# Patient Record
Sex: Male | Born: 1955 | Race: White | Hispanic: No | Marital: Married | Smoking: Former smoker
Health system: Southern US, Community
[De-identification: ages and names within clinical notes are randomized; demographics above are authoritative.]

## PROBLEM LIST (undated history)

## (undated) DIAGNOSIS — E669 Obesity, unspecified: Secondary | ICD-10-CM

## (undated) DIAGNOSIS — I10 Essential (primary) hypertension: Secondary | ICD-10-CM

## (undated) DIAGNOSIS — I779 Disorder of arteries and arterioles, unspecified: Secondary | ICD-10-CM

## (undated) DIAGNOSIS — G4730 Sleep apnea, unspecified: Secondary | ICD-10-CM

## (undated) DIAGNOSIS — I4891 Unspecified atrial fibrillation: Secondary | ICD-10-CM

## (undated) DIAGNOSIS — I499 Cardiac arrhythmia, unspecified: Secondary | ICD-10-CM

## (undated) DIAGNOSIS — J449 Chronic obstructive pulmonary disease, unspecified: Secondary | ICD-10-CM

## (undated) DIAGNOSIS — N200 Calculus of kidney: Secondary | ICD-10-CM

## (undated) DIAGNOSIS — K635 Polyp of colon: Secondary | ICD-10-CM

## (undated) DIAGNOSIS — E785 Hyperlipidemia, unspecified: Secondary | ICD-10-CM

## (undated) HISTORY — DX: Chronic obstructive pulmonary disease, unspecified: J44.9

## (undated) HISTORY — DX: Unspecified atrial fibrillation: I48.91

## (undated) HISTORY — DX: Cardiac arrhythmia, unspecified: I49.9

## (undated) HISTORY — DX: Essential (primary) hypertension: I10

## (undated) HISTORY — DX: Polyp of colon: K63.5

## (undated) HISTORY — PX: APPENDECTOMY: SHX54

## (undated) HISTORY — DX: Disorder of arteries and arterioles, unspecified: I77.9

## (undated) HISTORY — DX: Sleep apnea, unspecified: G47.30

## (undated) HISTORY — PX: LITHOTRIPSY: SUR834

## (undated) HISTORY — PX: COLONOSCOPY: SHX174

---

## 2014-06-05 DIAGNOSIS — N400 Enlarged prostate without lower urinary tract symptoms: Secondary | ICD-10-CM | POA: Insufficient documentation

## 2014-06-05 DIAGNOSIS — Z720 Tobacco use: Secondary | ICD-10-CM | POA: Insufficient documentation

## 2014-06-05 DIAGNOSIS — E785 Hyperlipidemia, unspecified: Secondary | ICD-10-CM | POA: Diagnosis present

## 2014-06-05 DIAGNOSIS — K219 Gastro-esophageal reflux disease without esophagitis: Secondary | ICD-10-CM | POA: Insufficient documentation

## 2015-12-21 DIAGNOSIS — Z87891 Personal history of nicotine dependence: Secondary | ICD-10-CM

## 2015-12-21 HISTORY — DX: Personal history of nicotine dependence: Z87.891

## 2020-01-12 DIAGNOSIS — E6609 Other obesity due to excess calories: Secondary | ICD-10-CM | POA: Insufficient documentation

## 2020-01-12 DIAGNOSIS — E66812 Obesity, class 2: Secondary | ICD-10-CM | POA: Insufficient documentation

## 2021-05-29 ENCOUNTER — Observation Stay (HOSPITAL_BASED_OUTPATIENT_CLINIC_OR_DEPARTMENT_OTHER)
Admission: EM | Admit: 2021-05-29 | Discharge: 2021-05-31 | Disposition: A | Discharge status: Home / Self Care | Payer: Medicare Other | Attending: Family Medicine | Admitting: Family Medicine

## 2021-05-29 ENCOUNTER — Emergency Department (HOSPITAL_BASED_OUTPATIENT_CLINIC_OR_DEPARTMENT_OTHER): Payer: Medicare Other

## 2021-05-29 ENCOUNTER — Encounter (HOSPITAL_BASED_OUTPATIENT_CLINIC_OR_DEPARTMENT_OTHER): Payer: Self-pay

## 2021-05-29 ENCOUNTER — Other Ambulatory Visit: Payer: Self-pay

## 2021-05-29 DIAGNOSIS — R0602 Shortness of breath: Secondary | ICD-10-CM | POA: Diagnosis not present

## 2021-05-29 DIAGNOSIS — R002 Palpitations: Secondary | ICD-10-CM | POA: Diagnosis not present

## 2021-05-29 DIAGNOSIS — R7309 Other abnormal glucose: Secondary | ICD-10-CM | POA: Diagnosis not present

## 2021-05-29 DIAGNOSIS — Z20822 Contact with and (suspected) exposure to covid-19: Secondary | ICD-10-CM | POA: Diagnosis not present

## 2021-05-29 DIAGNOSIS — R7989 Other specified abnormal findings of blood chemistry: Secondary | ICD-10-CM | POA: Diagnosis present

## 2021-05-29 DIAGNOSIS — I483 Typical atrial flutter: Secondary | ICD-10-CM | POA: Diagnosis not present

## 2021-05-29 DIAGNOSIS — Z91040 Latex allergy status: Secondary | ICD-10-CM | POA: Diagnosis not present

## 2021-05-29 DIAGNOSIS — E785 Hyperlipidemia, unspecified: Secondary | ICD-10-CM | POA: Diagnosis not present

## 2021-05-29 DIAGNOSIS — I4891 Unspecified atrial fibrillation: Secondary | ICD-10-CM | POA: Insufficient documentation

## 2021-05-29 DIAGNOSIS — I4892 Unspecified atrial flutter: Secondary | ICD-10-CM | POA: Diagnosis not present

## 2021-05-29 DIAGNOSIS — J9811 Atelectasis: Secondary | ICD-10-CM | POA: Diagnosis not present

## 2021-05-29 HISTORY — DX: Obesity, unspecified: E66.9

## 2021-05-29 HISTORY — DX: Hyperlipidemia, unspecified: E78.5

## 2021-05-29 HISTORY — DX: Calculus of kidney: N20.0

## 2021-05-29 HISTORY — DX: Unspecified atrial flutter: I48.92

## 2021-05-29 LAB — CBC
HCT: 48.5 % (ref 39.0–52.0)
Hemoglobin: 16 g/dL (ref 13.0–17.0)
MCH: 29.9 pg (ref 26.0–34.0)
MCHC: 33 g/dL (ref 30.0–36.0)
MCV: 90.5 fL (ref 80.0–100.0)
Platelets: 253 10*3/uL (ref 150–400)
RBC: 5.36 MIL/uL (ref 4.22–5.81)
RDW: 13.5 % (ref 11.5–15.5)
WBC: 10.2 10*3/uL (ref 4.0–10.5)
nRBC: 0 % (ref 0.0–0.2)

## 2021-05-29 LAB — HEPATIC FUNCTION PANEL
ALT: 18 U/L (ref 0–44)
AST: 18 U/L (ref 15–41)
Albumin: 3.7 g/dL (ref 3.5–5.0)
Alkaline Phosphatase: 78 U/L (ref 38–126)
Bilirubin, Direct: 0.1 mg/dL (ref 0.0–0.2)
Indirect Bilirubin: 0.3 mg/dL (ref 0.3–0.9)
Total Bilirubin: 0.4 mg/dL (ref 0.3–1.2)
Total Protein: 6.5 g/dL (ref 6.5–8.1)

## 2021-05-29 LAB — RESP PANEL BY RT-PCR (FLU A&B, COVID) ARPGX2
Influenza A by PCR: NEGATIVE
Influenza B by PCR: NEGATIVE
SARS Coronavirus 2 by RT PCR: NEGATIVE

## 2021-05-29 LAB — BASIC METABOLIC PANEL
Anion gap: 8 (ref 5–15)
BUN: 23 mg/dL (ref 8–23)
CO2: 26 mmol/L (ref 22–32)
Calcium: 9.2 mg/dL (ref 8.9–10.3)
Chloride: 104 mmol/L (ref 98–111)
Creatinine, Ser: 1.08 mg/dL (ref 0.61–1.24)
GFR, Estimated: >60 mL/min (ref >60)
Glucose, Bld: 126 mg/dL — ABNORMAL HIGH (ref 70–99)
Potassium: 4.6 mmol/L (ref 3.5–5.1)
Sodium: 138 mmol/L (ref 135–145)

## 2021-05-29 LAB — BRAIN NATRIURETIC PEPTIDE: B Natriuretic Peptide: 285.6 pg/mL — ABNORMAL HIGH (ref 0.0–100.0)

## 2021-05-29 LAB — TROPONIN I (HIGH SENSITIVITY)
Troponin I (High Sensitivity): 7 ng/L (ref <18)
Troponin I (High Sensitivity): 6 ng/L (ref <18)

## 2021-05-29 LAB — MAGNESIUM: Magnesium: 2 mg/dL (ref 1.7–2.4)

## 2021-05-29 LAB — TSH: TSH: 6.227 u[IU]/mL — ABNORMAL HIGH (ref 0.350–4.500)

## 2021-05-29 MED ORDER — SODIUM CHLORIDE 0.9% FLUSH
3.0000 mL | Freq: Two times a day (BID) | INTRAVENOUS | Status: DC
  Administered 2021-05-29 – 2021-05-30 (×2): 3 mL via INTRAVENOUS

## 2021-05-29 MED ORDER — SODIUM CHLORIDE 0.9 % IV SOLN
250.0000 mL | INTRAVENOUS | Status: DC | PRN
  Administered 2021-05-31: 250 mL via INTRAVENOUS

## 2021-05-29 MED ORDER — APIXABAN 5 MG PO TABS
5.0000 mg | ORAL_TABLET | Freq: Two times a day (BID) | ORAL | Status: DC
  Administered 2021-05-29 – 2021-05-31 (×5): 5 mg via ORAL
  Filled 2021-05-29 (×4): qty 1

## 2021-05-29 MED ORDER — ONDANSETRON HCL 4 MG/2ML IJ SOLN: 4.0000 mg | Freq: Four times a day (QID) | INTRAMUSCULAR | Status: DC | PRN

## 2021-05-29 MED ORDER — DILTIAZEM HCL-DEXTROSE 125-5 MG/125ML-% IV SOLN (PREMIX)
5.0000 mg/h | INTRAVENOUS | Status: DC
  Administered 2021-05-29: 5 mg/h via INTRAVENOUS
  Administered 2021-05-30 – 2021-05-31 (×2): 10 mg/h via INTRAVENOUS
  Filled 2021-05-29 (×4): qty 125

## 2021-05-29 MED ORDER — FUROSEMIDE 10 MG/ML IJ SOLN
40.0000 mg | Freq: Once | INTRAMUSCULAR | Status: AC
  Administered 2021-05-29: 40 mg via INTRAVENOUS
  Filled 2021-05-29: qty 4

## 2021-05-29 MED ORDER — ACETAMINOPHEN 325 MG PO TABS
650.0000 mg | ORAL_TABLET | ORAL | Status: DC | PRN
Start: 2021-05-29 — End: 2021-05-31

## 2021-05-29 MED ORDER — ENOXAPARIN SODIUM 60 MG/0.6ML IJ SOSY
60.0000 mg | PREFILLED_SYRINGE | INTRAMUSCULAR | Status: DC
  Administered 2021-05-29: 60 mg via SUBCUTANEOUS
  Filled 2021-05-29 (×2): qty 0.6

## 2021-05-29 MED ORDER — SODIUM CHLORIDE 0.9% FLUSH: 3.0000 mL | INTRAVENOUS | Status: DC | PRN

## 2021-05-29 NOTE — ED Triage Notes (Signed)
Pt reports ongoing SOB with palpitations for approximately one week. States the palpitations woke him up at approximately 0730. Denies N/V, diaphoresis, hx of afib. Endorses bilateral feet swelling and epigastric pain.

## 2021-05-29 NOTE — Progress Notes (Signed)
Received a phone call from Facility: [facility]  Requesting MD: Dr. Rush Landmark Patient with h/o BPH, GERD, HLD presenting with shortness of breath, leg swelling, fatigue, palpitations found to be in new aflutter with RVR with rate of 140. Started on dilt drip and HR down to 110. Troponin negative x 1. BNP: 285 and looks edematous.  Plan of care: admit to tele, cardiology already consulted (Dr. Mayford Knife).  I Asked EDP to give one dose of lasix.   Vitals stable with 98% on RR, RR: 13 and HR down to 111.   The patient will be accepted for admission to telemetry at [facility] when bed is available.   Nursing staff, Please call the [facility] Admits & Consults System-Wide number at the top of Amion at the time of the patient's arrival so that the patient can be paged to the admitting physician.   Lanney Gins M.D. [facility]

## 2021-05-29 NOTE — Consult Note (Addendum)
Cardiology Consult    Patient ID: Richard Vazquez MRN: [id number], DOB/AGE: [birth year]   Admit date: 05/29/2021 Date of Consult: 05/29/2021 Requesting Provider: Orland Mustard, MD  PCP:  Katherine Basset, PA-C Cardiologist:  None   {    Patient Profile    Richard Vazquez is a 65 y.o. male with a history of nephrolithiasis requiring surgical intervention, pre-diabetes (A1c 6.1%), anemia, and hyperlipidemia. He is being seen today for the evaluation of atrial flutter.  History of Present Illness    Woke up this morning at 7am with orthopnea/dyspnea, heaviness in his chest, and palpitations like his heart was racing. He has had similar but much more minor episodes last night in particular, but also occasionally while at work for the past few months. He has also noted leg swelling for the past year, worse in the left leg which he attributes to varicose veins, and exertional dyspnea/intolerance for a few months. He works a night shift and gets off around 2am. Denies any alcohol use, but has been sedentary and gained weight in his abdomen. Snores at night and often has un-refreshing sleep.   He went to the ED and was found to be in atrial flutter with a rate of 125 bpm. Mildly hypertensive, oxygenating well. BNP 285, TSH normal. CXR clear. Started on diltiazem infusion and transferred here for cardiology consultation.  Past Medical History   Past Medical History:  Diagnosis Date   Hyperlipemia    Kidney stones    Obesity     Past Surgical History:  Procedure Laterality Date   APPENDECTOMY       Allergies  Allergen Reactions   Codeine Itching   Latex Itching   Inpatient Medications     enoxaparin (LOVENOX) injection  60 mg Subcutaneous Q24H   sodium chloride flush  3 mL Intravenous Q12H    Family History    No family history on file. has no family status information on file.    Social History    Social History   Socioeconomic History   Marital status: Married    Spouse name:  Not on file   Number of children: Not on file   Years of education: Not on file   Highest education level: Not on file  Occupational History   Not on file  Tobacco Use   Smoking status: Not on file   Smokeless tobacco: Not on file  Substance and Sexual Activity   Alcohol use: Not on file   Drug use: Not on file   Sexual activity: Not on file  Other Topics Concern   Not on file  Social History Narrative   Not on file   Social Determinants of Health   Financial Resource Strain: Not on file  Food Insecurity: Not on file  Transportation Needs: Not on file  Physical Activity: Not on file  Stress: Not on file  Social Connections: Not on file  Intimate Partner Violence: Not on file     Review of Systems    A comprehensive review of systems was performed with pertinent positives and negatives noted in the HPI.  Physical Exam    Blood pressure 112/75, pulse 78, temperature 99 F (37.2 C), temperature source Oral, resp. rate 17, height 6\' 3"  (1.905 m), weight 122.5 kg, SpO2 94 %.     Intake/Output Summary (Last 24 hours) at 05/29/2021 1911 Last data filed at 05/29/2021 1828 Gross per 24 hour  Intake 840 ml  Output 2350 ml  Net -1510 ml  Wt Readings from Last 3 Encounters:  05/29/21 122.5 kg    CONSTITUTIONAL: alert and conversant, obese, no distress HEENT/NECK: no JVD, symmetric, no masses, no scleral icterus, pupils equal CARDIAC: irregular rhythm, AFL on monitor. Normal S1/S2, no S3/S4. No murmur. No friction rub. JVP normal.  VASCULAR: Radial pulses intact bilaterally. No carotid bruits. PULMONARY/CHEST WALL: very diminished breath sounds but otherwise clear, normal work of breathing ABDOMINAL: soft, non-tender, non-distended EXTREMITIES: 1+ LE edema (left slightly worse), no muscle atrophy, warm and well-perfused SKIN: Dry and intact. Slight venous stasis changes over distal lower extremities. No peripheral cyanosis. NEUROLOGIC: alert, no abnormal movements, cranial  nerves grossly intact. PSYCH: normal affect, normal speech and language   Labs    Recent Labs    05/29/21 1001 05/29/21 1200  TROPONINIHS 7 6   Lab Results  Component Value Date   WBC 10.2 05/29/2021   HGB 16.0 05/29/2021   HCT 48.5 05/29/2021   MCV 90.5 05/29/2021   PLT 253 05/29/2021    Recent Labs  Lab 05/29/21 1001  NA 138  K 4.6  CL 104  CO2 26  BUN 23  CREATININE 1.08  CALCIUM 9.2  PROT 6.5  BILITOT 0.4  ALKPHOS 78  ALT 18  AST 18  GLUCOSE 126*   No results found for: CHOL, HDL, LDLCALC, TRIG No results found for: DDIMER Recent Labs    05/29/21 1001  BNP 285.6*   No results for input(s): PROBNP in the last 8760 hours.    Radiology Studies    DG Chest Portable 1 View  Result Date: 05/29/2021 CLINICAL DATA:  Shortness of breath and palpitations for 1 week. EXAM: PORTABLE CHEST 1 VIEW COMPARISON:  None. FINDINGS: The heart size and mediastinal contours are within normal limits. There is mild left basilar atelectasis. The right lung is clear. There is no pleural effusion or pneumothorax. Degenerative changes are seen in the spine. IMPRESSION: No active disease. Electronically Signed   By: Romona Curls M.D.   On: 05/29/2021 11:19    ECG & Cardiac Imaging    ECG: typical atrial flutter with mostly 2:1 AV conduction - personally reviewed.  Assessment & Plan    Typical atrial flutter: new onset, now rate controlled on diltiazem ggt. CHADS-VASc is 2 for age and HTN, as he has had multiple pressures in the hypertensive range at clinic visits. He has multiple risk factors for atrial arrhythmias that need to be addressed prior to considering ablation, as he is high risk for conversion to AF. Given symptoms suggestive of other previous episodes, needs TEE.  - Can continue diltiazem infusion for now, okay for some permissive tachycardia if BP becomes limiting. - Start apixaban 5mg  BID and d/c enoxaparin - Agree with TFTs and echo, already ordered - NPO at  midnight for TEE/DCCV tomorrow - Refer for sleep study at discharge (high risk for OSA) - Stop omega 3 supplements (consistently associated with slightly higher risk of AF in RCTs) - Weight loss and exercise, discussed at length - Can be referred at for consideration of definitive CTI ablation at follow-up  Possible heart failure: reports exertional intolerance and lower extremity swelling over the last several months to a year. He reports improvement with lasix given in the ED and looks close to euvolemic with some residual lower extremity edema. May be related to atrial arrhythmias. Will await echo results. May benefit from some additional diuresis, but would hold off until after procedural sedation to avoid hypovolemia.   Hyperlipidemia: prior untreated  LDL was 160 with intermediate ASCVD risk. Has reported minor subjective intolerance to atorvastatin previously, and possibly lovastatin to a lesser degree. He is willing to retry lovastatin at 40mg . Also stressed importance of weight loss and exercise.    Signed, , MD 05/29/2021, 7:11 PM  For questions or updates, please contact   Please consult [email or web address] for contact info under Cardiology/STEMI.

## 2021-05-29 NOTE — H&P (Addendum)
History and Physical    Richard Vazquez WOE:[id number] DOB: 08-Jul-1956 DOA: 05/29/2021  PCP: Katherine Basset, PA-C Consultants:  [facility]: can't remember  Patient coming from: [facility]  lives at home with his wife and 2 kids   Chief Complaint: shortness of breath and palpitations .  HPI: Richard Vazquez is a 65 y.o. male with medical history significant of kidney stones and enlarged right kidney, hyperlipidemia who presented to ED with shortness of breath and palpitations. He states symptoms started yesterday with shortness of breath and palpitations that were on and off. It wasn't very severe and he wasn't very concerned. . This AM he went to the bathroom and had same symptoms witi palpitations and shortness of breath. He had a hard time catching his breath and thought his heart felt funny to the point that he told his wife they needed to go to ED.     He denies any fever/chills, chest pain, cough, stomach pain, N/V/D, headaches/vision changes, dysuria. Has chronic leg swelling that seems to be worse today. Also questionable orthopnea.    ED Course: Vitals: Febrile, blood pressure 140/97, heart rate 125, respiratory rate 20, oxygen 96% on room air. Pertinent labs: BNP 285.6, TSH 6.227, COVID flu negative Chest x-ray shows no active disease. In ED patient placed on diltiazem drip and cardiology was consulted.  [facility] was asked to admit.  Review of Systems: As per HPI; otherwise review of systems reviewed and negative.   Ambulatory Status:  Ambulates without assistance   Past Medical History:  Diagnosis Date   Hyperlipemia    Kidney stones    Obesity     Past Surgical History:  Procedure Laterality Date   APPENDECTOMY      Social History   Socioeconomic History   Marital status: Married    Spouse name: Not on file   Number of children: Not on file   Years of education: Not on file   Highest education level: Not on file  Occupational History   Not on file  Tobacco Use    Smoking status: Not on file   Smokeless tobacco: Not on file  Substance and Sexual Activity   Alcohol use: Not on file   Drug use: Not on file   Sexual activity: Not on file  Other Topics Concern   Not on file  Social History Narrative   Not on file   Social Determinants of Health   Financial Resource Strain: Not on file  Food Insecurity: Not on file  Transportation Needs: Not on file  Physical Activity: Not on file  Stress: Not on file  Social Connections: Not on file  Intimate Partner Violence: Not on file    Allergies  Allergen Reactions   Codeine Itching   Latex Itching    No family history on file.  Prior to Admission medications   Not on File    Physical Exam: Vitals:   05/29/21 1500 05/29/21 1515 05/29/21 1528 05/29/21 1702  BP: 114/73 113/72  112/75  Pulse: 78 94  78  Resp: 19 (!) 22  17  Temp:   98.3 F (36.8 C) 99 F (37.2 C)  TempSrc:   Oral Oral  SpO2: 95% 96%  94%  Weight:      Height:         General:  Appears calm and comfortable and is in NAD Eyes:  PERRL, EOMI, normal lids, iris ENT:  grossly normal hearing, lips & tongue, mmm; appropriate dentition Neck:  no LAD, masses or  thyromegaly; no carotid bruits Cardiovascular:  irregularly,irregular , no m/r/g. Bilateral lower leg edema 2-3+ to mid calf. Left >R.  Respiratory:   CTA bilaterally with no wheezes/rales/rhonchi.  Normal respiratory effort. Abdomen:  soft, NT, ND, NABS. Diastasis recti  Back:   normal alignment, no CVAT Skin:  no rash or induration seen on limited exam. Psoriasis like plaque over knuckles/arms Musculoskeletal:  grossly normal tone BUE/BLE, good ROM, no bony abnormality Lower extremity:   Limited foot exam with no ulcerations.  2+ distal pulses. Varicose vein on left lower calf.  Psychiatric:  grossly normal mood and affect, speech fluent and appropriate, AOx3 Neurologic:  CN 2-12 grossly intact, moves all extremities in coordinated fashion, sensation  intact    Radiological Exams on Admission: Independently reviewed - see discussion in A/P where applicable  DG Chest Portable 1 View  Result Date: 05/29/2021 CLINICAL DATA:  Shortness of breath and palpitations for 1 week. EXAM: PORTABLE CHEST 1 VIEW COMPARISON:  None. FINDINGS: The heart size and mediastinal contours are within normal limits. There is mild left basilar atelectasis. The right lung is clear. There is no pleural effusion or pneumothorax. Degenerative changes are seen in the spine. IMPRESSION: No active disease. Electronically Signed   By: Romona Curls M.D.   On: 05/29/2021 11:19    EKG: Independently reviewed.  Atrial flutter with rate 126, 2:1; nonspecific ST changes with no evidence of acute ischemia. No prior ekg for comparison    Labs on Admission: I have personally reviewed the available labs and imaging studies at the time of the admission.  Pertinent labs:   BNP 285.6,  TSH 6.227,  COVID flu negative Troponin negative x2   Assessment/Plan Principal Problem:   Atrial flutter with rapid ventricular response (HCC) -65 year old male presenting with shortness of breath and palpitations found to be in new onset atrial flutter with RVR -Admit to cardiac telemetry -Continue diltiazem drip for rate control responding well, continues to be in atrial flutter -Echocardiogram pending -CHA2DS2-VASc 2 score of 1: low moderate risk.  Discussed aspirin versus DOAC.  We will continue Lovenox DVT prophylaxis now and have cardiology follow-up on discussion of anticoagulation -Cardiology consulted and following -likely will need EP consult.   Active Problems:   Hyperlipemia -Fasting lipid panel in a.m. on no medication    Abnormal TSH- hypothyroid  -Adding on free T4 to a.m. labs -Repeat TSH in 6 to 8 weeks outpatient  Body mass index is 33.75 kg/m.   Level of care: Progressive DVT prophylaxis:  Lovenox  Code Status:  Full - confirmed with patient Family  Communication: his wife is at bedside: Lamar Benes  Disposition Plan:  The patient is from: home  Anticipated d/c is to: home   Requires inpatient hospitalization and is at significant risk of worsening, requires constant monitoring, assessment and MDM with specialists.  Patient is currently: stable.  Consults called: cardiology  Admission status:  observation   Dragon dictation used in completing this note.    Orland Mustard MD [facility]   How to contact the [facility] Attending or Consulting provider 7A - 7P or covering provider during after hours 7P -7A, for this patient?  Check the care team in [facility] and look for a) attending/consulting [facility] provider listed and b) the [facility] team listed Log into [email or web address] and use [facility] universal password to access. If you do not have the password, please contact the hospital operator. Locate the [facility] provider you are looking for under [facility] and page  to a number that you can be directly reached. If you still have difficulty reaching the provider, please page the [facility] (Director on Call) for the Hospitalists listed on amion for assistance.   05/29/2021, 5:55 PM

## 2021-05-29 NOTE — Progress Notes (Addendum)
Patient admitted to 4 E 12  VSS, call bell and phone within reach, patient assessed, cardiac monitor on and CCMD notified, admitting provider contacted.

## 2021-05-29 NOTE — ED Provider Notes (Signed)
[facility] EMERGENCY DEPARTMENT Provider Note   CSN: [id number] Arrival date & time: 05/29/21  0941     History Chief Complaint  Patient presents with   Palpitations   Shortness of Breath    Richard Vazquez is a 65 y.o. male.  The history is provided by the patient and medical records. No language interpreter was used.  Palpitations Palpitations quality:  Fast Onset quality:  Unable to specify Timing:  Intermittent Progression:  Waxing and waning Chronicity:  New Relieved by:  Nothing Worsened by:  Nothing Ineffective treatments:  None tried Associated symptoms: chest pain, chest pressure, lower extremity edema, malaise/fatigue and shortness of breath   Associated symptoms: no back pain, no cough, no diaphoresis, no dizziness, no hemoptysis, no nausea, no near-syncope, no numbness, no vomiting and no weakness   Shortness of Breath Severity:  Moderate Onset quality:  Gradual Duration:  2 days Timing:  Intermittent Progression:  Waxing and waning Chronicity:  New Relieved by:  Nothing Worsened by:  Nothing Ineffective treatments:  None tried Associated symptoms: chest pain   Associated symptoms: no abdominal pain, no cough, no diaphoresis, no fever, no headaches, no hemoptysis, no neck pain, no rash, no vomiting and no wheezing       Past Medical History:  Diagnosis Date   Hyperlipemia    Kidney stones     There are no problems to display for this patient.   Past Surgical History:  Procedure Laterality Date   APPENDECTOMY         No family history on file.     Home Medications Prior to Admission medications   Not on File    Allergies    Codeine and Latex  Review of Systems   Review of Systems  Constitutional:  Positive for fatigue and malaise/fatigue. Negative for chills, diaphoresis and fever.  HENT:  Negative for congestion.   Eyes:  Negative for visual disturbance.  Respiratory:  Positive for shortness of breath. Negative for cough,  hemoptysis, chest tightness and wheezing.   Cardiovascular:  Positive for chest pain and palpitations. Negative for leg swelling and near-syncope.  Gastrointestinal:  Negative for abdominal pain, constipation, diarrhea, nausea and vomiting.  Genitourinary:  Negative for dysuria, flank pain and frequency.  Musculoskeletal:  Negative for back pain, neck pain and neck stiffness.  Skin:  Negative for rash and wound.  Neurological:  Negative for dizziness, weakness, light-headedness, numbness and headaches.  Psychiatric/Behavioral:  Negative for agitation.   All other systems reviewed and are negative.  Physical Exam Updated Vital Signs BP (!) 140/97   Pulse (!) 125   Temp 98.2 F (36.8 C)   Resp 20   Ht 6\' 3"  (1.905 m)   Wt 122.5 kg   SpO2 96%   BMI 33.75 kg/m   Physical Exam Vitals and nursing note reviewed.  Constitutional:      General: He is not in acute distress.    Appearance: He is well-developed. He is not ill-appearing, toxic-appearing or diaphoretic.  HENT:     Head: Normocephalic and atraumatic.  Eyes:     Conjunctiva/sclera: Conjunctivae normal.     Pupils: Pupils are equal, round, and reactive to light.  Cardiovascular:     Rate and Rhythm: Tachycardia present. Rhythm irregular.     Heart sounds: No murmur heard. Pulmonary:     Effort: Pulmonary effort is normal. Tachypnea present. No respiratory distress.     Breath sounds: Rales present. No decreased breath sounds, wheezing or rhonchi.  Chest:  Chest wall: No tenderness.  Abdominal:     Palpations: Abdomen is soft.     Tenderness: There is no abdominal tenderness.  Musculoskeletal:     Cervical back: Neck supple.     Right lower leg: Edema present.     Left lower leg: Edema present.  Skin:    General: Skin is warm and dry.     Capillary Refill: Capillary refill takes less than 2 seconds.     Coloration: Skin is not pale.     Findings: No erythema.  Neurological:     General: No focal deficit  present.     Mental Status: He is alert.  Psychiatric:        Mood and Affect: Mood normal.    ED Results / Procedures / Treatments   Labs (all labs ordered are listed, but only abnormal results are displayed) Labs Reviewed  BASIC METABOLIC PANEL - Abnormal; Notable for the following components:      Result Value   Glucose, Bld 126 (*)    All other components within normal limits  BRAIN NATRIURETIC PEPTIDE - Abnormal; Notable for the following components:   B Natriuretic Peptide 285.6 (*)    All other components within normal limits  RESP PANEL BY RT-PCR (FLU A&B, COVID) ARPGX2  CBC  HEPATIC FUNCTION PANEL  MAGNESIUM  TSH  TROPONIN I (HIGH SENSITIVITY)  TROPONIN I (HIGH SENSITIVITY)    EKG EKG Interpretation  Date/Time:  Sunday May 29 2021 09:49:04 EDT Ventricular Rate:  126 PR Interval:    QRS Duration: 91 QT Interval:  327 QTC Calculation: 474 R Axis:   -20 Text Interpretation: Atrial flutter with 2:1 AV block RSR' in V1 or V2, right VCD or RVH Inferior infarct, age indeterminate No prior ECG for comparison. No STEMI Confirmed by Theda Belfast ([id number]) on 05/29/2021 9:57:23 AM  Radiology DG Chest Portable 1 View  Result Date: 05/29/2021 CLINICAL DATA:  Shortness of breath and palpitations for 1 week. EXAM: PORTABLE CHEST 1 VIEW COMPARISON:  None. FINDINGS: The heart size and mediastinal contours are within normal limits. There is mild left basilar atelectasis. The right lung is clear. There is no pleural effusion or pneumothorax. Degenerative changes are seen in the spine. IMPRESSION: No active disease. Electronically Signed   By: Romona Curls M.D.   On: 05/29/2021 11:19    Procedures Procedures   Medications Ordered in ED Medications  diltiazem (CARDIZEM) 125 mg in dextrose 5% 125 mL (1 mg/mL) infusion (7.5 mg/hr Intravenous Rate/Dose Change 05/29/21 1148)  furosemide (LASIX) injection 40 mg (40 mg Intravenous Given 05/29/21 1229)    ED Course  I have  reviewed the triage vital signs and the nursing notes.  Pertinent labs & imaging results that were available during my care of the patient were reviewed by me and considered in my medical decision making (see chart for details).    MDM Rules/Calculators/A&P                           Richard Vazquez is a 65 y.o. male with a past medical history significant for previous kidney stones, hyperlipidemia, and previous appendectomy who presents with a week or 2 of fatigue, occasional palpitations, worsening peripheral edema, and then 2 days of acute fast palpitations, chest pain, and worsened shortness of breath.  According to patient, he has not quite felt right for some time and has had occasional palpitations on and off with fatigue for the last  week or 2.  He reports has had edema worsening for the last month or 2 but denies any chest discomfort.  He reports that over the last 2 days he has had multiple episodes of fast palpitations and chest tightness and he has been unable to lay flat due to the shortness of breath.  Today it was worsened prompting him to come in.  On arrival, patient is found to be in A. fib with RVR with a rate in the 130s.  He denies any history of A. fib or a flutter.  He reports that he is not having chest discomfort right now but is still feeling short of breath.  Oxygen saturations were reassuring on initial evaluation.  Patient is denying any constipation, diarrhea, or urinary changes.  Denies fevers, chills, gesturing, or cough.  He denies any trauma.  Denies any other complaints on arrival.  On exam, patient does have significant edema in both legs which she reports is worsening.  He has significant rales in all lung fields but no acute murmur initially.  Good pulses in extremities.  Patient otherwise resting on arrival.  Clinically I am concerned that patient is having symptoms of shortness of breath, fatigue, and chest discomfort related to this new A. fib with RVR.  I suspect  his been in A. fib for some time given the report of fatigue, worsening edema, and occasional palpitations but has now been in RVR several times over the last few days.  We will get screening labs, chest x-ray, BNP, and has we do not have a hard start for when he began his A. fib, I do not feel he is a good candidate for ED cardioversion at this time.  We will start a diltiazem drip to help slow him down.  Due to his overall appearance and symptoms, I do suspect he will need admission.  Anticipate reassessment after work-up.  11:59 AM On the diltiazem drip, his rate is started to slow down to around 100-110.  It clearly shows a flutter like pattern on telemetry now.  COVID and flu negative.  Hepatic function not elevated.  CBC and metabolic panel reassuring.  Initial troponin negative, will trend.  Magnesium is normal.  TSH is still in process but BNP is elevated at 285.  This is likely corresponding to all the edema patient has.  I am concerned about the degree of new heart failure with this fluid overload and new A. fib.  Chest x-ray did not show pneumonia.  Will call cardiology to discuss the best place for patient be admitted for further management of new a flutter/fib with RVR with evidence of new heart failure.  Anticipate reassessment after discussion with cardiology.  12:50 PM Cardiology who feels that patient is appropriate for admission to medicine service and they will see for management.    Patient will be admitted to medicine for further management.  Lasix will be started for assistance with diuresis.    Final Clinical Impression(s) / ED Diagnoses Final diagnoses:  Atrial fibrillation with RVR (HCC)  Palpitations  Shortness of breath      Clinical Impression: 1. Atrial fibrillation with RVR (HCC)   2. Palpitations   3. Shortness of breath     Disposition: Discharge  Condition: Good  I have discussed the results, Dx and Tx plan with the pt(& family if present).  He/she/they expressed understanding and agree(s) with the plan. Discharge instructions discussed at great length. Strict return precautions discussed and pt &/or family have  verbalized understanding of the instructions. No further questions at time of discharge.    New Prescriptions   No medications on file    Follow Up: No follow-up provider specified.    Pedram Goodchild, Canary Brim, MD 05/29/21 1251

## 2021-05-30 ENCOUNTER — Other Ambulatory Visit (HOSPITAL_COMMUNITY): Payer: Self-pay

## 2021-05-30 ENCOUNTER — Observation Stay (HOSPITAL_BASED_OUTPATIENT_CLINIC_OR_DEPARTMENT_OTHER): Payer: Medicare Other

## 2021-05-30 ENCOUNTER — Encounter (HOSPITAL_COMMUNITY): Payer: Self-pay | Admitting: Family Medicine

## 2021-05-30 DIAGNOSIS — I4891 Unspecified atrial fibrillation: Secondary | ICD-10-CM | POA: Diagnosis not present

## 2021-05-30 DIAGNOSIS — I4892 Unspecified atrial flutter: Secondary | ICD-10-CM | POA: Diagnosis not present

## 2021-05-30 LAB — HEMOGLOBIN A1C
Hgb A1c MFr Bld: 6.1 % — ABNORMAL HIGH (ref 4.8–5.6)
Mean Plasma Glucose: 128.37 mg/dL

## 2021-05-30 LAB — CBC
HCT: 48 % (ref 39.0–52.0)
Hemoglobin: 15.8 g/dL (ref 13.0–17.0)
MCH: 30.2 pg (ref 26.0–34.0)
MCHC: 32.9 g/dL (ref 30.0–36.0)
MCV: 91.8 fL (ref 80.0–100.0)
Platelets: 279 10*3/uL (ref 150–400)
RBC: 5.23 MIL/uL (ref 4.22–5.81)
RDW: 13.5 % (ref 11.5–15.5)
WBC: 12.5 10*3/uL — ABNORMAL HIGH (ref 4.0–10.5)
nRBC: 0 % (ref 0.0–0.2)

## 2021-05-30 LAB — BASIC METABOLIC PANEL
Anion gap: 10 (ref 5–15)
BUN: 19 mg/dL (ref 8–23)
CO2: 27 mmol/L (ref 22–32)
Calcium: 9 mg/dL (ref 8.9–10.3)
Chloride: 100 mmol/L (ref 98–111)
Creatinine, Ser: 1.1 mg/dL (ref 0.61–1.24)
GFR, Estimated: >60 mL/min (ref >60)
Glucose, Bld: 119 mg/dL — ABNORMAL HIGH (ref 70–99)
Potassium: 3.6 mmol/L (ref 3.5–5.1)
Sodium: 137 mmol/L (ref 135–145)

## 2021-05-30 LAB — ECHOCARDIOGRAM COMPLETE
Height: 75 in
S' Lateral: 2.7 cm
Weight: 4347.2 oz

## 2021-05-30 LAB — HIV ANTIBODY (ROUTINE TESTING W REFLEX): HIV Screen 4th Generation wRfx: NONREACTIVE

## 2021-05-30 LAB — PROTIME-INR
INR: 1.1 (ref 0.8–1.2)
Prothrombin Time: 14.5 seconds (ref 11.4–15.2)

## 2021-05-30 LAB — T4, FREE: Free T4: 1.17 ng/dL — ABNORMAL HIGH (ref 0.61–1.12)

## 2021-05-30 MED ORDER — INFLUENZA VAC A&B SA ADJ QUAD 0.5 ML IM PRSY
0.5000 mL | PREFILLED_SYRINGE | INTRAMUSCULAR | Status: DC
  Filled 2021-05-30: qty 0.5

## 2021-05-30 MED ORDER — PNEUMOCOCCAL VAC POLYVALENT 25 MCG/0.5ML IJ INJ
0.5000 mL | INJECTION | INTRAMUSCULAR | Status: DC
  Filled 2021-05-30: qty 0.5

## 2021-05-30 NOTE — Discharge Instructions (Addendum)
  You will be scheduled for a sleep study from Dr. Charlott Rakes office.  If you have not heard in a week call the office [phone] and ask to speak to [facility]) she schedules these.      Information on my medicine - ELIQUIS (apixaban)  Why was Eliquis prescribed for you? Eliquis was prescribed for you to reduce the risk of a blood clot forming that can cause a stroke if you have a medical condition called atrial fibrillation (a type of irregular heartbeat).  What do You need to know about Eliquis ? Take your Eliquis TWICE DAILY - one tablet in the morning and one tablet in the evening with or without food. If you have difficulty swallowing the tablet whole please discuss with your pharmacist how to take the medication safely.  Take Eliquis exactly as prescribed by your doctor and DO NOT stop taking Eliquis without talking to the doctor who prescribed the medication.  Stopping may increase your risk of developing a stroke.  Refill your prescription before you run out.  After discharge, you should have regular check-up appointments with your healthcare provider that is prescribing your Eliquis.  In the future your dose may need to be changed if your kidney function or weight changes by a significant amount or as you get older.  What do you do if you miss a dose? If you miss a dose, take it as soon as you remember on the same day and resume taking twice daily.  Do not take more than one dose of ELIQUIS at the same time to make up a missed dose.  Important Safety Information A possible side effect of Eliquis is bleeding. You should call your healthcare provider right away if you experience any of the following: Bleeding from an injury or your nose that does not stop. Unusual colored urine (red or dark brown) or unusual colored stools (red or black). Unusual bruising for unknown reasons. A serious fall or if you hit your head (even if there is no bleeding).  Some medicines may  interact with Eliquis and might increase your risk of bleeding or clotting while on Eliquis. To help avoid this, consult your healthcare provider or pharmacist prior to using any new prescription or non-prescription medications, including herbals, vitamins, non-steroidal anti-inflammatory drugs (NSAIDs) and supplements.  This website has more information on Eliquis (apixaban): http://www.eliquis.com/eliquis/home

## 2021-05-30 NOTE — TOC Benefit Eligibility Note (Signed)
Patient Advocate Encounter  Insurance verification completed.    The patient is currently admitted and upon discharge could be taking Eliquis 5 mg.  The current 30 day co-pay is, $37.00.   The patient is currently admitted and upon discharge could be taking Xarelto 20 mg.  The current 30 day co-pay is, $37.00.   The patient is insured through [employer] [place] Medicare Part D     Jamaris Theard, CPhT Pharmacy Patient Advocate Specialist [place] [facility] Team Direct Number: [phone]  Fax: [phone]        

## 2021-05-30 NOTE — Progress Notes (Signed)
PROGRESS NOTE    Richard Vazquez  GUR:[id number] DOB: [birth year] DOA: 05/29/2021 PCP: Katherine Basset, PA-C   Brief Narrative:  This 65 years old male with PMH significant for kidney stones and enlarged right kidney, hyperlipidemia presented to the ED with complaints of intermittent shortness of breath and palpitations.  Patient reported waking up with intermittent palpitation it was not severe so he was not very concerned, later in the day it became worse,  he had to catch his breath and thought his heart felt funny so he came to the ED.  Patient also reported questionable orthopnea and chronic leg swelling.  Patient is found to have A. Flutter with RVR and started on Cardizem drip and cardiology was consulted.  Echocardiogram showed hyperdynamic EF 75%.  No valvular problems.  Cardiology recommended TEE with cardioversion tomorrow with at least 1 month of anticoagulation after procedure.  Assessment & Plan:   Principal Problem:   Atrial flutter with rapid ventricular response (HCC) Active Problems:   Hyperlipemia   Abnormal TSH  New onset atrial flutter with rapid ventricular response: Patient presented with shortness of breath and intermittent palpitations. He is found to have new onset atrial flutter with RVR. Patient is started on Cardizem drip for rate control,  heart rate is now controlled. Continue Cardizem drip for now , Patient is started on Eliquis for anticoagulation. Echocardiogram shows hyperdynamic EF 75%.  No valvular problems. Cardiology recommended TEE with cardioversion tomorrow. Patient reports feeling much improved.  Hyperlipidemia: Obtain lipid profile.  Abnormal TSH: Repeat full thyroid profile.   DVT prophylaxis: Eliquis Code Status: Full code Family Communication: No family at bedside Disposition Plan:   Status is: Observation  The patient remains OBS appropriate and will d/c before 2 midnights.  Dispo: The patient is from: Home               Anticipated d/c is to: Home              Patient currently is not medically stable to d/c.   Difficult to place patient No   Consultants:  Cardiology  Procedures: TTE  antimicrobials: No antibiotics  Subjective: Patient was seen and examined at bedside.  Overnight events noted.   He denies any chest pain.  He reports he is doing better and he is going to have a procedure today.  Objective: Vitals:   05/30/21 0605 05/30/21 0606 05/30/21 0754 05/30/21 1142  BP:   118/65 126/74  Pulse:   76 86  Resp: (!) 26 15 20 20   Temp:   98.7 F (37.1 C) 97.6 F (36.4 C)  TempSrc:   Oral Oral  SpO2:   95% 92%  Weight: 123.2 kg     Height:        Intake/Output Summary (Last 24 hours) at 05/30/2021 1355 Last data filed at 05/30/2021 1140 Gross per 24 hour  Intake 1431 ml  Output 3750 ml  Net -2319 ml   Filed Weights   05/29/21 0953 05/30/21 0605  Weight: 122.5 kg 123.2 kg    Examination:  General exam: Appears comfortable, not in any acute distress. Respiratory system: Clear to auscultation bilaterally, respiratory effort normal, RR 15 Cardiovascular system: S1 S2 heard, irregular rhythm, no murmur.   Gastrointestinal system: Abdomen is soft, nontender but distended, BS + Central nervous system: Alert and oriented X 1. No focal neurological deficits. Extremities: No edema, no cyanosis, no clubbing. Skin: No rashes, lesions or ulcers Psychiatry: Judgement and insight appear normal. Mood & affect appropriate.  Data Reviewed: I have personally reviewed following labs and imaging studies  CBC: Recent Labs  Lab 05/29/21 1001 05/30/21 0102  WBC 10.2 12.5*  HGB 16.0 15.8  HCT 48.5 48.0  MCV 90.5 91.8  PLT 253 279   Basic Metabolic Panel: Recent Labs  Lab 05/29/21 1001 05/30/21 0102  NA 138 137  K 4.6 3.6  CL 104 100  CO2 26 27  GLUCOSE 126* 119*  BUN 23 19  CREATININE 1.08 1.10  CALCIUM 9.2 9.0  MG 2.0  --    GFR: Estimated Creatinine Clearance: 94.7 mL/min  (by C-G formula based on SCr of 1.1 mg/dL). Liver Function Tests: Recent Labs  Lab 05/29/21 1001  AST 18  ALT 18  ALKPHOS 78  BILITOT 0.4  PROT 6.5  ALBUMIN 3.7   No results for input(s): LIPASE, AMYLASE in the last 168 hours. No results for input(s): AMMONIA in the last 168 hours. Coagulation Profile: Recent Labs  Lab 05/30/21 0102  INR 1.1   Cardiac Enzymes: No results for input(s): CKTOTAL, CKMB, CKMBINDEX, TROPONINI in the last 168 hours. BNP (last 3 results) No results for input(s): PROBNP in the last 8760 hours. HbA1C: Recent Labs    05/30/21 0102  HGBA1C 6.1*   CBG: No results for input(s): GLUCAP in the last 168 hours. Lipid Profile: No results for input(s): CHOL, HDL, LDLCALC, TRIG, CHOLHDL, LDLDIRECT in the last 72 hours. Thyroid Function Tests: Recent Labs    05/29/21 1001 05/30/21 0102  TSH 6.227*  --   FREET4  --  1.17*   Anemia Panel: No results for input(s): VITAMINB12, FOLATE, FERRITIN, TIBC, IRON, RETICCTPCT in the last 72 hours. Sepsis Labs: No results for input(s): PROCALCITON, LATICACIDVEN in the last 168 hours.  Recent Results (from the past 240 hour(s))  Resp Panel by RT-PCR (Flu A&B, Covid) Nasopharyngeal Swab     Status: None   Collection Time: 05/29/21 10:30 AM   Specimen: Nasopharyngeal Swab; Nasopharyngeal(NP) swabs in vial transport medium  Result Value Ref Range Status   SARS Coronavirus 2 by RT PCR NEGATIVE NEGATIVE Final    Comment: (NOTE) SARS-CoV-2 target nucleic acids are NOT DETECTED.  The SARS-CoV-2 RNA is generally detectable in upper respiratory specimens during the acute phase of infection. The lowest concentration of SARS-CoV-2 viral copies this assay can detect is 138 copies/mL. A negative result does not preclude SARS-Cov-2 infection and should not be used as the sole basis for treatment or other patient management decisions. A negative result may occur with  improper specimen collection/handling, submission of  specimen other than nasopharyngeal swab, presence of viral mutation(s) within the areas targeted by this assay, and inadequate number of viral copies(<138 copies/mL). A negative result must be combined with clinical observations, patient history, and epidemiological information. The expected result is Negative.  Fact Sheet for Patients:  BloggerCourse.com  Fact Sheet for Healthcare Providers:  SeriousBroker.it  This test is no t yet approved or cleared by the [place] FDA and  has been authorized for detection and/or diagnosis of SARS-CoV-2 by FDA under an Emergency Use Authorization (EUA). This EUA will remain  in effect (meaning this test can be used) for the duration of the COVID-19 declaration under Section 564(b)(1) of the Act, 21 U.S.C.section 360bbb-3(b)(1), unless the authorization is terminated  or revoked sooner.       Influenza A by PCR NEGATIVE NEGATIVE Final   Influenza B by PCR NEGATIVE NEGATIVE Final    Comment: (NOTE) The Xpert Xpress SARS-CoV-2/FLU/RSV plus assay  is intended as an aid in the diagnosis of influenza from Nasopharyngeal swab specimens and should not be used as a sole basis for treatment. Nasal washings and aspirates are unacceptable for Xpert Xpress SARS-CoV-2/FLU/RSV testing.  Fact Sheet for Patients: BloggerCourse.com  Fact Sheet for Healthcare Providers: SeriousBroker.it  This test is not yet approved or cleared by the [place] FDA and has been authorized for detection and/or diagnosis of SARS-CoV-2 by FDA under an Emergency Use Authorization (EUA). This EUA will remain in effect (meaning this test can be used) for the duration of the COVID-19 declaration under Section 564(b)(1) of the Act, 21 U.S.C. section 360bbb-3(b)(1), unless the authorization is terminated or revoked.  Performed at [facility], [address], [place], [place] [address]     Radiology Studies: DG Chest Portable 1 View  Result Date: 05/29/2021 CLINICAL DATA:  Shortness of breath and palpitations for 1 week. EXAM: PORTABLE CHEST 1 VIEW COMPARISON:  None. FINDINGS: The heart size and mediastinal contours are within normal limits. There is mild left basilar atelectasis. The right lung is clear. There is no pleural effusion or pneumothorax. Degenerative changes are seen in the spine. IMPRESSION: No active disease. Electronically Signed   By: Romona Curls M.D.   On: 05/29/2021 11:19   ECHOCARDIOGRAM COMPLETE  Result Date: 05/30/2021    ECHOCARDIOGRAM REPORT   Patient Name:   Richard Vazquez Date of Exam: 05/30/2021 Medical Rec #:  [id number]  Height:       75.0 in Accession #:    [id number] Weight:       271.7 lb Date of Birth:  Mar 20, 1956  BSA:          2.500 m Patient Age:    65 years   BP:           113/73 mmHg Patient Gender: M          HR:           98 bpm. Exam Location:  Inpatient Procedure: 2D Echo Indications:    atrial flutter  History:        Patient has no prior history of Echocardiogram examinations.                 Risk Factors:Dyslipidemia.  Sonographer:    Delcie Roch RDCS Referring Phys: [id number] Orland Mustard  Sonographer Comments: Technically difficult study due to poor echo windows. Image acquisition challenging due to respiratory motion. IMPRESSIONS  1. Left ventricular ejection fraction, by estimation, is >75%. The left ventricle has hyperdynamic function. The left ventricle has no regional wall motion abnormalities. There is mild left ventricular hypertrophy. Left ventricular diastolic parameters are indeterminate.  2. Right ventricular systolic function is normal. The right ventricular size is normal.  3. The mitral valve is normal in structure. No evidence of mitral valve regurgitation. No evidence of mitral stenosis.  4. The aortic valve is tricuspid. Aortic valve regurgitation is not visualized. Mild aortic valve sclerosis is  present, with no evidence of aortic valve stenosis.  5. The inferior vena cava is normal in size with greater than 50% respiratory variability, suggesting right atrial pressure of 3 mmHg. FINDINGS  Left Ventricle: Left ventricular ejection fraction, by estimation, is >75%. The left ventricle has hyperdynamic function. The left ventricle has no regional wall motion abnormalities. The left ventricular internal cavity size was normal in size. There is mild left ventricular hypertrophy. Left ventricular diastolic parameters are indeterminate. Right Ventricle: The right ventricular size is normal. Right  ventricular systolic function is normal. Left Atrium: Left atrial size was normal in size. Right Atrium: Right atrial size was normal in size. Pericardium: There is no evidence of pericardial effusion. Mitral Valve: The mitral valve is normal in structure. No evidence of mitral valve regurgitation. No evidence of mitral valve stenosis. Tricuspid Valve: The tricuspid valve is normal in structure. Tricuspid valve regurgitation is trivial. No evidence of tricuspid stenosis. Aortic Valve: The aortic valve is tricuspid. Aortic valve regurgitation is not visualized. Mild aortic valve sclerosis is present, with no evidence of aortic valve stenosis. Pulmonic Valve: The pulmonic valve was normal in structure. Pulmonic valve regurgitation is not visualized. No evidence of pulmonic stenosis. Aorta: The aortic root is normal in size and structure. Venous: The inferior vena cava is normal in size with greater than 50% respiratory variability, suggesting right atrial pressure of 3 mmHg. IAS/Shunts: No atrial level shunt detected by color flow Doppler.  LEFT VENTRICLE PLAX 2D LVIDd:         4.50 cm LVIDs:         2.70 cm LV PW:         1.10 cm LV IVS:        1.20 cm LVOT diam:     2.00 cm LVOT Area:     3.14 cm  RIGHT VENTRICLE             IVC RV S prime:     14.70 cm/s  IVC diam: 1.90 cm TAPSE (M-mode): 1.7 cm LEFT ATRIUM              Index       RIGHT ATRIUM           Index LA diam:        4.30 cm 1.72 cm/m  RA Area:     13.90 cm LA Vol (A2C):   53.1 ml 21.24 ml/m RA Volume:   33.00 ml  13.20 ml/m LA Vol (A4C):   53.8 ml 21.52 ml/m LA Biplane Vol: 59.2 ml 23.68 ml/m   AORTA Ao Root diam: 3.50 cm Ao Asc diam:  3.40 cm  SHUNTS Systemic Diam: 2.00 cm Olga Millers MD Electronically signed by Olga Millers MD Signature Date/Time: 05/30/2021/12:13:52 PM    Final     Scheduled Meds:  apixaban  5 mg Oral BID   sodium chloride flush  3 mL Intravenous Q12H   Continuous Infusions:  sodium chloride     diltiazem (CARDIZEM) infusion 10 mg/hr (05/30/21 0723)     LOS: 0 days    Time spent: 35 mins    Wilbon Obenchain, MD [facility]   If 7PM-7AM, please contact night-coverage

## 2021-05-30 NOTE — Progress Notes (Signed)
  Echocardiogram 2D Echocardiogram has been performed.  Delcie Roch 05/30/2021, 8:49 AM

## 2021-05-30 NOTE — Anesthesia Preprocedure Evaluation (Addendum)
Anesthesia Evaluation  Patient identified by MRN, date of birth, ID band Patient awake    Reviewed: Allergy & Precautions, H&P , NPO status , Patient's Chart, lab work & pertinent test results  Airway Mallampati: II  TM Distance: >3 FB Neck ROM: Full    Dental no notable dental hx. (+) Teeth Intact, Caps, Dental Advisory Given   Pulmonary former smoker,    Pulmonary exam normal breath sounds clear to auscultation       Cardiovascular Exercise Tolerance: Good Normal cardiovascular exam+ dysrhythmias Atrial Fibrillation  Rhythm:Regular Rate:Normal     Neuro/Psych negative neurological ROS  negative psych ROS   GI/Hepatic negative GI ROS, Neg liver ROS,   Endo/Other  Morbid obesity  Renal/GU Renal disease  negative genitourinary   Musculoskeletal negative musculoskeletal ROS (+)   Abdominal   Peds negative pediatric ROS (+)  Hematology negative hematology ROS (+)   Anesthesia Other Findings   Reproductive/Obstetrics negative OB ROS                            Anesthesia Physical Anesthesia Plan  ASA: 3  Anesthesia Plan: General   Post-op Pain Management:    Induction: Intravenous  PONV Risk Score and Plan: 1 and Propofol infusion  Airway Management Planned: Natural Airway, Nasal Cannula, Simple Face Mask and Mask  Additional Equipment: None  Intra-op Plan:   Post-operative Plan:   Informed Consent: I have reviewed the patients History and Physical, chart, labs and discussed the procedure including the risks, benefits and alternatives for the proposed anesthesia with the patient or authorized representative who has indicated his/her understanding and acceptance.       Plan Discussed with: Anesthesiologist and CRNA  Anesthesia Plan Comments:        Anesthesia Quick Evaluation

## 2021-05-30 NOTE — Progress Notes (Signed)
Progress Note  Patient Name: Richard Vazquez Date of Encounter: 05/30/2021  [facility] Cardiologist: New  Subjective   Pt deies CP  Breathing is better    No dizziness   Inpatient Medications    Scheduled Meds:  apixaban  5 mg Oral BID   sodium chloride flush  3 mL Intravenous Q12H   Continuous Infusions:  sodium chloride     diltiazem (CARDIZEM) infusion 10 mg/hr (05/30/21 0723)   PRN Meds: sodium chloride, acetaminophen, ondansetron (ZOFRAN) IV, sodium chloride flush   Vital Signs    Vitals:   05/30/21 0330 05/30/21 0605 05/30/21 0606 05/30/21 0754  BP: 113/73   118/65  Pulse: 67   76  Resp: 20 (!) 26 15 20   Temp: 98 F (36.7 C)   98.7 F (37.1 C)  TempSrc: Oral   Oral  SpO2: 91%   95%  Weight:  123.2 kg    Height:        Intake/Output Summary (Last 24 hours) at 05/30/2021 0916 Last data filed at 05/30/2021 0724 Gross per 24 hour  Intake 1191 ml  Output 4500 ml  Net -3309 ml   Last 3 Weights 05/30/2021 05/29/2021  Weight (lbs) 271 lb 11.2 oz 270 lb  Weight (kg) 123.242 kg 122.471 kg      Telemetry    Atrial flutter    Rates 80s   - Personally Reviewed  ECG    No new - Personally Reviewed  Physical Exam   GEN: No acute distress.   Neck: No JVD Cardiac: Irreg irrreg    No S3     Respiratory: Clear to auscultation bilaterally. GI: Soft, nontender, non-distended   Ventral hernaia    Easily reduces   MS: No edema; No deformity. Neuro:  Nonfocal  Psych: Normal affect   Labs    High Sensitivity Troponin:   Recent Labs  Lab 05/29/21 1001 05/29/21 1200  TROPONINIHS 7 6     Chemistry Recent Labs  Lab 05/29/21 1001 05/30/21 0102  NA 138 137  K 4.6 3.6  CL 104 100  CO2 26 27  GLUCOSE 126* 119*  BUN 23 19  CREATININE 1.08 1.10  CALCIUM 9.2 9.0  MG 2.0  --   PROT 6.5  --   ALBUMIN 3.7  --   AST 18  --   ALT 18  --   ALKPHOS 78  --   BILITOT 0.4  --   GFRNONAA >60 >60  ANIONGAP 8 10    Lipids No results for input(s): CHOL, TRIG,  HDL, LABVLDL, LDLCALC, CHOLHDL in the last 168 hours.  Hematology Recent Labs  Lab 05/29/21 1001 05/30/21 0102  WBC 10.2 12.5*  RBC 5.36 5.23  HGB 16.0 15.8  HCT 48.5 48.0  MCV 90.5 91.8  MCH 29.9 30.2  MCHC 33.0 32.9  RDW 13.5 13.5  PLT 253 279   Thyroid  Recent Labs  Lab 05/29/21 1001 05/30/21 0102  TSH 6.227*  --   FREET4  --  1.17*    BNP Recent Labs  Lab 05/29/21 1001  BNP 285.6*    DDimer No results for input(s): DDIMER in the last 168 hours.   Radiology    DG Chest Portable 1 View  Result Date: 05/29/2021 CLINICAL DATA:  Shortness of breath and palpitations for 1 week. EXAM: PORTABLE CHEST 1 VIEW COMPARISON:  None. FINDINGS: The heart size and mediastinal contours are within normal limits. There is mild left basilar atelectasis. The right lung is clear.  There is no pleural effusion or pneumothorax. Degenerative changes are seen in the spine. IMPRESSION: No active disease. Electronically Signed   By: Romona Curls M.D.   On: 05/29/2021 11:19    Cardiac Studies   Echo this am    Patient Profile     65 y.o. male with hx of SOB who presents with new atrail flutter    Assessment & Plan    1  Atrial flutter   Pt with newly diagnosied atrial fluttter    No clear cut change when noted that he converted to flutter   Suspiciaous that he was in it for at least several days.      Now on diltiazem and anticoagulation   Rates scontrolled  Preliminary echo findings   LVEF and RVEF normal   Normal atrial size   No valvular problems     Plan for TEE cardioversion tomorrow am with at least 1 month of anticoagulation after      2  HCM   Pt is obese  Discussed diet   36 gram Sugar, TRE    Last lipids in 2021  LDL 115, HDL 31, Trig 107 HDL 31  AGain diet  Follow     Probable d/c tomorrow after procedure     Risks / Benefits described.   Pt understands and agrees to proceed.     For questions or updates, please contact [facility] Please consult [email or web address] for  contact info under        Signed, Dietrich Pates, MD  05/30/2021, 9:16 AM

## 2021-05-31 ENCOUNTER — Other Ambulatory Visit (HOSPITAL_COMMUNITY): Payer: Self-pay

## 2021-05-31 ENCOUNTER — Observation Stay (HOSPITAL_COMMUNITY): Payer: Medicare Other | Admitting: Anesthesiology

## 2021-05-31 ENCOUNTER — Observation Stay (HOSPITAL_BASED_OUTPATIENT_CLINIC_OR_DEPARTMENT_OTHER): Payer: Medicare Other

## 2021-05-31 ENCOUNTER — Encounter (HOSPITAL_COMMUNITY): Payer: Self-pay | Admitting: Family Medicine

## 2021-05-31 ENCOUNTER — Encounter (HOSPITAL_COMMUNITY)
Admission: EM | Disposition: A | Discharge status: Home / Self Care | Payer: Self-pay | Source: Home / Self Care | Attending: Emergency Medicine

## 2021-05-31 ENCOUNTER — Telehealth: Payer: Self-pay | Admitting: Internal Medicine

## 2021-05-31 DIAGNOSIS — I4891 Unspecified atrial fibrillation: Secondary | ICD-10-CM | POA: Diagnosis not present

## 2021-05-31 DIAGNOSIS — Z91040 Latex allergy status: Secondary | ICD-10-CM | POA: Diagnosis not present

## 2021-05-31 DIAGNOSIS — I4892 Unspecified atrial flutter: Secondary | ICD-10-CM | POA: Diagnosis not present

## 2021-05-31 DIAGNOSIS — Z20822 Contact with and (suspected) exposure to covid-19: Secondary | ICD-10-CM | POA: Diagnosis not present

## 2021-05-31 HISTORY — PX: CARDIOVERSION: SHX1299

## 2021-05-31 HISTORY — PX: TEE WITHOUT CARDIOVERSION: SHX5443

## 2021-05-31 LAB — BASIC METABOLIC PANEL
Anion gap: 8 (ref 5–15)
BUN: 17 mg/dL (ref 8–23)
CO2: 25 mmol/L (ref 22–32)
Calcium: 8.8 mg/dL — ABNORMAL LOW (ref 8.9–10.3)
Chloride: 103 mmol/L (ref 98–111)
Creatinine, Ser: 1.1 mg/dL (ref 0.61–1.24)
GFR, Estimated: >60 mL/min (ref >60)
Glucose, Bld: 123 mg/dL — ABNORMAL HIGH (ref 70–99)
Potassium: 4 mmol/L (ref 3.5–5.1)
Sodium: 136 mmol/L (ref 135–145)

## 2021-05-31 LAB — CBC
HCT: 45.7 % (ref 39.0–52.0)
Hemoglobin: 15.4 g/dL (ref 13.0–17.0)
MCH: 30.4 pg (ref 26.0–34.0)
MCHC: 33.7 g/dL (ref 30.0–36.0)
MCV: 90.1 fL (ref 80.0–100.0)
Platelets: 239 10*3/uL (ref 150–400)
RBC: 5.07 MIL/uL (ref 4.22–5.81)
RDW: 13.3 % (ref 11.5–15.5)
WBC: 11.3 10*3/uL — ABNORMAL HIGH (ref 4.0–10.5)
nRBC: 0 % (ref 0.0–0.2)

## 2021-05-31 LAB — MAGNESIUM: Magnesium: 2 mg/dL (ref 1.7–2.4)

## 2021-05-31 LAB — PHOSPHORUS: Phosphorus: 4 mg/dL (ref 2.5–4.6)

## 2021-05-31 SURGERY — ECHOCARDIOGRAM, TRANSESOPHAGEAL
Anesthesia: General

## 2021-05-31 MED ORDER — APIXABAN 5 MG PO TABS
5.0000 mg | ORAL_TABLET | Freq: Two times a day (BID) | ORAL | 0 refills | Status: DC
  Filled 2021-05-31: qty 60, 30d supply, fill #0

## 2021-05-31 MED ORDER — SODIUM CHLORIDE 0.9 % IV SOLN: INTRAVENOUS | Status: DC | PRN

## 2021-05-31 MED ORDER — PROPOFOL 500 MG/50ML IV EMUL
INTRAVENOUS | Status: DC | PRN
  Administered 2021-05-31: 175 ug/kg/min via INTRAVENOUS

## 2021-05-31 MED ORDER — PROPOFOL 10 MG/ML IV BOLUS
INTRAVENOUS | Status: DC | PRN
  Administered 2021-05-31: 30 mg via INTRAVENOUS

## 2021-05-31 MED ORDER — BUTAMBEN-TETRACAINE-BENZOCAINE 2-2-14 % EX AERO
INHALATION_SPRAY | CUTANEOUS | Status: DC | PRN
  Administered 2021-05-31: 2 via TOPICAL

## 2021-05-31 MED ORDER — DILTIAZEM HCL ER COATED BEADS 180 MG PO CP24
180.0000 mg | ORAL_CAPSULE | Freq: Every day | ORAL | 1 refills | Status: DC
  Filled 2021-05-31: qty 30, 30d supply, fill #0

## 2021-05-31 MED ORDER — DILTIAZEM HCL ER COATED BEADS 180 MG PO CP24
180.0000 mg | ORAL_CAPSULE | Freq: Every day | ORAL | Status: DC
  Administered 2021-05-31: 180 mg via ORAL
  Filled 2021-05-31: qty 1

## 2021-05-31 NOTE — Progress Notes (Signed)
  Echocardiogram Echocardiogram Transesophageal has been performed.  Augustine Radar 05/31/2021, 8:19 AM

## 2021-05-31 NOTE — Anesthesia Postprocedure Evaluation (Signed)
Anesthesia Post Note  Patient: Richard Vazquez  Procedure(s) Performed: TRANSESOPHAGEAL ECHOCARDIOGRAM (TEE) CARDIOVERSION     Patient location during evaluation: PACU Anesthesia Type: General Level of consciousness: awake and alert Pain management: pain level controlled Vital Signs Assessment: post-procedure vital signs reviewed and stable Respiratory status: spontaneous breathing, nonlabored ventilation, respiratory function stable and patient connected to nasal cannula oxygen Cardiovascular status: blood pressure returned to baseline and stable Postop Assessment: no apparent nausea or vomiting Anesthetic complications: no   No notable events documented.  Last Vitals:  Vitals:   05/31/21 0819 05/31/21 0832  BP: 122/71 108/72  Pulse: 74 76  Resp: 18 16  Temp:  36.4 C  SpO2: 94% 94%    Last Pain:  Vitals:   05/31/21 0832  TempSrc: Oral  PainSc:                  Alexxa Sabet

## 2021-05-31 NOTE — Discharge Summary (Signed)
Physician Discharge Summary  Edoardo Laforte XKG:[id number] DOB: [birth year] DOA: 05/29/2021  PCP: Katherine Basset, PA-C  Admit date: 05/29/2021  Discharge date: 05/31/2021  Admitted From: Home.  Disposition:  Home  Recommendations for Outpatient Follow-up:  Follow up with PCP in 1-2 weeks. Please obtain BMP/CBC in one week. Advised to follow-up with cardiology in 1 month. Advised home sleep studies. Advised to take Eliquis for 1 month following successful cardioversion of A. fib into NSR. Advised to take Cardizem 180 mg daily for heart rate control.  Home Health:None Equipment/Devices: None  Discharge Condition: Stable CODE STATUS:Full code Diet recommendation: Heart Healthy  Brief [facility] Course: This 65 years old male with PMH significant for kidney stones and enlarged right kidney, hyperlipidemia presented to the ED with complaints of intermittent shortness of breath and palpitations.  Patient reported waking up with intermittent palpitation,  it was not severe so he was not very concerned, later in the day it became worse,  he had to catch his breath and thought his heart felt funny so he came to the ED.  Patient also reported questionable orthopnea and chronic leg swelling.  Patient is found to have A. Flutter with RVR and started on Cardizem drip and cardiology was consulted.  Echocardiogram showed hyperdynamic EF 75%.  No valvular problems.  Cardiology recommended TEE with cardioversion with at least 1 month of anticoagulation after procedure.  Patient underwent successful TEE with cardioversion of A. fib into NSR.  Patient tolerated well he feels better and want to be discharged.  Cardiologist recommended patient can be discharged on Cardizem 180 mg for heart rate control and Eliquis 5 mg twice daily for 1 month following cardioversion.  Patient also needs home sleep studies for sleep apnea.  Patient is being discharged home.  He was managed for below problems.  Discharge  Diagnoses:  Principal Problem:   Atrial flutter with rapid ventricular response (HCC) Active Problems:   Hyperlipemia   Abnormal TSH  New onset atrial flutter with rapid ventricular response: Patient presented with shortness of breath and intermittent palpitations. He is found to have new onset atrial flutter with RVR. Patient is started on Cardizem drip for rate control,  heart rate is now controlled. Continue Cardizem drip for now , Patient is started on Eliquis for anticoagulation. Echocardiogram shows hyperdynamic EF 75%.  No valvular problems. Patient underwent successful TEE with cardioversion of A. fib into NSR. Patient reports feeling much improved.  Patient want to be discharged. Patient is cleared from cardiology to be discharged.  On Eliquis for 1 month.   Hyperlipidemia: Follow lipid profile   Abnormal TSH: Repeat full thyroid profile  Discharge Instructions  Discharge Instructions     Call MD for:  difficulty breathing, headache or visual disturbances   Complete by: As directed    Call MD for:  persistant dizziness or light-headedness   Complete by: As directed    Call MD for:  persistant nausea and vomiting   Complete by: As directed    Diet - low sodium heart healthy   Complete by: As directed    Discharge instructions   Complete by: As directed    Advised to follow-up with PCP in 1 week. Advised to follow-up with cardiology in 1 month. Advised home sleep studies. Advised to take Eliquis for 1 month following successful cardioversion of A. fib into NSR. Advised to take Cardizem 180 mg daily for heart rate control.   Increase activity slowly   Complete by: As directed  Allergies as of 05/31/2021       Reactions   Codeine Itching   Latex Itching        Medication List     TAKE these medications    aspirin-acetaminophen-caffeine 250-250-65 MG tablet Commonly known as: EXCEDRIN MIGRAINE Take 2 tablets by mouth every 6 (six) hours as needed  for headache.   bisacodyl 5 MG EC tablet Commonly known as: DULCOLAX Take 5 mg by mouth as needed for moderate constipation.   Cartia XT 180 MG 24 hr capsule Generic drug: diltiazem Take 1 capsule (180 mg total) by mouth daily. Start taking on: June 01, 2021   Eliquis 5 MG Tabs tablet Generic drug: apixaban Take 1 tablet (5 mg total) by mouth 2 (two) times daily.   ibuprofen 200 MG tablet Commonly known as: ADVIL Take 200 mg by mouth every 6 (six) hours as needed for headache or mild pain.   Iron (Ferrous Sulfate) 325 (65 Fe) MG Tabs Take 1 tablet by mouth daily.   Omega-3 1000 MG Caps Take 2 capsules by mouth daily.   OVER THE COUNTER MEDICATION Take 2 capsules by mouth daily. Healthy Beet supplement   polycarbophil 625 MG tablet Commonly known as: FIBERCON Take 1,250 mg by mouth daily.   vitamin B-12 500 MCG tablet Commonly known as: CYANOCOBALAMIN Take 500 mcg by mouth daily.        Follow-up Information     Pricilla Riffle, MD Follow up on 06/22/2021.   Specialty: Cardiology Why: at 11:45 with her PA Jacolyn Reedy.  please try and come 15 min prior to appt to check in. Contact information: [address] [place] [address] [phone]         Katherine Basset, PA-C Follow up in 1 week(s).   Specialty: Family Medicine Contact information: [address] [place] [address] [phone]                Allergies  Allergen Reactions   Codeine Itching   Latex Itching    Consultations: Cardiology   Procedures/Studies: DG Chest Portable 1 View  Result Date: 05/29/2021 CLINICAL DATA:  Shortness of breath and palpitations for 1 week. EXAM: PORTABLE CHEST 1 VIEW COMPARISON:  None. FINDINGS: The heart size and mediastinal contours are within normal limits. There is mild left basilar atelectasis. The right lung is clear. There is no pleural effusion or pneumothorax. Degenerative changes are seen in the spine.  IMPRESSION: No active disease. Electronically Signed   By: Romona Curls M.D.   On: 05/29/2021 11:19   ECHOCARDIOGRAM COMPLETE  Result Date: 05/30/2021    ECHOCARDIOGRAM REPORT   Patient Name:   [facility] Date of Exam: 05/30/2021 Medical Rec #:  [id number]  Height:       75.0 in Accession #:    [id number] Weight:       271.7 lb Date of Birth:  January 29, 1956  BSA:          2.500 m Patient Age:    65 years   BP:           113/73 mmHg Patient Gender: M          HR:           98 bpm. Exam Location:  Inpatient Procedure: 2D Echo Indications:    atrial flutter  History:        Patient has no prior history of Echocardiogram examinations.  Risk Factors:Dyslipidemia.  Sonographer:    Delcie Roch RDCS Referring Phys: [id number] Orland Mustard  Sonographer Comments: Technically difficult study due to poor echo windows. Image acquisition challenging due to respiratory motion. IMPRESSIONS  1. Left ventricular ejection fraction, by estimation, is >75%. The left ventricle has hyperdynamic function. The left ventricle has no regional wall motion abnormalities. There is mild left ventricular hypertrophy. Left ventricular diastolic parameters are indeterminate.  2. Right ventricular systolic function is normal. The right ventricular size is normal.  3. The mitral valve is normal in structure. No evidence of mitral valve regurgitation. No evidence of mitral stenosis.  4. The aortic valve is tricuspid. Aortic valve regurgitation is not visualized. Mild aortic valve sclerosis is present, with no evidence of aortic valve stenosis.  5. The inferior vena cava is normal in size with greater than 50% respiratory variability, suggesting right atrial pressure of 3 mmHg. FINDINGS  Left Ventricle: Left ventricular ejection fraction, by estimation, is >75%. The left ventricle has hyperdynamic function. The left ventricle has no regional wall motion abnormalities. The left ventricular internal cavity size was normal in size. There  is mild left ventricular hypertrophy. Left ventricular diastolic parameters are indeterminate. Right Ventricle: The right ventricular size is normal. Right ventricular systolic function is normal. Left Atrium: Left atrial size was normal in size. Right Atrium: Right atrial size was normal in size. Pericardium: There is no evidence of pericardial effusion. Mitral Valve: The mitral valve is normal in structure. No evidence of mitral valve regurgitation. No evidence of mitral valve stenosis. Tricuspid Valve: The tricuspid valve is normal in structure. Tricuspid valve regurgitation is trivial. No evidence of tricuspid stenosis. Aortic Valve: The aortic valve is tricuspid. Aortic valve regurgitation is not visualized. Mild aortic valve sclerosis is present, with no evidence of aortic valve stenosis. Pulmonic Valve: The pulmonic valve was normal in structure. Pulmonic valve regurgitation is not visualized. No evidence of pulmonic stenosis. Aorta: The aortic root is normal in size and structure. Venous: The inferior vena cava is normal in size with greater than 50% respiratory variability, suggesting right atrial pressure of 3 mmHg. IAS/Shunts: No atrial level shunt detected by color flow Doppler.  LEFT VENTRICLE PLAX 2D LVIDd:         4.50 cm LVIDs:         2.70 cm LV PW:         1.10 cm LV IVS:        1.20 cm LVOT diam:     2.00 cm LVOT Area:     3.14 cm  RIGHT VENTRICLE             IVC RV S prime:     14.70 cm/s  IVC diam: 1.90 cm TAPSE (M-mode): 1.7 cm LEFT ATRIUM             Index       RIGHT ATRIUM           Index LA diam:        4.30 cm 1.72 cm/m  RA Area:     13.90 cm LA Vol (A2C):   53.1 ml 21.24 ml/m RA Volume:   33.00 ml  13.20 ml/m LA Vol (A4C):   53.8 ml 21.52 ml/m LA Biplane Vol: 59.2 ml 23.68 ml/m   AORTA Ao Root diam: 3.50 cm Ao Asc diam:  3.40 cm  SHUNTS Systemic Diam: 2.00 cm Olga Millers MD Electronically signed by Olga Millers MD Signature Date/Time: 05/30/2021/12:13:52 PM    Final  ECHO  TEE  Result Date: 05/31/2021    TRANSESOPHOGEAL ECHO REPORT   Patient Name:   SHUBH CHIARA Date of Exam: 05/31/2021 Medical Rec #:  [id number]  Height:       75.0 in Accession #:    [id number] Weight:       277.0 lb Date of Birth:  1956/03/16  BSA:          2.520 m Patient Age:    65 years   BP:           100/57 mmHg Patient Gender: M          HR:           109 bpm. Exam Location:  Inpatient Procedure: Transesophageal Echo, Color Doppler and Cardiac Doppler Indications:     I48.91* Unspeicified atrial fibrillation  History:         Patient has prior history of Echocardiogram examinations, most                  recent 05/30/2021. Risk Factors:Dyslipidemia.  Sonographer:     Eulah Pont RDCS Referring Phys:  [id number] PAULA V [name] Diagnosing Phys: Zoila Shutter MD PROCEDURE: After discussion of the risks and benefits of a TEE, an informed consent was obtained from the patient. The transesophogeal probe was passed without difficulty through the esophogus of the patient. Local oropharyngeal anesthetic was provided with Cetacaine. Sedation performed by different physician. The patient was monitored while under deep sedation. Anesthestetic sedation was provided intravenously by Anesthesiology: 205.84mg  of Propofol. The patient's vital signs; including heart rate, blood pressure, and oxygen saturation; remained stable throughout the procedure. The patient developed no complications during the procedure. IMPRESSIONS  1. Left ventricular ejection fraction, by estimation, is 65 to 70%. The left ventricle has normal function. The left ventricle has no regional wall motion abnormalities. There is mild left ventricular hypertrophy.  2. Right ventricular systolic function is normal. The right ventricular size is normal.  3. No left atrial/left atrial appendage thrombus was detected.  4. The mitral valve is grossly normal. Trivial mitral valve regurgitation.  5. The aortic valve is tricuspid. Aortic valve regurgitation is not  visualized. Conclusion(s)/Recommendation(s): No LA/LAA thrombus identified. Successful cardioversion performed with restoration of normal sinus rhythm. FINDINGS  Left Ventricle: Left ventricular ejection fraction, by estimation, is 65 to 70%. The left ventricle has normal function. The left ventricle has no regional wall motion abnormalities. The left ventricular internal cavity size was normal in size. There is  mild left ventricular hypertrophy. Right Ventricle: The right ventricular size is normal. No increase in right ventricular wall thickness. Right ventricular systolic function is normal. Left Atrium: Left atrial size was normal in size. No left atrial/left atrial appendage thrombus was detected. Right Atrium: Right atrial size was normal in size. Pericardium: There is no evidence of pericardial effusion. Mitral Valve: The mitral valve is grossly normal. Trivial mitral valve regurgitation. Tricuspid Valve: The tricuspid valve is grossly normal. Tricuspid valve regurgitation is trivial. Aortic Valve: The aortic valve is tricuspid. Aortic valve regurgitation is not visualized. Pulmonic Valve: The pulmonic valve was normal in structure. Pulmonic valve regurgitation is not visualized. Aorta: The aortic root and ascending aorta are structurally normal, with no evidence of dilitation. IAS/Shunts: The interatrial septum appears to be lipomatous. No atrial level shunt detected by color flow Doppler. MD Electronically signed by Zoila Shutter MD Signature Date/Time: 05/31/2021/9:40:51 AM    Final     Successful TEE with cardioversion of A. fib  into NSR   Subjective: Patient was seen and examined at bedside.  Overnight events noted.   Patient reports feeling much improved.  After cardioversion he feels better and wants to be discharged.  Discharge Exam: Vitals:   05/31/21 0819 05/31/21 0832  BP: 122/71 108/72  Pulse: 74 76  Resp: 18 16  Temp:  97.6 F (36.4 C)  SpO2: 94% 94%   Vitals:    05/31/21 0809 05/31/21 0810 05/31/21 0819 05/31/21 0832  BP: 101/61 101/61 122/71 108/72  Pulse: 74 71 74 76  Resp: Temp:    97.6 F (36.4 C)  TempSrc:    Oral  SpO2: 94% 95% 94% 94%  Weight:      Height:        General: Pt is alert, awake, not in acute distress Cardiovascular: RRR, S1/S2 +, no rubs, no gallops Respiratory: CTA bilaterally, no wheezing, no rhonchi Abdominal: Soft, NT, ND, bowel sounds + Extremities: no edema, no cyanosis    The results of significant diagnostics from this hospitalization (including imaging, microbiology, ancillary and laboratory) are listed below for reference.     Microbiology: Recent Results (from the past 240 hour(s))  Resp Panel by RT-PCR (Flu A&B, Covid) Nasopharyngeal Swab     Status: None   Collection Time: 05/29/21 10:30 AM   Specimen: Nasopharyngeal Swab; Nasopharyngeal(NP) swabs in vial transport medium  Result Value Ref Range Status   SARS Coronavirus 2 by RT PCR NEGATIVE NEGATIVE Final    Comment: (NOTE) SARS-CoV-2 target nucleic acids are NOT DETECTED.  The SARS-CoV-2 RNA is generally detectable in upper respiratory specimens during the acute phase of infection. The lowest concentration of SARS-CoV-2 viral copies this assay can detect is 138 copies/mL. A negative result does not preclude SARS-Cov-2 infection and should not be used as the sole basis for treatment or other patient management decisions. A negative result may occur with  improper specimen collection/handling, submission of specimen other than nasopharyngeal swab, presence of viral mutation(s) within the areas targeted by this assay, and inadequate number of viral copies(<138 copies/mL). A negative result must be combined with clinical observations, patient history, and epidemiological information. The expected result is Negative.  Fact Sheet for Patients:  BloggerCourse.com  Fact Sheet for Healthcare Providers:   SeriousBroker.it  This test is no t yet approved or cleared by the [place] FDA and  has been authorized for detection and/or diagnosis of SARS-CoV-2 by FDA under an Emergency Use Authorization (EUA). This EUA will remain  in effect (meaning this test can be used) for the duration of the COVID-19 declaration under Section 564(b)(1) of the Act, 21 U.S.C.section 360bbb-3(b)(1), unless the authorization is terminated  or revoked sooner.       Influenza A by PCR NEGATIVE NEGATIVE Final   Influenza B by PCR NEGATIVE NEGATIVE Final    Comment: (NOTE) The Xpert Xpress SARS-CoV-2/FLU/RSV plus assay is intended as an aid in the diagnosis of influenza from Nasopharyngeal swab specimens and should not be used as a sole basis for treatment. Nasal washings and aspirates are unacceptable for Xpert Xpress SARS-CoV-2/FLU/RSV testing.  Fact Sheet for Patients: BloggerCourse.com  Fact Sheet for Healthcare Providers: SeriousBroker.it  This test is not yet approved or cleared by the [place] FDA and has been authorized for detection and/or diagnosis of SARS-CoV-2 by FDA under an Emergency Use Authorization (EUA). This EUA will remain in effect (meaning this test can be used) for the duration of the COVID-19 declaration under Section  564(b)(1) of the Act, 21 U.S.C. section 360bbb-3(b)(1), unless the authorization is terminated or revoked.  Performed at [facility], [address] [place], [place] [address]      Labs: BNP (last 3 results) Recent Labs    05/29/21 1001  BNP 285.6*   Basic Metabolic Panel: Recent Labs  Lab 05/29/21 1001 05/30/21 0102 05/31/21 0119  NA 138 137 136  K 4.6 3.6 4.0  CL 104 100 103  CO2 GLUCOSE 126* 119* 123*  BUN CREATININE 1.08 1.10 1.10  CALCIUM 9.2 9.0 8.8*  MG 2.0  --  2.0  PHOS  --   --  4.0   Liver Function Tests: Recent  Labs  Lab 05/29/21 1001  AST 18  ALT 18  ALKPHOS 78  BILITOT 0.4  PROT 6.5  ALBUMIN 3.7   No results for input(s): LIPASE, AMYLASE in the last 168 hours. No results for input(s): AMMONIA in the last 168 hours. CBC: Recent Labs  Lab 05/29/21 1001 05/30/21 0102 05/31/21 0119  WBC 10.2 12.5* 11.3*  HGB 16.0 15.8 15.4  HCT 48.5 48.0 45.7  MCV 90.5 91.8 90.1  PLT 253 279 239   Cardiac Enzymes: No results for input(s): CKTOTAL, CKMB, CKMBINDEX, TROPONINI in the last 168 hours. BNP: Invalid input(s): POCBNP CBG: No results for input(s): GLUCAP in the last 168 hours. D-Dimer No results for input(s): DDIMER in the last 72 hours. Hgb A1c Recent Labs    05/30/21 0102  HGBA1C 6.1*   Lipid Profile No results for input(s): CHOL, HDL, LDLCALC, TRIG, CHOLHDL, LDLDIRECT in the last 72 hours. Thyroid function studies Recent Labs    05/29/21 1001  TSH 6.227*   Anemia work up No results for input(s): VITAMINB12, FOLATE, FERRITIN, TIBC, IRON, RETICCTPCT in the last 72 hours. Urinalysis No results found for: COLORURINE, APPEARANCEUR, LABSPEC, PHURINE, GLUCOSEU, HGBUR, BILIRUBINUR, KETONESUR, PROTEINUR, UROBILINOGEN, NITRITE, LEUKOCYTESUR Sepsis Labs Invalid input(s): PROCALCITONIN,  WBC,  LACTICIDVEN Microbiology Recent Results (from the past 240 hour(s))  Resp Panel by RT-PCR (Flu A&B, Covid) Nasopharyngeal Swab     Status: None   Collection Time: 05/29/21 10:30 AM   Specimen: Nasopharyngeal Swab; Nasopharyngeal(NP) swabs in vial transport medium  Result Value Ref Range Status   SARS Coronavirus 2 by RT PCR NEGATIVE NEGATIVE Final    Comment: (NOTE) SARS-CoV-2 target nucleic acids are NOT DETECTED.  The SARS-CoV-2 RNA is generally detectable in upper respiratory specimens during the acute phase of infection. The lowest concentration of SARS-CoV-2 viral copies this assay can detect is 138 copies/mL. A negative result does not preclude SARS-Cov-2 infection and should not be  used as the sole basis for treatment or other patient management decisions. A negative result may occur with  improper specimen collection/handling, submission of specimen other than nasopharyngeal swab, presence of viral mutation(s) within the areas targeted by this assay, and inadequate number of viral copies(<138 copies/mL). A negative result must be combined with clinical observations, patient history, and epidemiological information. The expected result is Negative.  Fact Sheet for Patients:  BloggerCourse.com  Fact Sheet for Healthcare Providers:  SeriousBroker.it  This test is no t yet approved or cleared by the [place] FDA and  has been authorized for detection and/or diagnosis of SARS-CoV-2 by FDA under an Emergency Use Authorization (EUA). This EUA will remain  in effect (meaning this test can be used) for the duration of the COVID-19 declaration under Section 564(b)(1) of the Act, 21  U.S.C.section 360bbb-3(b)(1), unless the authorization is terminated  or revoked sooner.       Influenza A by PCR NEGATIVE NEGATIVE Final   Influenza B by PCR NEGATIVE NEGATIVE Final    Comment: (NOTE) The Xpert Xpress SARS-CoV-2/FLU/RSV plus assay is intended as an aid in the diagnosis of influenza from Nasopharyngeal swab specimens and should not be used as a sole basis for treatment. Nasal washings and aspirates are unacceptable for Xpert Xpress SARS-CoV-2/FLU/RSV testing.  Fact Sheet for Patients: BloggerCourse.com  Fact Sheet for Healthcare Providers: SeriousBroker.it  This test is not yet approved or cleared by the [place] FDA and has been authorized for detection and/or diagnosis of SARS-CoV-2 by FDA under an Emergency Use Authorization (EUA). This EUA will remain in effect (meaning this test can be used) for the duration of the COVID-19 declaration under Section  564(b)(1) of the Act, 21 U.S.C. section 360bbb-3(b)(1), unless the authorization is terminated or revoked.  Performed at [facility], [address] [place], [place] [address]      Time coordinating discharge: Over 30 minutes  SIGNED:   Cipriano Bunker, MD  [facility] 05/31/2021, 3:13 PM Pager   If 7PM-7AM, please contact night-coverage

## 2021-05-31 NOTE — Telephone Encounter (Signed)
Patient states he would like a recommendation for a PCP. He says he is scheduled to see one tomorrow, but would like to switch because they were rude.

## 2021-05-31 NOTE — Progress Notes (Addendum)
Progress Note  Patient Name: Richard Vazquez Date of Encounter: 05/31/2021  [facility] Cardiologist: New  Subjective   Pt deies CP  Breathing is better    No dizziness   Inpatient Medications    Scheduled Meds:  apixaban  5 mg Oral BID   influenza vaccine adjuvanted  0.5 mL Intramuscular Tomorrow-1000   pneumococcal 23 valent vaccine  0.5 mL Intramuscular Tomorrow-1000   sodium chloride flush  3 mL Intravenous Q12H   Continuous Infusions:  sodium chloride 250 mL (05/31/21 0713)   diltiazem (CARDIZEM) infusion 10 mg/hr (05/31/21 0736)   PRN Meds: sodium chloride, acetaminophen, ondansetron (ZOFRAN) IV, sodium chloride flush   Vital Signs    Vitals:   05/31/21 0809 05/31/21 0810 05/31/21 0819 05/31/21 0832  BP: 101/61 101/61 122/71 108/72  Pulse: 74 71 74 76  Resp: 14 15 18 16   Temp:    97.6 F (36.4 C)  TempSrc:    Oral  SpO2: 94% 95% 94% 94%  Weight:      Height:        Intake/Output Summary (Last 24 hours) at 05/31/2021 1002 Last data filed at 05/31/2021 0758 Gross per 24 hour  Intake 780 ml  Output 1750 ml  Net -970 ml   Last 3 Weights 05/31/2021 05/30/2021 05/29/2021  Weight (lbs) 276 lb 15.3 oz 271 lb 11.2 oz 270 lb  Weight (kg) 125.627 kg 123.242 kg 122.471 kg      Telemetry    Atrial flutter    Rates 80s   - Personally Reviewed  ECG    No new - Personally Reviewed  Physical Exam   GEN: No acute distress.   Neck: No JVD Cardiac: Irreg irrreg    No S3     Respiratory: Clear to auscultation bilaterally. GI: Soft, nontender, non-distended   Ventral hernaia    Easily reduces   MS: No edema; No deformity. Neuro:  Nonfocal  Psych: Normal affect   Labs    High Sensitivity Troponin:   Recent Labs  Lab 05/29/21 1001 05/29/21 1200  TROPONINIHS 7 6     Chemistry Recent Labs  Lab 05/29/21 1001 05/30/21 0102 05/31/21 0119  NA 138 137 136  K 4.6 3.6 4.0  CL 104 100 103  CO2 26 27 25   GLUCOSE 126* 119* 123*  BUN 23 19 17   CREATININE 1.08  1.10 1.10  CALCIUM 9.2 9.0 8.8*  MG 2.0  --  2.0  PROT 6.5  --   --   ALBUMIN 3.7  --   --   AST 18  --   --   ALT 18  --   --   ALKPHOS 78  --   --   BILITOT 0.4  --   --   GFRNONAA >60 >60 >60  ANIONGAP 8 10 8     Lipids No results for input(s): CHOL, TRIG, HDL, LABVLDL, LDLCALC, CHOLHDL in the last 168 hours.  Hematology Recent Labs  Lab 05/29/21 1001 05/30/21 0102 05/31/21 0119  WBC 10.2 12.5* 11.3*  RBC 5.36 5.23 5.07  HGB 16.0 15.8 15.4  HCT 48.5 48.0 45.7  MCV 90.5 91.8 90.1  MCH 29.9 30.2 30.4  MCHC 33.0 32.9 33.7  RDW 13.5 13.5 13.3  PLT 253 279 239   Thyroid  Recent Labs  Lab 05/29/21 1001 05/30/21 0102  TSH 6.227*  --   FREET4  --  1.17*    BNP Recent Labs  Lab 05/29/21 1001  BNP 285.6*  DDimer No results for input(s): DDIMER in the last 168 hours.   Radiology    DG Chest Portable 1 View  Result Date: 05/29/2021 CLINICAL DATA:  Shortness of breath and palpitations for 1 week. EXAM: PORTABLE CHEST 1 VIEW COMPARISON:  None. FINDINGS: The heart size and mediastinal contours are within normal limits. There is mild left basilar atelectasis. The right lung is clear. There is no pleural effusion or pneumothorax. Degenerative changes are seen in the spine. IMPRESSION: No active disease. Electronically Signed   By: Romona Curls M.D.   On: 05/29/2021 11:19   ECHOCARDIOGRAM COMPLETE  Result Date: 05/30/2021    ECHOCARDIOGRAM REPORT   Patient Name:   [facility] Date of Exam: 05/30/2021 Medical Rec #:  [id number]  Height:       75.0 in Accession #:    [id number] Weight:       271.7 lb Date of Birth:  12-04-1955  BSA:          2.500 m Patient Age:    65 years   BP:           113/73 mmHg Patient Gender: M          HR:           98 bpm. Exam Location:  Inpatient Procedure: 2D Echo Indications:    atrial flutter  History:        Patient has no prior history of Echocardiogram examinations.                 Risk Factors:Dyslipidemia.  Sonographer:    Delcie Roch RDCS  Referring Phys: [id number] Orland Mustard  Sonographer Comments: Technically difficult study due to poor echo windows. Image acquisition challenging due to respiratory motion. IMPRESSIONS  1. Left ventricular ejection fraction, by estimation, is >75%. The left ventricle has hyperdynamic function. The left ventricle has no regional wall motion abnormalities. There is mild left ventricular hypertrophy. Left ventricular diastolic parameters are indeterminate.  2. Right ventricular systolic function is normal. The right ventricular size is normal.  3. The mitral valve is normal in structure. No evidence of mitral valve regurgitation. No evidence of mitral stenosis.  4. The aortic valve is tricuspid. Aortic valve regurgitation is not visualized. Mild aortic valve sclerosis is present, with no evidence of aortic valve stenosis.  5. The inferior vena cava is normal in size with greater than 50% respiratory variability, suggesting right atrial pressure of 3 mmHg. FINDINGS  Left Ventricle: Left ventricular ejection fraction, by estimation, is >75%. The left ventricle has hyperdynamic function. The left ventricle has no regional wall motion abnormalities. The left ventricular internal cavity size was normal in size. There is mild left ventricular hypertrophy. Left ventricular diastolic parameters are indeterminate. Right Ventricle: The right ventricular size is normal. Right ventricular systolic function is normal. Left Atrium: Left atrial size was normal in size. Right Atrium: Right atrial size was normal in size. Pericardium: There is no evidence of pericardial effusion. Mitral Valve: The mitral valve is normal in structure. No evidence of mitral valve regurgitation. No evidence of mitral valve stenosis. Tricuspid Valve: The tricuspid valve is normal in structure. Tricuspid valve regurgitation is trivial. No evidence of tricuspid stenosis. Aortic Valve: The aortic valve is tricuspid. Aortic valve regurgitation is not  visualized. Mild aortic valve sclerosis is present, with no evidence of aortic valve stenosis. Pulmonic Valve: The pulmonic valve was normal in structure. Pulmonic valve regurgitation is not visualized. No evidence of pulmonic stenosis. Aorta: The aortic root  is normal in size and structure. Venous: The inferior vena cava is normal in size with greater than 50% respiratory variability, suggesting right atrial pressure of 3 mmHg. IAS/Shunts: No atrial level shunt detected by color flow Doppler.  LEFT VENTRICLE PLAX 2D LVIDd:         4.50 cm LVIDs:         2.70 cm LV PW:         1.10 cm LV IVS:        1.20 cm LVOT diam:     2.00 cm LVOT Area:     3.14 cm  RIGHT VENTRICLE             IVC RV S prime:     14.70 cm/s  IVC diam: 1.90 cm TAPSE (M-mode): 1.7 cm LEFT ATRIUM             Index       RIGHT ATRIUM           Index LA diam:        4.30 cm 1.72 cm/m  RA Area:     13.90 cm LA Vol (A2C):   53.1 ml 21.24 ml/m RA Volume:   33.00 ml  13.20 ml/m LA Vol (A4C):   53.8 ml 21.52 ml/m LA Biplane Vol: 59.2 ml 23.68 ml/m   AORTA Ao Root diam: 3.50 cm Ao Asc diam:  3.40 cm  SHUNTS Systemic Diam: 2.00 cm Olga Millers MD Electronically signed by Olga Millers MD Signature Date/Time: 05/30/2021/12:13:52 PM    Final    ECHO TEE  Result Date: 05/31/2021    TRANSESOPHOGEAL ECHO REPORT   Patient Name:   [facility] Date of Exam: 05/31/2021 Medical Rec #:  [id number]  Height:       75.0 in Accession #:    [id number] Weight:       277.0 lb Date of Birth:  08-25-1956  BSA:          2.520 m Patient Age:    65 years   BP:           100/57 mmHg Patient Gender: M          HR:           109 bpm. Exam Location:  Inpatient Procedure: Transesophageal Echo, Color Doppler and Cardiac Doppler Indications:     I48.91* Unspeicified atrial fibrillation  History:         Patient has prior history of Echocardiogram examinations, most                  recent 05/30/2021. Risk Factors:Dyslipidemia.  Sonographer:     Eulah Pont RDCS Referring  Phys:  [id number] Kaliyah Gladman V Yuniel Blaney Diagnosing Phys: Zoila Shutter MD PROCEDURE: After discussion of the risks and benefits of a TEE, an informed consent was obtained from the patient. The transesophogeal probe was passed without difficulty through the esophogus of the patient. Local oropharyngeal anesthetic was provided with Cetacaine. Sedation performed by different physician. The patient was monitored while under deep sedation. Anesthestetic sedation was provided intravenously by Anesthesiology: 205.84mg  of Propofol. The patient's vital signs; including heart rate, blood pressure, and oxygen saturation; remained stable throughout the procedure. The patient developed no complications during the procedure. IMPRESSIONS  1. Left ventricular ejection fraction, by estimation, is 65 to 70%. The left ventricle has normal function. The left ventricle has no regional wall motion abnormalities. There is mild left ventricular hypertrophy.  2. Right ventricular systolic function is normal. The right  ventricular size is normal.  3. No left atrial/left atrial appendage thrombus was detected.  4. The mitral valve is grossly normal. Trivial mitral valve regurgitation.  5. The aortic valve is tricuspid. Aortic valve regurgitation is not visualized. Conclusion(s)/Recommendation(s): No LA/LAA thrombus identified. Successful cardioversion performed with restoration of normal sinus rhythm. FINDINGS  Left Ventricle: Left ventricular ejection fraction, by estimation, is 65 to 70%. The left ventricle has normal function. The left ventricle has no regional wall motion abnormalities. The left ventricular internal cavity size was normal in size. There is  mild left ventricular hypertrophy. Right Ventricle: The right ventricular size is normal. No increase in right ventricular wall thickness. Right ventricular systolic function is normal. Left Atrium: Left atrial size was normal in size. No left atrial/left atrial appendage thrombus was detected. Right  Atrium: Right atrial size was normal in size. Pericardium: There is no evidence of pericardial effusion. Mitral Valve: The mitral valve is grossly normal. Trivial mitral valve regurgitation. Tricuspid Valve: The tricuspid valve is grossly normal. Tricuspid valve regurgitation is trivial. Aortic Valve: The aortic valve is tricuspid. Aortic valve regurgitation is not visualized. Pulmonic Valve: The pulmonic valve was normal in structure. Pulmonic valve regurgitation is not visualized. Aorta: The aortic root and ascending aorta are structurally normal, with no evidence of dilitation. IAS/Shunts: The interatrial septum appears to be lipomatous. No atrial level shunt detected by color flow Doppler. Zoila Shutter MD Electronically signed by Zoila Shutter MD Signature Date/Time: 05/31/2021/9:40:51 AM    Final     Cardiac Studies   Echo this am    Patient Profile     65 y.o. male with hx of SOB who presents with new atrail flutter    Assessment & Plan    1  Atrial flutter   Pt with newly diagnosied atrial fluttter   He underwent TEE / Cardioversion today He will need at least 1 month, of Eliquis after.  Given that he did not snse palpitations I would consider checking with Zio patch after 1 month to see if recurrence   His CHADSVASc at present is 1    Will switch to 180 mg po dilt per day    2  ? Sleep apnea  Suspicious from responses to sedation taht pt has sleep apnea   Will set up for home evaluation  3  HCM   Pt is obese  Discussed diet   36 gram Sugar, TRE    Last lipids in 2021  LDL 115, HDL 31, Trig 107 HDL 31  AGain diet  Follow     OK to d/c    Follow up as outpt  Risks / Benefits described.   Pt understands and agrees to proceed.     For questions or updates, please contact [facility] Please consult [email or web address] for contact info under        Signed, Dietrich Pates, MD  05/31/2021, 10:02 AM

## 2021-05-31 NOTE — Transfer of Care (Signed)
Immediate Anesthesia Transfer of Care Note  Patient: Richard Vazquez  Procedure(s) Performed: TRANSESOPHAGEAL ECHOCARDIOGRAM (TEE) CARDIOVERSION  Patient Location: Endoscopy Unit  Anesthesia Type:MAC  Level of Consciousness: awake, drowsy and patient cooperative  Airway & Oxygen Therapy: Patient Spontanous Breathing and Patient connected to nasal cannula oxygen  Post-op Assessment: Report given to RN, Post -op Vital signs reviewed and stable and Patient moving all extremities X 4  Post vital signs: Reviewed and stable  Last Vitals:  Vitals Value Taken Time  BP    Temp    Pulse 80 05/31/21 0759  Resp 10 05/31/21 0759  SpO2 93 % 05/31/21 0759  Vitals shown include unvalidated device data.  Last Pain:  Vitals:   05/31/21 0706  TempSrc: Temporal  PainSc: 0-No pain         Complications: No notable events documented.

## 2021-05-31 NOTE — Telephone Encounter (Signed)
Called pt and gave him information for [facility] [phone].  I advised him this service is able to give him a list of PCP in his area.

## 2021-05-31 NOTE — Interval H&P Note (Signed)
History and Physical Interval Note:  05/31/2021 7:36 AM  Richard Vazquez  has presented today for surgery, with the diagnosis of afib.  The various methods of treatment have been discussed with the patient and family. After consideration of risks, benefits and other options for treatment, the patient has consented to  Procedure(s): TRANSESOPHAGEAL ECHOCARDIOGRAM (TEE) (N/A) CARDIOVERSION (N/A) as a surgical intervention.  The patient's history has been reviewed, patient examined, no change in status, stable for surgery.  I have reviewed the patient's chart and labs.  Questions were answered to the patient's satisfaction.     Chrystie Nose

## 2021-05-31 NOTE — CV Procedure (Signed)
TEE/CARDIOVERSION NOTE  TRANSESOPHAGEAL ECHOCARDIOGRAM (TEE):  Indictation: Atrial Flutter  Consent:   Informed consent was obtained prior to the procedure. The risks, benefits and alternatives for the procedure were discussed and the patient comprehended these risks.  Risks include, but are not limited to, cough, sore throat, vomiting, nausea, somnolence, esophageal and stomach trauma or perforation, bleeding, low blood pressure, aspiration, pneumonia, infection, trauma to the teeth and death.    Time Out: Verified patient identification, verified procedure, site/side was marked, verified correct patient position, special equipment/implants available, medications/allergies/relevent history reviewed, required imaging and test results available. Performed  Procedure:  After a procedural time-out, the patient was given propofol per anesthesia for moderate sedation. The patient's heart rate, blood pressure, and oxygen saturation are monitored continuously during the procedure. The oropharynx was anesthetized 2 sprays of topical cetacaine.  The transesophageal probe was inserted in the esophagus and stomach without difficulty and multiple views were obtained. Agitated microbubble saline contrast was not administered.  Complications:    Complications: None Patient did tolerate procedure well.  Findings:  LEFT VENTRICLE: The left ventricular wall thickness is mildly increased.  The left ventricular cavity is normal in size. Wall motion is hyperdynamice.  LVEF is 65-70%.  RIGHT VENTRICLE:  The right ventricle is normal in structure and function without any thrombus or masses.    LEFT ATRIUM:  The left atrium is normal in size without any thrombus or masses.  There is not spontaneous echo contrast ("smoke") in the left atrium consistent with a low flow state.  LEFT ATRIAL APPENDAGE:  The left atrial appendage is small and free of any thrombus or masses. The appendage has single lobes. Pulse  doppler indicates low flow in the appendage.  ATRIAL SEPTUM:  The atrial septum demonstrates marked lipomatous hypertrophy.  There is no evidence for interatrial shunting by color doppler.  RIGHT ATRIUM:  The right atrium is normal in size and function without any thrombus or masses.  MITRAL VALVE:  The mitral valve is normal in structure and function with  trivial  regurgitation.  There were no vegetations or stenosis.  AORTIC VALVE:  The aortic valve is trileaflet, normal in structure and function with  no  regurgitation.  There were no vegetations or stenosis  TRICUSPID VALVE:  The tricuspid valve is normal in structure and function with  trivial  regurgitation.  There were no vegetations or stenosis   PULMONIC VALVE:  The pulmonic valve is normal in structure and function with  no  regurgitation.  There were no vegetations or stenosis.   AORTIC ARCH, ASCENDING AND DESCENDING AORTA:  There was no Myrtis Ser et. Al, 1992) atherosclerosis of the ascending aorta, aortic arch, or proximal descending aorta.  12. PULMONARY VEINS: Anomalous pulmonary venous return was not noted.  13. PERICARDIUM: The pericardium appeared normal and non-thickened.  There is no pericardial effusion.  CARDIOVERSION:     Second Time Out: Verified patient identification, verified procedure, site/side was marked, verified correct patient position, special equipment/implants available, medications/allergies/relevent history reviewed, required imaging and test results available.  Performed  Procedure:  Patient placed on cardiac monitor, pulse oximetry, supplemental oxygen as necessary.  Sedation administered per anesthesia Pacer pads placed anterior and posterior chest. Cardioverted 1 time(s).  Cardioverted at 200J biphasic.  Complications:  Complications: None Patient did tolerate procedure well.  Impression:  No LAA thrombus Negative for PFO - lipomatous hypertrophy of the interatrial septum No significant  valve disease Normal biatrial size LVEF 65-70% with normal wall motion  Successful DCCV with a single 200J biphasic shock to NSR  Recommendations:  Sleep apnea is highly suspected given airway obstruction during anesthesia. Would recommend outpatient sleep evaluation.  Time Spent Directly with the Patient:  45 minutes   Chrystie Nose, MD, [facility], FACP  [place]  [facility]  Medical Director of the [facility]  [facility] Diplomate of the American Board of Clinical Lipidology Attending Cardiologist  Direct Dial: [phone]  Fax: [phone]  Website:  [email or web address] Nicely Shyonna Carlin 05/31/2021, 8:06 AM

## 2021-06-01 DIAGNOSIS — I4892 Unspecified atrial flutter: Secondary | ICD-10-CM | POA: Diagnosis not present

## 2021-06-01 DIAGNOSIS — Z9889 Other specified postprocedural states: Secondary | ICD-10-CM | POA: Diagnosis not present

## 2021-06-02 ENCOUNTER — Encounter (HOSPITAL_COMMUNITY): Payer: Self-pay | Admitting: Internal Medicine

## 2021-06-03 ENCOUNTER — Telehealth: Payer: Self-pay | Admitting: Pharmacist

## 2021-06-03 ENCOUNTER — Other Ambulatory Visit: Payer: Self-pay

## 2021-06-03 NOTE — Telephone Encounter (Signed)
Transitions of Care Pharmacy    Call attempted for a pharmacy transitions of care follow-up. Unable to leave message.   Call attempt #1. Will follow-up in 2-3 days.

## 2021-06-07 ENCOUNTER — Telehealth: Payer: Self-pay | Admitting: Internal Medicine

## 2021-06-07 NOTE — Telephone Encounter (Signed)
Pt calling to inform Dr. Tenny Craw that after taking medication urine doesn't look as dark meaning that the blood previously seen may be almost gone... please advise

## 2021-06-07 NOTE — Telephone Encounter (Signed)
Pt calls in to report that yesterday when he voided he experienced "real dark brown urine" a couple times, but was clear the day before and today. Reports hx of kidney stones and wonders if this was the cause as he did have pain on his right side in that area - which is similar as the past.   Pt advised to continue monitoring and if reoccurs to let us know.  Aware that most likely we would send pt to PCP to r/o other causes before considering stopping a bld thinner as this does not sound r/t  to it. However advised to let us know if reoccurs. Patient verbalized understanding and agreeable to plan.   Forwarding to MD for FYI and if different advisement for pt.

## 2021-06-07 NOTE — Telephone Encounter (Signed)
Reviewed note I saw the pt in hte hospital on 10/3   New onset aflutter    Cardioverted   SO.... important that he stay on blood thinner for 4 wks before oncsidering stopping Stay hydrated    Urine should be pale Could order a UA just to see if RBCs present WBC present Will forward then to PCP

## 2021-06-08 ENCOUNTER — Telehealth: Payer: Self-pay | Admitting: *Deleted

## 2021-06-08 DIAGNOSIS — I4892 Unspecified atrial flutter: Secondary | ICD-10-CM

## 2021-06-08 NOTE — Telephone Encounter (Signed)
Left a message for the pt to call back.  

## 2021-06-08 NOTE — Telephone Encounter (Signed)
Spoke with the pt and he reports that his urine has improved since he has been hydrating better and declined a UA at this time.. he says that he has a strong h/o kidney stones and has not seen his Urologist in some time so he will call today to see if he can make a follow up appt.

## 2021-06-08 NOTE — Telephone Encounter (Signed)
-----   Message from Leone Brand, NP sent at 05/31/2021 10:09 AM EDT ----- Pt needs sleep study home sleep if possible in next 2 weeks for sleep apnea.   And follow up with Dr. Mayford Knife.  I did not put in order because I have no idea what to put, but will sign order you place.  This is per Dr. Tenny Craw.

## 2021-06-09 ENCOUNTER — Other Ambulatory Visit: Payer: Self-pay

## 2021-06-09 ENCOUNTER — Telehealth: Payer: Self-pay | Admitting: Pharmacist

## 2021-06-09 NOTE — Telephone Encounter (Signed)
Pharmacy Transitions of Care Follow-up Telephone Call  Date of discharge: 05/31/2021  Discharge Diagnosis: A. Flutter  How have you been since you were released from the hospital? Richard Vazquez states he has been good since his discharge home. He has had some dark colored urine but spoke with a nurse at cardiology and was advised to drink more water.  Richard Vazquez states this has improved with increase fluids. He is not having an side effects from his medications.  Medication changes made at discharge:  - START: Eliquis 5mg , Cartia XT 180mg   - STOPPED: n/a  - CHANGED: n/a  Medication changes verified by the patient? Yes    Medication Accessibility:  Home Pharmacy: Richard Vazquez does not currently have a home pharmacy.  He states his wife uses and he may start using them as well or mail order provided by [facility]. I recommended the Out Patient Pharmacy at [facility].    Was the patient provided with refills on discharged medications? No   Have all prescriptions been transferred from [facility] to home pharmacy? N/a   Is the patient able to afford medications? yes Notable copays: no Eligible patient assistance: n/a    Medication Review: APIXABAN (ELIQUIS)  Apixaban 5 mg BID.  - Discussed importance of taking medication around the same time everyday  - Reviewed potential DDIs with patient  - Advised patient of medications to avoid (NSAIDs, ASA)  - Educated that Tylenol (acetaminophen) will be the preferred analgesic to prevent risk of bleeding  - Emphasized importance of monitoring for signs and symptoms of bleeding (abnormal bruising, prolonged bleeding, nose bleeds, bleeding from gums, discolored urine, black tarry stools)  - Advised patient to alert all providers of anticoagulation therapy prior to starting a new medication or having a procedure  Follow-up Appointments:  Specialist Hospital f/u appt confirmed? yes Scheduled to see Dr. [facility] on 10/26.  If  their condition worsens, is the pt aware to call PCP or go to the Emergency Dept.? yes  Final Patient Assessment: Richard Vazquez is doing well since his discharge home. He is aware of his follow up appointment.  He is looking into finding a pharmacy.

## 2021-06-10 NOTE — Telephone Encounter (Signed)
Prior Authorization for HST sent to [facility] via web portal.  do not require Pre-Authorization by [employer].

## 2021-06-20 NOTE — Progress Notes (Signed)
Cardiology Office Note    Date:  06/22/2021   ID:  Richard Vazquez, DOB 02/28/1956, MRN [id number]   PCP:  [facility]  Cardiologist:  Richard Pates, MD   Advanced Practice Provider:  No care team member to display Electrophysiologist:  None   [id number]   Chief Complaint  Patient presents with   Hospitalization Follow-up    History of Present Illness:  Richard Vazquez is a 65 y.o. male with history of obesity, question of sleep apnea, hyperlipidemia.  Patient presented to the hospital with new atrial flutter and underwent TEE guided cardioversion 05/31/2021.  Plan for Eliquis for at least 1 month.  Would recommend checking 0 monitor for 1 month to see if there is recurrence since he was asymptomatic.  CHA2DS2-VASc equals 1.  LVEF 75% on echo.  Sleep study also ordered. TSH 6.227, T4 1.17  Patient comes in for f/u. Back to work and doing well. No recurrent palpitations. Did have some DOE and palpitations a month prior to hospitalization. Waiting insurance approval for sleep study. No regular exercise. Works 10 hr shifts and walks a lot at work. Has some anxiety and stress at work.  Past Medical History:  Diagnosis Date   Hyperlipemia    Kidney stones    Obesity     Past Surgical History:  Procedure Laterality Date   APPENDECTOMY     CARDIOVERSION N/A 05/31/2021   Procedure: CARDIOVERSION;  Surgeon: Chrystie Nose, MD;  Location: [facility] ENDOSCOPY;  Service: Cardiovascular;  Laterality: N/A;   TEE WITHOUT CARDIOVERSION N/A 05/31/2021   Procedure: TRANSESOPHAGEAL ECHOCARDIOGRAM (TEE);  Surgeon: Chrystie Nose, MD;  Location: [facility] ENDOSCOPY;  Service: Cardiovascular;  Laterality: N/A;    Current Medications: Current Meds  Medication Sig   apixaban (ELIQUIS) 5 MG TABS tablet Take 1 tablet (5 mg total) by mouth 2 (two) times daily.   aspirin-acetaminophen-caffeine (EXCEDRIN MIGRAINE) 250-250-65 MG tablet Take 2 tablets by mouth every 6  (six) hours as needed for headache.   bisacodyl (DULCOLAX) 5 MG EC tablet Take 5 mg by mouth as needed for moderate constipation.   diltiazem (CARDIZEM CD) 180 MG 24 hr capsule Take 1 capsule (180 mg total) by mouth daily.   ibuprofen (ADVIL) 200 MG tablet Take 200 mg by mouth every 6 (six) hours as needed for headache or mild pain.   Iron, Ferrous Sulfate, 325 (65 Fe) MG TABS Take 1 tablet by mouth daily.   Omega-3 1000 MG CAPS Take 2 capsules by mouth daily.   OVER THE COUNTER MEDICATION Take 2 capsules by mouth daily. Healthy Beet supplement   polycarbophil (FIBERCON) 625 MG tablet Take 1,250 mg by mouth daily.   vitamin B-12 (CYANOCOBALAMIN) 500 MCG tablet Take 500 mcg by mouth daily.     Allergies:   Atorvastatin, Other, Codeine, and Latex   Social History   Socioeconomic History   Marital status: Married    Spouse name: Not on file   Number of children: Not on file   Years of education: Not on file   Highest education level: Not on file  Occupational History   Not on file  Tobacco Use   Smoking status: Former    Packs/day: 1.50    Years: 40.00    Pack years: 60.00    Types: Cigarettes    Quit date: 12/08/2020    Years since quitting: 0.5    Passive exposure: Past   Smokeless tobacco: Never  Vaping Use  Vaping Use: Never used  Substance and Sexual Activity   Alcohol use: Not on file   Drug use: Not on file   Sexual activity: Not on file  Other Topics Concern   Not on file  Social History Narrative   Not on file   Social Determinants of Health   Financial Resource Strain: Not on file  Food Insecurity: Not on file  Transportation Needs: Not on file  Physical Activity: Not on file  Stress: Not on file  Social Connections: Not on file     Family History:  The patient's  family history is not on file.   ROS:   Please see the history of present illness.    ROS All other systems reviewed and are negative.   PHYSICAL EXAM:   VS:  BP 130/82   Pulse 67   Ht  6\' 3"  (1.905 m)   Wt 272 lb (123.4 kg)   SpO2 95%   BMI 34.00 kg/m   Physical Exam  GEN: Obese, in no acute distress  Neck: no JVD, carotid bruits, or masses Cardiac:RRR; no murmurs, rubs, or gallops  Respiratory:  clear to auscultation bilaterally, normal work of breathing GI: soft, nontender, nondistended, + BS Ext: trace edema, varicose veins, , Good distal pulses bilaterally Neuro:  Alert and Oriented x 3 Psych: euthymic mood, full affect  Wt Readings from Last 3 Encounters:  06/22/21 272 lb (123.4 kg)  05/31/21 276 lb 15.3 oz (125.6 kg)      Studies/Labs Reviewed:   EKG:  EKG is not ordered today.     Recent Labs: 05/29/2021: ALT 18; B Natriuretic Peptide 285.6; TSH 6.227 05/31/2021: BUN 17; Creatinine, Ser 1.10; Hemoglobin 15.4; Magnesium 2.0; Platelets 239; Potassium 4.0; Sodium 136   Lipid Panel No results found for: CHOL, TRIG, HDL, CHOLHDL, VLDL, LDLCALC, LDLDIRECT  Additional studies/ records that were reviewed today include:   Echo 05/30/21 IMPRESSIONS     1. Left ventricular ejection fraction, by estimation, is >75%. The left  ventricle has hyperdynamic function. The left ventricle has no regional  wall motion abnormalities. There is mild left ventricular hypertrophy.  Left ventricular diastolic parameters  are indeterminate.   2. Right ventricular systolic function is normal. The right ventricular  size is normal.   3. The mitral valve is normal in structure. No evidence of mitral valve  regurgitation. No evidence of mitral stenosis.   4. The aortic valve is tricuspid. Aortic valve regurgitation is not  visualized. Mild aortic valve sclerosis is present, with no evidence of  aortic valve stenosis.   5. The inferior vena cava is normal in size with greater than 50%  respiratory variability, suggesting right atrial pressure of 3 mmHg.    Risk Assessment/Calculations:    CHA2DS2-VASc Score = 1   This indicates a 0.6% annual risk of stroke. The  patient's score is based upon: CHF History: 0 HTN History: 0 Diabetes History: 0 Stroke History: 0 Vascular Disease History: 0 Age Score: 1 Gender Score: 0        ASSESSMENT:    1. Atrial flutter with rapid ventricular response (HCC)   2. Suspected sleep apnea   3. Other hyperlipidemia   4. Morbid obesity (HCC)      PLAN:  In order of problems listed above:  Atrial flutter status post TEE guided cardioversion 05/30/2021.  Plan for Eliquis for at least 1 month.  CHA2DS2-VASc equals 1.  Not many symptoms so recommended Zio patch. Patient says he did have  some DOE and palpitations off/on for a month prior. TSH/T4 abnormal in hospital-needs f/u with PCP  Obesity exercise and weight loss discussed  Suspected sleep apnea sleep study scheduled  Hyperlipidemia-previously on statin but now on fish oil. For FLP with PCP  Shared Decision Making/Informed Consent        Medication Adjustments/Labs and Tests Ordered: Current medicines are reviewed at length with the patient today.  Concerns regarding medicines are outlined above.  Medication changes, Labs and Tests ordered today are listed in the Patient Instructions below. Patient Instructions  Medication Instructions:  Your physician recommends that you continue on your current medications as directed. Please refer to the Current Medication list given to you today.  *If you need a refill on your cardiac medications before your next appointment, please call your pharmacy*   Lab Work: None ordered   If you have labs (blood work) drawn today and your tests are completely normal, you will receive your results only by: MyChart Message (if you have MyChart) OR A paper copy in the mail If you have any lab test that is abnormal or we need to change your treatment, we will call you to review the results.   Testing/Procedures: Your physician has recommended that you wear an event monitor. Event monitors are medical devices that  record the heart's electrical activity. Doctors most often [place] these monitors to diagnose arrhythmias. Arrhythmias are problems with the speed or rhythm of the heartbeat. The monitor is a small, portable device. You can wear one while you do your normal daily activities. This is usually used to diagnose what is causing palpitations/syncope (passing out).    Follow-Up: At [facility], you and your health needs are our priority.  As part of our continuing mission to provide you with exceptional heart care, we have created designated Provider Care Teams.  These Care Teams include your primary Cardiologist (physician) and Advanced Practice Providers (APPs -  Physician Assistants and Nurse Practitioners) who all work together to provide you with the care you need, when you need it.  We recommend signing up for the patient portal called "MyChart".  Sign up information is provided on this After Visit Summary.  MyChart is used to connect with patients for Virtual Visits (Telemedicine).  Patients are able to view lab/test results, encounter notes, upcoming appointments, etc.  Non-urgent messages can be sent to your provider as well.   To learn more about what you can do with MyChart, go to [email or web address].    Your next appointment:   6 week(s)  The format for your next appointment:   In Person  Provider:   You may see Richard Pates, MD or one of the following Advanced Practice Providers on your designated Care Team:   Tereso Newcomer, PA-C Chelsea Aus, [place]   Other Instructions Follow up with your primary care doctor about your Thyroids    Signed, Jacolyn Reedy, Cordelia Poche  06/22/2021 1:12 PM    [facility] [address] [place], [place], [place]  [address] Phone: [phone]; Fax: [phone]

## 2021-06-22 ENCOUNTER — Encounter: Payer: Self-pay | Admitting: *Deleted

## 2021-06-22 ENCOUNTER — Ambulatory Visit: Payer: Medicare Other | Admitting: Physician Assistant

## 2021-06-22 ENCOUNTER — Other Ambulatory Visit: Payer: Self-pay

## 2021-06-22 ENCOUNTER — Encounter: Payer: Self-pay | Admitting: Physician Assistant

## 2021-06-22 VITALS — BP 130/82 | HR 67 | Ht 75.0 in | Wt 272.0 lb

## 2021-06-22 DIAGNOSIS — R29818 Other symptoms and signs involving the nervous system: Secondary | ICD-10-CM

## 2021-06-22 DIAGNOSIS — E7849 Other hyperlipidemia: Secondary | ICD-10-CM | POA: Diagnosis not present

## 2021-06-22 DIAGNOSIS — I4892 Unspecified atrial flutter: Secondary | ICD-10-CM | POA: Diagnosis not present

## 2021-06-22 NOTE — Progress Notes (Signed)
Patient ID: Richard Vazquez, male   DOB: [birth year], 65 y.o.   MRN: [id number] Patient enrolled for [employer] to ship a 30 day cardiac event monitor to his home. Letter with instructions mailed to patient.

## 2021-06-22 NOTE — Patient Instructions (Signed)
Medication Instructions:  Your physician recommends that you continue on your current medications as directed. Please refer to the Current Medication list given to you today.  *If you need a refill on your cardiac medications before your next appointment, please call your pharmacy*   Lab Work: None ordered   If you have labs (blood work) drawn today and your tests are completely normal, you will receive your results only by: MyChart Message (if you have MyChart) OR A paper copy in the mail If you have any lab test that is abnormal or we need to change your treatment, we will call you to review the results.   Testing/Procedures: Your physician has recommended that you wear an event monitor. Event monitors are medical devices that record the heart's electrical activity. Doctors most often [place] these monitors to diagnose arrhythmias. Arrhythmias are problems with the speed or rhythm of the heartbeat. The monitor is a small, portable device. You can wear one while you do your normal daily activities. This is usually used to diagnose what is causing palpitations/syncope (passing out).    Follow-Up: At [facility], you and your health needs are our priority.  As part of our continuing mission to provide you with exceptional heart care, we have created designated Provider Care Teams.  These Care Teams include your primary Cardiologist (physician) and Advanced Practice Providers (APPs -  Physician Assistants and Nurse Practitioners) who all work together to provide you with the care you need, when you need it.  We recommend signing up for the patient portal called "MyChart".  Sign up information is provided on this After Visit Summary.  MyChart is used to connect with patients for Virtual Visits (Telemedicine).  Patients are able to view lab/test results, encounter notes, upcoming appointments, etc.  Non-urgent messages can be sent to your provider as well.   To learn more about what you can do with  MyChart, go to [email or web address].    Your next appointment:   6 week(s)  The format for your next appointment:   In Person  Provider:   You may see Dietrich Pates, MD or one of the following Advanced Practice Providers on your designated Care Team:   Tereso Newcomer, PA-C Chelsea Aus, [place]   Other Instructions Follow up with your primary care doctor about your Thyroids

## 2021-06-29 ENCOUNTER — Other Ambulatory Visit: Payer: Self-pay

## 2021-06-29 MED ORDER — APIXABAN 5 MG PO TABS: 5.0000 mg | ORAL_TABLET | Freq: Two times a day (BID) | ORAL | 3 refills | Status: DC

## 2021-06-29 MED ORDER — DILTIAZEM HCL ER COATED BEADS 180 MG PO CP24
180.0000 mg | ORAL_CAPSULE | Freq: Every day | ORAL | 3 refills | Status: DC
Start: 2021-06-29 — End: 2022-06-21

## 2021-07-08 DIAGNOSIS — I4892 Unspecified atrial flutter: Secondary | ICD-10-CM

## 2021-07-10 ENCOUNTER — Ambulatory Visit (INDEPENDENT_AMBULATORY_CARE_PROVIDER_SITE_OTHER): Payer: Medicare Other

## 2021-07-10 DIAGNOSIS — I4892 Unspecified atrial flutter: Secondary | ICD-10-CM

## 2021-07-11 NOTE — Telephone Encounter (Signed)
Informed patient of upcoming home sleep study  LMTCB. Patient  HST is scheduled for 07/18/21 at 10:30. Pt is aware of testing date.

## 2021-07-18 ENCOUNTER — Ambulatory Visit (HOSPITAL_BASED_OUTPATIENT_CLINIC_OR_DEPARTMENT_OTHER): Payer: Medicare Other | Admitting: Cardiology

## 2021-07-18 ENCOUNTER — Other Ambulatory Visit: Payer: Self-pay

## 2021-07-25 NOTE — Progress Notes (Signed)
This encounter was created in error - please disregard.

## 2021-07-25 NOTE — Procedures (Signed)
Erroneous encounter

## 2021-08-03 ENCOUNTER — Ambulatory Visit: Payer: Medicare Other | Admitting: Physician Assistant

## 2021-08-04 NOTE — Progress Notes (Signed)
Cardiology Office Note:    Date:  08/05/2021   ID:  Richard Vazquez, DOB 1956/08/16, MRN [id number]  PCP:  Crista Elliot, [facility] Providers Cardiologist:  Dorris Carnes, MD Cardiology APP:  Sharmon Revere    Referring MD: Crista Elliot, PA-C   Chief Complaint:  F/u for Aflutter    Patient Profile:   Richard Vazquez is a 65 y.o. male with:  Atrial flutter Admx 10/22 w/ RVR >> s/p TEE - DCCV CHA2DS2-VASc Score = 2 [CHF History: 0, HTN History: 0, Diabetes History: 0, Stroke History: 0, Vascular Disease History: 1, Age Score: 1, Gender Score: 0].  Therefore, the patient's annual risk of stroke is 2.2 %.    Obesity  ?OSA Hyperlipidemia  Abnormal TSH (05/2021) >> pt needs f/u TFTs Aortic atherosclerosis (CT in 2017) Nephrolithiasis  Borderline diabetes  Psoriasis  Venous insufficiency  History of Present Illness: Richard Vazquez was admitted in 05/2021 for AFlutterw RVR.   He underwent TEE guided DCCV.  His stroke risk is low with CHADS2-VASc=1.  Plan was to continue Apixaban for at leaste 1 month.  Sleep study was ordered.  He was seen in f/u by Ermalinda Barrios, PA-C 06/22/21.  A f/u Zio patch monitor was ordered.   Results are pending.    He is here alone.  Since last seen, he has not had any recurrent shortness of breath or palpitations.  He has not had chest pain.  He sometimes hears his heartbeat in his left ear.  He wonders if he has carotid stenosis.  His father had strokes.  He has not had syncope, orthopnea.  He has venous insufficiency with chronic leg edema.  The left leg is always worse than the right.  ASSESSMENT & PLAN:   Paroxysmal atrial flutter (Echo) He was admitted in October with atrial flutter with rapid ventricular rate.  He underwent transesophageal echocardiogram guided cardioversion.  He remains on apixaban.  I reviewed his chart and he does have a CT scan from 2017 that demonstrated aortic atherosclerosis.  He is age 36.  He has borderline diabetes,  according to his report.  Overall, I think his stroke risk is elevated.  I have recommended continuing with anticoagulation therapy.  He is comfortable with this.  Of note, he does have a history of nephrolithiasis.  He has seen darker urine at times recently.  He has an appointment pending with urology.  I advised him to stop apixaban if he should see bright red blood.  Obtain follow-up CMET, CBC today.  Final report on his event monitor is pending.  He knows to avoid NSAIDs and aspirin.  Follow-up in 3 months.  Hyperlipemia He does not take a statin.  As noted, he did have aortic atherosclerosis on a prior CT scan.  Obtain fasting lipids, CMET today.  Consider statin therapy.  Abnormal TSH TSH was elevated in the hospital.  Obtain follow-up TSH, free T4, free T3.  If TFTs remain abnormal, follow-up with primary care.  Left carotid bruit He is concerned about carotid stenosis.  He has a questionable bruit on the left.  Obtain carotid Dopplers.  Hematuria As noted, he has a history of nephrolithiasis.  He has had some darker urine but not any bright red blood.  He he has an appointment pending with urology.  As noted, I asked him to stop his apixaban if he should see bright red blood.  Obtain CBC today.  Dispo:  Return in about 3 months (around 11/03/2021) for Routine Follow Up, w/ Dr. Harrington Challenger.    Prior CV studies: TEE 05/31/21 EF 65-70, no RWMA, mild LVH, normal RVSF, trivial MR  Echocardiogram 05/30/21 EF > 75, no RWMA, mild LVH, normal RVSF, AV sclerosis w/o AS    Past Medical History:  Diagnosis Date   Hyperlipemia    Kidney stones    Obesity    Paroxysmal atrial flutter ([place]) 05/29/2021   Current Medications: Current Meds  Medication Sig   apixaban (ELIQUIS) 5 MG TABS tablet Take 1 tablet (5 mg total) by mouth 2 (two) times daily.   aspirin-acetaminophen-caffeine (EXCEDRIN MIGRAINE) 250-250-65 MG tablet Take 2 tablets by mouth every 6 (six) hours as needed for headache.    bisacodyl (DULCOLAX) 5 MG EC tablet Take 5 mg by mouth as needed for moderate constipation.   diltiazem (CARDIZEM CD) 180 MG 24 hr capsule Take 1 capsule (180 mg total) by mouth daily.   Iron, Ferrous Sulfate, 325 (65 Fe) MG TABS Take 1 tablet by mouth daily.   Omega-3 1000 MG CAPS Take 2 capsules by mouth daily.   OVER THE COUNTER MEDICATION Take 2 capsules by mouth daily. Healthy Beet supplement   polycarbophil (FIBERCON) 625 MG tablet Take 1,250 mg by mouth daily.   vitamin B-12 (CYANOCOBALAMIN) 500 MCG tablet Take 500 mcg by mouth daily.    Allergies:   Atorvastatin, Other, Codeine, and Latex   Social History   Tobacco Use   Smoking status: Former    Packs/day: 1.50    Years: 40.00    Pack years: 60.00    Types: Cigarettes    Quit date: 12/08/2020    Years since quitting: 0.6    Passive exposure: Past   Smokeless tobacco: Never  Vaping Use   Vaping Use: Never used    Family Hx: The patient's family history is not on file.  Review of Systems  Genitourinary:  Positive for hematuria (??dark urine at times - he is getting a f/u appt with urology).    EKGs/Labs/Other Test Reviewed:    EKG:  EKG is not ordered today.  The ekg ordered today demonstrates n/a  Recent Labs: 05/29/2021: ALT 18; B Natriuretic Peptide 285.6; TSH 6.227 05/31/2021: BUN 17; Creatinine, Ser 1.10; Hemoglobin 15.4; Magnesium 2.0; Platelets 239; Potassium 4.0; Sodium 136   Recent Lipid Panel No results found for: CHOL, TRIG, HDL, LDLCALC, LDLDIRECT   Risk Assessment/Calculations:    CHA2DS2-VASc Score = 2   This indicates a 2.2% annual risk of stroke. The patient's score is based upon: CHF History: 0 HTN History: 0 Diabetes History: 0 Stroke History: 0 Vascular Disease History: 1 Age Score: 1 Gender Score: 0         Physical Exam:    VS:  BP 138/60   Pulse 70   Ht _0  (1.905 m)   Wt 270 lb 6.4 oz (122.7 kg)   SpO2 97%   BMI 33.80 kg/m     Wt Readings from Last 3 Encounters:   08/05/21 270 lb 6.4 oz (122.7 kg)  06/22/21 272 lb (123.4 kg)  05/31/21 276 lb 15.3 oz (125.6 kg)    Constitutional:      Appearance: Healthy appearance. Not in distress.  Neck:     Vascular: No JVR. JVD normal.  Pulmonary:     Effort: Pulmonary effort is normal.     Breath sounds: No wheezing. No rales.  Cardiovascular:     Normal rate. Regular  rhythm. Normal S1. Normal S2.      Murmurs: There is no murmur.     Comments: ?L carotid bruit Edema:    Peripheral edema present.    Pretibial: 1+ edema of the left pretibial area and trace edema of the right pretibial area. Abdominal:     Palpations: Abdomen is soft.  Skin:    General: Skin is warm and dry.  Neurological:     General: No focal deficit present.     Mental Status: Alert and oriented to person, place and time.     Cranial Nerves: Cranial nerves are intact.       Medication Adjustments/Labs and Tests Ordered: Current medicines are reviewed at length with the patient today.  Concerns regarding medicines are outlined above.  Tests Ordered: Orders Placed This Encounter  Procedures   TSH   T4, free   T3, free   Comp Met (CMET)   CBC   Lipid Profile   VAS US CAROTID    Medication Changes: No orders of the defined types were placed in this encounter.  Signed, Richardson Dopp, PA-C  08/05/2021 9:40 AM    [facility] [place], [place], [place]  [address] Phone: [phone]; Fax: [phone]

## 2021-08-05 ENCOUNTER — Other Ambulatory Visit: Payer: Self-pay

## 2021-08-05 ENCOUNTER — Ambulatory Visit: Payer: Medicare Other | Admitting: Physician Assistant

## 2021-08-05 ENCOUNTER — Encounter: Payer: Self-pay | Admitting: Physician Assistant

## 2021-08-05 VITALS — BP 138/60 | HR 70 | Ht 75.0 in | Wt 270.4 lb

## 2021-08-05 DIAGNOSIS — I4892 Unspecified atrial flutter: Secondary | ICD-10-CM

## 2021-08-05 DIAGNOSIS — E7800 Pure hypercholesterolemia, unspecified: Secondary | ICD-10-CM | POA: Diagnosis not present

## 2021-08-05 DIAGNOSIS — I700 Atherosclerosis of aorta: Secondary | ICD-10-CM | POA: Diagnosis not present

## 2021-08-05 DIAGNOSIS — R0989 Other specified symptoms and signs involving the circulatory and respiratory systems: Secondary | ICD-10-CM | POA: Insufficient documentation

## 2021-08-05 DIAGNOSIS — R7989 Other specified abnormal findings of blood chemistry: Secondary | ICD-10-CM | POA: Diagnosis not present

## 2021-08-05 DIAGNOSIS — R319 Hematuria, unspecified: Secondary | ICD-10-CM

## 2021-08-05 HISTORY — DX: Other specified symptoms and signs involving the circulatory and respiratory systems: R09.89

## 2021-08-05 LAB — COMPREHENSIVE METABOLIC PANEL
ALT: 15 IU/L (ref 0–44)
AST: 18 IU/L (ref 0–40)
Albumin/Globulin Ratio: 2 (ref 1.2–2.2)
Albumin: 4.3 g/dL (ref 3.8–4.8)
Alkaline Phosphatase: 85 IU/L (ref 44–121)
BUN/Creatinine Ratio: 22 (ref 10–24)
BUN: 22 mg/dL (ref 8–27)
Bilirubin Total: 0.4 mg/dL (ref 0.0–1.2)
CO2: 25 mmol/L (ref 20–29)
Calcium: 9.2 mg/dL (ref 8.6–10.2)
Chloride: 103 mmol/L (ref 96–106)
Creatinine, Ser: 0.98 mg/dL (ref 0.76–1.27)
Globulin, Total: 2.1 g/dL (ref 1.5–4.5)
Glucose: 96 mg/dL (ref 70–99)
Potassium: 4.4 mmol/L (ref 3.5–5.2)
Sodium: 141 mmol/L (ref 134–144)
Total Protein: 6.4 g/dL (ref 6.0–8.5)
eGFR: 86 mL/min/{1.73_m2} (ref >59)

## 2021-08-05 LAB — CBC
Hematocrit: 42.5 % (ref 37.5–51.0)
Hemoglobin: 14.3 g/dL (ref 13.0–17.7)
MCH: 30.3 pg (ref 26.6–33.0)
MCHC: 33.6 g/dL (ref 31.5–35.7)
MCV: 90 fL (ref 79–97)
Platelets: 260 10*3/uL (ref 150–450)
RBC: 4.72 x10E6/uL (ref 4.14–5.80)
RDW: 13.3 % (ref 11.6–15.4)
WBC: 9 10*3/uL (ref 3.4–10.8)

## 2021-08-05 LAB — T4, FREE: Free T4: 1.19 ng/dL (ref 0.82–1.77)

## 2021-08-05 LAB — TSH: TSH: 3.57 u[IU]/mL (ref 0.450–4.500)

## 2021-08-05 LAB — LIPID PANEL
Chol/HDL Ratio: 5.1 ratio — ABNORMAL HIGH (ref 0.0–5.0)
Cholesterol, Total: 183 mg/dL (ref 100–199)
HDL: 36 mg/dL — ABNORMAL LOW (ref >39)
LDL Chol Calc (NIH): 126 mg/dL — ABNORMAL HIGH (ref 0–99)
Triglycerides: 115 mg/dL (ref 0–149)
VLDL Cholesterol Cal: 21 mg/dL (ref 5–40)

## 2021-08-05 LAB — T3, FREE: T3, Free: 3.6 pg/mL (ref 2.0–4.4)

## 2021-08-05 NOTE — Assessment & Plan Note (Signed)
He is concerned about carotid stenosis.  He has a questionable bruit on the left.  Obtain carotid Dopplers.

## 2021-08-05 NOTE — Assessment & Plan Note (Signed)
He was admitted in October with atrial flutter with rapid ventricular rate.  He underwent transesophageal echocardiogram guided cardioversion.  He remains on apixaban.  I reviewed his chart and he does have a CT scan from 2017 that demonstrated aortic atherosclerosis.  He is age 65.  He has borderline diabetes, according to his report.  Overall, I think his stroke risk is elevated.  I have recommended continuing with anticoagulation therapy.  He is comfortable with this.  Of note, he does have a history of nephrolithiasis.  He has seen darker urine at times recently.  He has an appointment pending with urology.  I advised him to stop apixaban if he should see bright red blood.  Obtain follow-up CMET, CBC today.  Final report on his event monitor is pending.  He knows to avoid NSAIDs and aspirin.  Follow-up in 3 months.

## 2021-08-05 NOTE — Assessment & Plan Note (Signed)
TSH was elevated in the hospital.  Obtain follow-up TSH, free T4, free T3.  If TFTs remain abnormal, follow-up with primary care.

## 2021-08-05 NOTE — Assessment & Plan Note (Signed)
As noted, he has a history of nephrolithiasis.  He has had some darker urine but not any bright red blood.  He he has an appointment pending with urology.  As noted, I asked him to stop his apixaban if he should see bright red blood.  Obtain CBC today.

## 2021-08-05 NOTE — Patient Instructions (Signed)
Medication Instructions:   Your physician recommends that you continue on your current medications as directed. Please refer to the Current Medication list given to you today.  *If you need a refill on your cardiac medications before your next appointment, please call your pharmacy*   Lab Work:  TODAY!!!!!! TSH/FREE T4/T3/CMET/LIPID/CBC  If you have labs (blood work) drawn today and your tests are completely normal, you will receive your results only by: MyChart Message (if you have MyChart) OR A paper copy in the mail If you have any lab test that is abnormal or we need to change your treatment, we will call you to review the results.   Testing/Procedures:  Your physician has requested that you have a carotid duplex. This test is an ultrasound of the carotid arteries in your neck. It looks at blood flow through these arteries that supply the brain with blood. Allow one hour for this exam. There are no restrictions or special instructions.    Follow-Up: At [facility], you and your health needs are our priority.  As part of our continuing mission to provide you with exceptional heart care, we have created designated Provider Care Teams.  These Care Teams include your primary Cardiologist (physician) and Advanced Practice Providers (APPs -  Physician Assistants and Nurse Practitioners) who all work together to provide you with the care you need, when you need it.  We recommend signing up for the patient portal called "MyChart".  Sign up information is provided on this After Visit Summary.  MyChart is used to connect with patients for Virtual Visits (Telemedicine).  Patients are able to view lab/test results, encounter notes, upcoming appointments, etc.  Non-urgent messages can be sent to your provider as well.   To learn more about what you can do with MyChart, go to [email or web address].    Your next appointment:   3 month(s)  The format for your next appointment:   In  Person  Provider:   Dietrich Pates, MD     Other Instructions

## 2021-08-05 NOTE — Assessment & Plan Note (Signed)
He does not take a statin.  As noted, he did have aortic atherosclerosis on a prior CT scan.  Obtain fasting lipids, CMET today.  Consider statin therapy.

## 2021-08-08 ENCOUNTER — Other Ambulatory Visit: Payer: Self-pay | Admitting: *Deleted

## 2021-08-08 MED ORDER — ROSUVASTATIN CALCIUM 20 MG PO TABS: 20.0000 mg | ORAL_TABLET | Freq: Every day | ORAL | 3 refills | Status: DC

## 2021-08-09 ENCOUNTER — Telehealth: Payer: Self-pay | Admitting: Internal Medicine

## 2021-08-09 NOTE — Telephone Encounter (Signed)
Hold Rosuvastatin for 2 weeks. Try it again after 2 weeks.  If symptoms recur, call so that we can change to a different medication.  Agree with taking Diltiazem and Apixaban.  If palpitations continue (remain constant) or worsen, go to the ED.  Tereso Newcomer, PA-C    08/09/2021 5:16 PM

## 2021-08-09 NOTE — Telephone Encounter (Signed)
Spoke with the patient who states that he woke up today around 1:45pm, as he works night shift, and his heart was pounding. He states that he also is feeling a pulse/pounding in his head. He states that his heart rate is 89. She has had some slight SOB. The only thing he has changed is that he started taking rosuvastatin. His first dose was yesterday at 1:30pm. He reports that he finished wearing his heart monitor on Saturday. He did not have any symptoms while wearing the monitor. HE reports that he has not taken his cardizem or Eliquis today. I advised him to take both medications and continue to monitor.

## 2021-08-09 NOTE — Telephone Encounter (Signed)
Pt c/o medication issue:  1. Name of Medication: rosuvastatin   2. How are you currently taking this medication (dosage and times per day)?    3. Are you having a reaction (difficulty breathing--STAT)? no  4. What is your medication issue? Patient took one pill last night and woke up with his heart pounding. Causing him to shake. Please advise

## 2021-08-10 NOTE — Telephone Encounter (Signed)
Pt advised of Scott Weavers recommendation and pt is agreeable to plan.

## 2021-08-10 NOTE — Telephone Encounter (Signed)
Left message for patient to call back  

## 2021-08-19 ENCOUNTER — Encounter: Payer: Self-pay | Admitting: Internal Medicine

## 2021-08-26 ENCOUNTER — Ambulatory Visit (HOSPITAL_COMMUNITY)
Admission: RE | Admit: 2021-08-26 | Discharge: 2021-08-26 | Disposition: A | Discharge status: Home / Self Care | Payer: Medicare Other | Source: Ambulatory Visit | Attending: Internal Medicine | Admitting: Internal Medicine

## 2021-08-26 ENCOUNTER — Other Ambulatory Visit: Payer: Self-pay

## 2021-08-26 DIAGNOSIS — R7989 Other specified abnormal findings of blood chemistry: Secondary | ICD-10-CM | POA: Diagnosis not present

## 2021-08-26 DIAGNOSIS — I4892 Unspecified atrial flutter: Secondary | ICD-10-CM | POA: Diagnosis not present

## 2021-08-26 DIAGNOSIS — I700 Atherosclerosis of aorta: Secondary | ICD-10-CM | POA: Insufficient documentation

## 2021-08-26 DIAGNOSIS — E7800 Pure hypercholesterolemia, unspecified: Secondary | ICD-10-CM | POA: Insufficient documentation

## 2021-08-29 ENCOUNTER — Encounter: Payer: Self-pay | Admitting: Physician Assistant

## 2021-08-29 DIAGNOSIS — I779 Disorder of arteries and arterioles, unspecified: Secondary | ICD-10-CM | POA: Insufficient documentation

## 2021-08-31 ENCOUNTER — Other Ambulatory Visit: Payer: Self-pay | Admitting: *Deleted

## 2021-08-31 DIAGNOSIS — R0989 Other specified symptoms and signs involving the circulatory and respiratory systems: Secondary | ICD-10-CM

## 2021-09-02 ENCOUNTER — Ambulatory Visit (HOSPITAL_BASED_OUTPATIENT_CLINIC_OR_DEPARTMENT_OTHER): Payer: Medicare Other | Admitting: Cardiology

## 2021-09-06 NOTE — Progress Notes (Signed)
This encounter was created in error - please disregard.

## 2021-09-06 NOTE — Procedures (Signed)
Erroneous encounter

## 2021-09-09 ENCOUNTER — Ambulatory Visit (HOSPITAL_BASED_OUTPATIENT_CLINIC_OR_DEPARTMENT_OTHER): Payer: Medicare Other | Attending: Internal Medicine | Admitting: Cardiology

## 2021-09-09 DIAGNOSIS — I4892 Unspecified atrial flutter: Secondary | ICD-10-CM

## 2021-09-09 DIAGNOSIS — G4736 Sleep related hypoventilation in conditions classified elsewhere: Secondary | ICD-10-CM | POA: Insufficient documentation

## 2021-09-09 DIAGNOSIS — G4733 Obstructive sleep apnea (adult) (pediatric): Secondary | ICD-10-CM

## 2021-09-09 DIAGNOSIS — G4734 Idiopathic sleep related nonobstructive alveolar hypoventilation: Secondary | ICD-10-CM

## 2021-09-12 NOTE — Procedures (Signed)
° °  Patient Name: Richard Vazquez, Richard Vazquez Date: 09/11/2021 Gender: Male D.O.B: 07-01-1956 Age (years): 65 Referring Provider: Dietrich Pates Height (inches): 75 Interpreting Physician: Armanda Magic MD, ABSM Weight (lbs): 270 RPSGT: [place] BMI: 34 MRN: [id number] Neck Size: 18.00  CLINICAL INFORMATION Sleep Study Type: HST  Indication for sleep study: N/A  Epworth Sleepiness Score: 7  SLEEP STUDY TECHNIQUE A multi-channel overnight portable sleep study was performed. The channels recorded were: nasal airflow, thoracic respiratory movement, and oxygen saturation with a pulse oximetry. Snoring was also monitored.  MEDICATIONS Patient self administered medications include: N/A.  SLEEP ARCHITECTURE Patient was studied for 364 minutes. The sleep efficiency was 100.0 % and the patient was supine for 1%. The arousal index was 0.0 per hour.  RESPIRATORY PARAMETERS The overall AHI was 62.6 per hour, with a central apnea index of 0 per hour.  The oxygen nadir was 77% during sleep.  CARDIAC DATA Mean heart rate during sleep was 77.4 bpm.  IMPRESSIONS - Severe obstructive sleep apnea occurred during this study (AHI = 62.6/h). - Severe oxygen desaturation was noted during this study (Min O2 = 77%). - Patient snored 44.4% during the sleep.  DIAGNOSIS - Obstructive Sleep Apnea (G47.33) - Nocturnal Hypoxemia (G47.36)  RECOMMENDATIONS - Recommend in lab CPAP titration due to severity of sleep disordered breathing and nocturnal hypoxemia.  - Avoid alcohol, sedatives and other CNS depressants that may worsen sleep apnea and disrupt normal sleep architecture. - Sleep hygiene should be reviewed to assess factors that may improve sleep quality. - Weight management and regular exercise should be initiated or continued.  [Electronically signed] 09/12/2021 05:53 PM  Armanda Magic MD, ABSM Diplomate, American Board of Sleep Medicine

## 2021-09-17 ENCOUNTER — Telehealth: Payer: Self-pay | Admitting: Physician Assistant

## 2021-09-17 NOTE — Telephone Encounter (Signed)
66 year old male with a history of paroxysmal atrial flutter.  He has an upper respiratory tract infection.  He called to see what types of nose sprays he can use for nasal congestion.  I advised him to avoid stimulants such as Afrin.  He can use saline nose spray or Flonase. Tereso Newcomer, PA-C 09/17/2021 12:15 PM

## 2021-09-19 DIAGNOSIS — Z20822 Contact with and (suspected) exposure to covid-19: Secondary | ICD-10-CM | POA: Diagnosis not present

## 2021-09-19 DIAGNOSIS — R000 Tachycardia, unspecified: Secondary | ICD-10-CM | POA: Diagnosis not present

## 2021-09-19 DIAGNOSIS — R051 Acute cough: Secondary | ICD-10-CM | POA: Diagnosis not present

## 2021-09-19 DIAGNOSIS — J019 Acute sinusitis, unspecified: Secondary | ICD-10-CM | POA: Diagnosis not present

## 2021-09-20 ENCOUNTER — Telehealth: Payer: Self-pay | Admitting: Internal Medicine

## 2021-09-20 NOTE — Telephone Encounter (Signed)
Pt called to report that he was in his PCP office yesterday for a sinus infection... he says they checked his BP on a monitor and his BP was 119/60 and HR 135... he said he was asymptomatic... he has not been having any palpitations, dizziness, and no SOB.... he says he was surprised that his HR was that high.   They did not do an EKG.   He had been using Coricidin OTC for his cold but it does not say it has any decongestant in it which he tries to avoid. He is now off of it and on an antibiotic.   He has an appt with Dr. Tenny Craw 10/28/21.... he does not have a way to monitor his BP and HR at home but may try to get a cuff in a few days.   I will forward to Dr. Tenny Craw if any recommendations.   Last monitor 07/2021.

## 2021-09-20 NOTE — Telephone Encounter (Signed)
I called to follow up with the pt after Dr. Tenny Craw spoke with him. His wife had purchased a wrist BP cuff...   He took his BP at 1:30 pm...133/70 HR 105... he took his Diltiazem at 2 pm as he normally does as he works the night shift.. BP at 4 pm 111/82 HR 135... he says he is asymptomatic but his radial pulse feels irregular.    He has called in sick for work tonight due to his sinus infection.    Per Dr. Tenny Craw he needs to be seen for an EKG... unable to make the pt a nurse visit for tomorrow so he agreed to see the DOD Dr. Izora Ribas  at 11:30 am... he will continue to monitor and if anything changes or he develops symptoms he will consider having his wife take him to the ED to be monitored.

## 2021-09-20 NOTE — Telephone Encounter (Signed)
°  Per answering service message:  Patient went to see PCP on 09/19/21 for sinus infection, his heart rate was elevated to 135. Dr recommended he notify his cardiologist.

## 2021-09-21 ENCOUNTER — Other Ambulatory Visit: Payer: Self-pay

## 2021-09-21 ENCOUNTER — Telehealth: Payer: Self-pay | Admitting: Internal Medicine

## 2021-09-21 ENCOUNTER — Ambulatory Visit: Payer: Medicare Other | Admitting: Internal Medicine

## 2021-09-21 ENCOUNTER — Encounter: Payer: Self-pay | Admitting: Internal Medicine

## 2021-09-21 VITALS — BP 108/78 | HR 136 | Ht 74.0 in | Wt 272.0 lb

## 2021-09-21 DIAGNOSIS — I779 Disorder of arteries and arterioles, unspecified: Secondary | ICD-10-CM | POA: Diagnosis not present

## 2021-09-21 DIAGNOSIS — I484 Atypical atrial flutter: Secondary | ICD-10-CM

## 2021-09-21 DIAGNOSIS — I4892 Unspecified atrial flutter: Secondary | ICD-10-CM | POA: Diagnosis not present

## 2021-09-21 NOTE — H&P (View-Only) (Signed)
Cardiology Office Note:    Date:  09/21/2021   ID:  Richard Vazquez, DOB [birth year], MRN [id number]  PCP:  Crista Elliot, PA-C   [facility] Providers Cardiologist:  Dorris Carnes, MD Cardiology APP:  [facility]     Referring MD: Crista Elliot, PA-C   CC:  DOD Tachycardia  History of Present Illness:    Richard Vazquez is a 66 y.o. male with a hx of AFL, Carotid disease who presents for eval 09/21/21.  Patient notes that he is doing Ok- has had significant sinus issues.  He has not taken afrin, and is feeling prety tired. Has noted irregular heart rates that he feels is associated with his sinus infection.  He has not missed any eliquis. Works 350 in the afternoon to 2 AM. Works as a Designer, jewellery.      No chest pain or pressure .  No SOB/DOE and no PND/Orthopnea.  No weight gain or leg swelling.  Has felt some palpitations.  When in AFL he usually feels tired.  No bleeding issues or upcoming surgery     Past Medical History:  Diagnosis Date   Carotid artery disease ([place])    Carotid US 12/22: R 40-59; L 1-39   Hyperlipemia    Kidney stones    Obesity    Paroxysmal atrial flutter ([place]) 05/29/2021    Past Surgical History:  Procedure Laterality Date   APPENDECTOMY     CARDIOVERSION N/A 05/31/2021   Procedure: CARDIOVERSION;  Surgeon: [name], MD;  Location: [place];  Service: Cardiovascular;  Laterality: N/A;   TEE WITHOUT CARDIOVERSION N/A 05/31/2021   Procedure: TRANSESOPHAGEAL ECHOCARDIOGRAM (TEE);  Surgeon: [name], MD;  Location[facility] ENDOSCOPY;  Service: Cardiovascular;  Laterality: N/A;    Current Medications: Current Meds  Medication Sig   amoxicillin-clavulanate (AUGMENTIN) 875-125 MG tablet Take 1 tablet by mouth 2 (two) times daily.   apixaban (ELIQUIS) 5 MG TABS tablet Take 1 tablet (5 mg total) by mouth 2 (two) times daily.   aspirin-acetaminophen-caffeine (EXCEDRIN MIGRAINE) 250-250-65 MG tablet Take 2 tablets by  mouth every 6 (six) hours as needed for headache.   bisacodyl (DULCOLAX) 5 MG EC tablet Take 5 mg by mouth as needed for moderate constipation.   diltiazem (CARDIZEM CD) 180 MG 24 hr capsule Take 1 capsule (180 mg total) by mouth daily.   Iron, Ferrous Sulfate, 325 (65 Fe) MG TABS Take 1 tablet by mouth every other day.   Omega-3 1000 MG CAPS Take 2 capsules by mouth daily.   OVER THE COUNTER MEDICATION Take 2 capsules by mouth daily. Healthy Beet supplement   polycarbophil (FIBERCON) 625 MG tablet Take 1,250 mg by mouth daily.   rosuvastatin (CRESTOR) 20 MG tablet Take 1 tablet (20 mg total) by mouth daily.   vitamin B-12 (CYANOCOBALAMIN) 500 MCG tablet Take 500 mcg by mouth daily.     Allergies:   Atorvastatin, Other, Codeine, and Latex   Social History   Socioeconomic History   Marital status: Married    Spouse name: Not on file   Number of children: Not on file   Years of education: Not on file   Highest education level: Not on file  Occupational History   Not on file  Tobacco Use   Smoking status: Former    Packs/day: 1.50    Years: 40.00    Pack years: 60.00    Types: Cigarettes    Quit date: 12/08/2020    Years since quitting:  0.7    Passive exposure: Past   Smokeless tobacco: Never  Vaping Use   Vaping Use: Never used  Substance and Sexual Activity   Alcohol use: Not on file   Drug use: Not on file   Sexual activity: Not on file  Other Topics Concern   Not on file  Social History Narrative   Not on file   Social Determinants of Health   Financial Resource Strain: Not on file  Food Insecurity: Not on file  Transportation Needs: Not on file  Physical Activity: Not on file  Stress: Not on file  Social Connections: Not on file     Family History: History of coronary artery disease notable for no members. History of heart failure notable for no members. History of arrhythmia notable for no members. Father has aortic stenosis.  ROS:   Please see the  history of present illness.     All other systems reviewed and are negative.  EKGs/Labs/Other Studies Reviewed:    The following studies were reviewed today:  EKG:  EKG is  ordered today.  The ekg ordered today demonstrates  09/21/21: AFL RVR rate 135  Recent Labs: 05/29/2021: B Natriuretic Peptide 285.6 05/31/2021: Magnesium 2.0 08/05/2021: ALT 15; BUN 22; Creatinine, Ser 0.98; Hemoglobin 14.3; Platelets 260; Potassium 4.4; Sodium 141; TSH 3.570  Recent Lipid Panel    Component Value Date/Time   CHOL 183 08/05/2021 0924   TRIG 115 08/05/2021 0924   HDL 36 (L) 08/05/2021 0924   CHOLHDL 5.1 (H) 08/05/2021 0924   LDLCALC 126 (H) 08/05/2021 0924     Risk Assessment/Calculations:    CHA2DS2-VASc Score = 2   This indicates a 2.2% annual risk of stroke. The patient's score is based upon: CHF History: 0 HTN History: 0 Diabetes History: 0 Stroke History: 0 Vascular Disease History: 1 Age Score: 1 Gender Score: 0          Physical Exam:    VS:  BP 108/78    Pulse (!) 136    Ht 6\' 2"  (1.88 m)    Wt 123.4 kg    SpO2 95%    BMI 34.92 kg/m     Wt Readings from Last 3 Encounters:  09/21/21 123.4 kg  08/05/21 122.7 kg  06/22/21 123.4 kg     Gen: No distress Neck: No JVD,  Ears: Billateral Frank Sign Cardiac: No Rubs or Gallops, holosystolic Murmur, regular tachycardia, +2 radial pulses Respiratory: Clear to auscultation bilaterally, normal effort, normal  respiratory rate GI: Soft, nontender, non-distended  MS: No  edema;  moves all extremities Integument: Skin feels warm Neuro:  At time of evaluation, alert and oriented to person/place/time/situation  Psych: Normal affect, patient feels OK   ASSESSMENT:    1. Atypical atrial flutter (HCC)   2. Paroxysmal atrial flutter (HCC)   3. Carotid artery disease, unspecified laterality, unspecified type ([place])    PLAN:    AFL with RVR Carotid artery disease - CHADSVASC=2. - TSH normal 12/22 - discussed alcohol and  exercise as preventive factors (no clear triggers) - Continue anticoagulation with DOAC, continue diltiazem - second episode in less than a year.  Recommending DCCV (has missed no AC), and possible ablation (will need Post ablation EP follow up       Shared Decision Making/Informed Consent The risks (stroke, cardiac arrhythmias rarely resulting in the need for a temporary or permanent pacemaker, skin irritation or burns and complications associated with conscious sedation including aspiration, arrhythmia, respiratory failure and death),  benefits (restoration of normal sinus rhythm) and alternatives of a direct current cardioversion were explained in detail to Mr. Edghill and he agrees to proceed.      Medication Adjustments/Labs and Tests Ordered: Current medicines are reviewed at length with the patient today.  Concerns regarding medicines are outlined above.  Orders Placed This Encounter  Procedures   Ambulatory referral to Cardiac Electrophysiology   EKG 12-Lead   No orders of the defined types were placed in this encounter.   Patient Instructions  Medication Instructions:  Your physician recommends that you continue on your current medications as directed. Please refer to the Current Medication list given to you today.  *If you need a refill on your cardiac medications before your next appointment, please call your pharmacy*   Lab Work: NONE If you have labs (blood work) drawn today and your tests are completely normal, you will receive your results only by: [place] (if you have MyChart) OR A paper copy in the mail If you have any lab test that is abnormal or we need to change your treatment, we will call you to review the results.   Testing/Procedures: Your physician has recommended that you have a Cardioversion (DCCV). Electrical Cardioversion uses a jolt of electricity to your heart either through paddles or wired patches attached to your chest. This is a  controlled, usually prescheduled, procedure. Defibrillation is done under light anesthesia in the hospital, and you usually go home the day of the procedure. This is done to get your heart back into a normal rhythm. You are not awake for the procedure. Please see the instruction sheet given to you today.   Your physician has referred you to see an Electrophysiologist. (EP) provider.    Follow-Up: As scheduled At [facility], you and your health needs are our priority.  As part of our continuing mission to provide you with exceptional heart care, we have created designated Provider Care Teams.  These Care Teams include your primary Cardiologist (physician) and Advanced Practice Providers (APPs -  Physician Assistants and Nurse Practitioners) who all work together to provide you with the care you need, when you need it.   The format for your next appointment:   In Person  Provider:   Dorris Carnes, MD     Other Instructions  You are scheduled for a Cardioversion/TEE on:                    Wednesday October 05, 2021   with Dr. Debara Pickett.  Please arrive at the [facility] (Main Entrance A) at [facility]: [address] [place],  [address] at 10:30 am.  DIET: Nothing to eat or drink after midnight except a sip of water with medications (see medication instructions below)  FYI: For your safety, and to allow [place] to monitor your vital signs accurately during the surgery/procedure we request that   if you have artificial nails, gel coating, SNS etc. Please have those removed prior to your surgery/procedure. Not having the nail coverings /polish removed may result in cancellation or delay of your surgery/procedure.   Medication Instructions: Continue your anticoagulant: Eliquis You will need to continue your anticoagulant after your procedure until you  are told by your Provider that it is safe to stop   Labs: Your lab work will be done at the hospital prior to your procedure - you  will need to arrive 1  hours ahead of your procedure  You must have a responsible  person to drive you home and stay in the waiting area during your procedure. Failure to do so could result in cancellation.  Bring your insurance cards.  *Special Note: Every effort is made to have your procedure done on time. Occasionally there are emergencies that occur at the hospital that may cause delays. Please be patient if a delay does occur.      Signed, Werner Lean, MD  09/21/2021 12:29 PM    [place]

## 2021-09-21 NOTE — Telephone Encounter (Signed)
Pt would like to know with his afib and atrial flutter what kind of cough medicine is recommended... please advise

## 2021-09-21 NOTE — Telephone Encounter (Signed)
Called patient with advisement from PharmD. Patient verbalized understanding.

## 2021-09-21 NOTE — Telephone Encounter (Signed)
Recommend trying a medication that contains only guaifenesin such as Mucinex

## 2021-09-21 NOTE — Progress Notes (Signed)
Cardiology Office Note:    Date:  09/21/2021   ID:  Richard Vazquez, DOB [birth year], MRN [id number]  PCP:  Crista Elliot, PA-C   [facility] Providers Cardiologist:  Dorris Carnes, MD Cardiology APP:  Sharmon Revere     Referring MD: Crista Elliot, PA-C   CC:  DOD Tachycardia  History of Present Illness:    Richard Vazquez is a 66 y.o. male with a hx of AFL, Carotid disease who presents for eval 09/21/21.  Patient notes that he is doing Ok- has had significant sinus issues.  He has not taken afrin, and is feeling prety tired. Has noted irregular heart rates that he feels is associated with his sinus infection.  He has not missed any eliquis. Works 350 in the afternoon to 2 AM. Works as a Designer, jewellery.      No chest pain or pressure .  No SOB/DOE and no PND/Orthopnea.  No weight gain or leg swelling.  Has felt some palpitations.  When in AFL he usually feels tired.  No bleeding issues or upcoming surgery     Past Medical History:  Diagnosis Date   Carotid artery disease ([place])    Carotid US 12/22: R 40-59; L 1-39   Hyperlipemia    Kidney stones    Obesity    Paroxysmal atrial flutter ([place]) 05/29/2021    Past Surgical History:  Procedure Laterality Date   APPENDECTOMY     CARDIOVERSION N/A 05/31/2021   Procedure: CARDIOVERSION;  Surgeon: [name], MD;  Location: [place];  Service: Cardiovascular;  Laterality: N/A;   TEE WITHOUT CARDIOVERSION N/A 05/31/2021   Procedure: TRANSESOPHAGEAL ECHOCARDIOGRAM (TEE);  Surgeon: [name], MD;  Location[facility] ENDOSCOPY;  Service: Cardiovascular;  Laterality: N/A;    Current Medications: Current Meds  Medication Sig   amoxicillin-clavulanate (AUGMENTIN) 875-125 MG tablet Take 1 tablet by mouth 2 (two) times daily.   apixaban (ELIQUIS) 5 MG TABS tablet Take 1 tablet (5 mg total) by mouth 2 (two) times daily.   aspirin-acetaminophen-caffeine (EXCEDRIN MIGRAINE) 250-250-65 MG tablet Take 2 tablets by  mouth every 6 (six) hours as needed for headache.   bisacodyl (DULCOLAX) 5 MG EC tablet Take 5 mg by mouth as needed for moderate constipation.   diltiazem (CARDIZEM CD) 180 MG 24 hr capsule Take 1 capsule (180 mg total) by mouth daily.   Iron, Ferrous Sulfate, 325 (65 Fe) MG TABS Take 1 tablet by mouth every other day.   Omega-3 1000 MG CAPS Take 2 capsules by mouth daily.   OVER THE COUNTER MEDICATION Take 2 capsules by mouth daily. Healthy Beet supplement   polycarbophil (FIBERCON) 625 MG tablet Take 1,250 mg by mouth daily.   rosuvastatin (CRESTOR) 20 MG tablet Take 1 tablet (20 mg total) by mouth daily.   vitamin B-12 (CYANOCOBALAMIN) 500 MCG tablet Take 500 mcg by mouth daily.     Allergies:   Atorvastatin, Other, Codeine, and Latex   Social History   Socioeconomic History   Marital status: Married    Spouse name: Not on file   Number of children: Not on file   Years of education: Not on file   Highest education level: Not on file  Occupational History   Not on file  Tobacco Use   Smoking status: Former    Packs/day: 1.50    Years: 40.00    Pack years: 60.00    Types: Cigarettes    Quit date: 12/08/2020    Years since quitting:  0.7    Passive exposure: Past   Smokeless tobacco: Never  Vaping Use   Vaping Use: Never used  Substance and Sexual Activity   Alcohol use: Not on file   Drug use: Not on file   Sexual activity: Not on file  Other Topics Concern   Not on file  Social History Narrative   Not on file   Social Determinants of Health   Financial Resource Strain: Not on file  Food Insecurity: Not on file  Transportation Needs: Not on file  Physical Activity: Not on file  Stress: Not on file  Social Connections: Not on file     Family History: History of coronary artery disease notable for no members. History of heart failure notable for no members. History of arrhythmia notable for no members. Father has aortic stenosis.  ROS:   Please see the  history of present illness.     All other systems reviewed and are negative.  EKGs/Labs/Other Studies Reviewed:    The following studies were reviewed today:  EKG:  EKG is  ordered today.  The ekg ordered today demonstrates  09/21/21: AFL RVR rate 135  Recent Labs: 05/29/2021: B Natriuretic Peptide 285.6 05/31/2021: Magnesium 2.0 08/05/2021: ALT 15; BUN 22; Creatinine, Ser 0.98; Hemoglobin 14.3; Platelets 260; Potassium 4.4; Sodium 141; TSH 3.570  Recent Lipid Panel    Component Value Date/Time   CHOL 183 08/05/2021 0924   TRIG 115 08/05/2021 0924   HDL 36 (L) 08/05/2021 0924   CHOLHDL 5.1 (H) 08/05/2021 0924   LDLCALC 126 (H) 08/05/2021 0924     Risk Assessment/Calculations:    CHA2DS2-VASc Score = 2   This indicates a 2.2% annual risk of stroke. The patient's score is based upon: CHF History: 0 HTN History: 0 Diabetes History: 0 Stroke History: 0 Vascular Disease History: 1 Age Score: 1 Gender Score: 0          Physical Exam:    VS:  BP 108/78    Pulse (!) 136    Ht 6\' 2"  (1.88 m)    Wt 123.4 kg    SpO2 95%    BMI 34.92 kg/m     Wt Readings from Last 3 Encounters:  09/21/21 123.4 kg  08/05/21 122.7 kg  06/22/21 123.4 kg     Gen: No distress Neck: No JVD,  Ears: Billateral Frank Sign Cardiac: No Rubs or Gallops, holosystolic Murmur, regular tachycardia, +2 radial pulses Respiratory: Clear to auscultation bilaterally, normal effort, normal  respiratory rate GI: Soft, nontender, non-distended  MS: No  edema;  moves all extremities Integument: Skin feels warm Neuro:  At time of evaluation, alert and oriented to person/place/time/situation  Psych: Normal affect, patient feels OK   ASSESSMENT:    1. Atypical atrial flutter (HCC)   2. Paroxysmal atrial flutter (HCC)   3. Carotid artery disease, unspecified laterality, unspecified type ([place])    PLAN:    AFL with RVR Carotid artery disease - CHADSVASC=2. - TSH normal 12/22 - discussed alcohol and  exercise as preventive factors (no clear triggers) - Continue anticoagulation with DOAC, continue diltiazem - second episode in less than a year.  Recommending DCCV (has missed no AC), and possible ablation (will need Post ablation EP follow up       Shared Decision Making/Informed Consent The risks (stroke, cardiac arrhythmias rarely resulting in the need for a temporary or permanent pacemaker, skin irritation or burns and complications associated with conscious sedation including aspiration, arrhythmia, respiratory failure and death),  benefits (restoration of normal sinus rhythm) and alternatives of a direct current cardioversion were explained in detail to Mr. Spickard and he agrees to proceed.      Medication Adjustments/Labs and Tests Ordered: Current medicines are reviewed at length with the patient today.  Concerns regarding medicines are outlined above.  Orders Placed This Encounter  Procedures   Ambulatory referral to Cardiac Electrophysiology   EKG 12-Lead   No orders of the defined types were placed in this encounter.   Patient Instructions  Medication Instructions:  Your physician recommends that you continue on your current medications as directed. Please refer to the Current Medication list given to you today.  *If you need a refill on your cardiac medications before your next appointment, please call your pharmacy*   Lab Work: NONE If you have labs (blood work) drawn today and your tests are completely normal, you will receive your results only by: [place] (if you have MyChart) OR A paper copy in the mail If you have any lab test that is abnormal or we need to change your treatment, we will call you to review the results.   Testing/Procedures: Your physician has recommended that you have a Cardioversion (DCCV). Electrical Cardioversion uses a jolt of electricity to your heart either through paddles or wired patches attached to your chest. This is a  controlled, usually prescheduled, procedure. Defibrillation is done under light anesthesia in the hospital, and you usually go home the day of the procedure. This is done to get your heart back into a normal rhythm. You are not awake for the procedure. Please see the instruction sheet given to you today.   Your physician has referred you to see an Electrophysiologist. (EP) provider.    Follow-Up: As scheduled At [facility], you and your health needs are our priority.  As part of our continuing mission to provide you with exceptional heart care, we have created designated Provider Care Teams.  These Care Teams include your primary Cardiologist (physician) and Advanced Practice Providers (APPs -  Physician Assistants and Nurse Practitioners) who all work together to provide you with the care you need, when you need it.   The format for your next appointment:   In Person  Provider:   Dorris Carnes, MD     Other Instructions  You are scheduled for a Cardioversion/TEE on:                    Wednesday October 05, 2021   with Dr. Debara Pickett.  Please arrive at the [facility] (Main Entrance A) at [facility]: [address] [place], [place] [address] at 10:30 am.  DIET: Nothing to eat or drink after midnight except a sip of water with medications (see medication instructions below)  FYI: For your safety, and to allow [place] to monitor your vital signs accurately during the surgery/procedure we request that   if you have artificial nails, gel coating, SNS etc. Please have those removed prior to your surgery/procedure. Not having the nail coverings /polish removed may result in cancellation or delay of your surgery/procedure.   Medication Instructions: Continue your anticoagulant: Eliquis You will need to continue your anticoagulant after your procedure until you  are told by your Provider that it is safe to stop   Labs: Your lab work will be done at the hospital prior to your procedure - you  will need to arrive 1  hours ahead of your procedure  You must have a responsible  person to drive you home and stay in the waiting area during your procedure. Failure to do so could result in cancellation.  Bring your insurance cards.  *Special Note: Every effort is made to have your procedure done on time. Occasionally there are emergencies that occur at the hospital that may cause delays. Please be patient if a delay does occur.      Signed, Werner Lean, MD  09/21/2021 12:29 PM    [place]

## 2021-09-21 NOTE — Patient Instructions (Signed)
Medication Instructions:  Your physician recommends that you continue on your current medications as directed. Please refer to the Current Medication list given to you today.  *If you need a refill on your cardiac medications before your next appointment, please call your pharmacy*   Lab Work: NONE If you have labs (blood work) drawn today and your tests are completely normal, you will receive your results only by: MyChart Message (if you have MyChart) OR A paper copy in the mail If you have any lab test that is abnormal or we need to change your treatment, we will call you to review the results.   Testing/Procedures: Your physician has recommended that you have a Cardioversion (DCCV). Electrical Cardioversion uses a jolt of electricity to your heart either through paddles or wired patches attached to your chest. This is a controlled, usually prescheduled, procedure. Defibrillation is done under light anesthesia in the hospital, and you usually go home the day of the procedure. This is done to get your heart back into a normal rhythm. You are not awake for the procedure. Please see the instruction sheet given to you today.   Your physician has referred you to see an Electrophysiologist. (EP) provider.    Follow-Up: As scheduled At [facility], you and your health needs are our priority.  As part of our continuing mission to provide you with exceptional heart care, we have created designated Provider Care Teams.  These Care Teams include your primary Cardiologist (physician) and Advanced Practice Providers (APPs -  Physician Assistants and Nurse Practitioners) who all work together to provide you with the care you need, when you need it.   The format for your next appointment:   In Person  Provider:   Dietrich Pates, MD     Other Instructions  You are scheduled for a Cardioversion/TEE on:                    Wednesday October 05, 2021   with Dr. Rennis Golden.  Please arrive at the [facility]  (Main Entrance A) at [facility]: [address] [place], [place] [address] at 10:30 am.  DIET: Nothing to eat or drink after midnight except a sip of water with medications (see medication instructions below)  FYI: For your safety, and to allow [place] to monitor your vital signs accurately during the surgery/procedure we request that   if you have artificial nails, gel coating, SNS etc. Please have those removed prior to your surgery/procedure. Not having the nail coverings /polish removed may result in cancellation or delay of your surgery/procedure.   Medication Instructions: Continue your anticoagulant: Eliquis You will need to continue your anticoagulant after your procedure until you  are told by your Provider that it is safe to stop   Labs: Your lab work will be done at the hospital prior to your procedure - you will need to arrive 1  hours ahead of your procedure  You must have a responsible person to drive you home and stay in the waiting area during your procedure. Failure to do so could result in cancellation.  Bring your insurance cards.  *Special Note: Every effort is made to have your procedure done on time. Occasionally there are emergencies that occur at the hospital that may cause delays. Please be patient if a delay does occur.

## 2021-09-27 ENCOUNTER — Encounter (HOSPITAL_COMMUNITY): Payer: Self-pay | Admitting: Internal Medicine

## 2021-09-27 NOTE — Progress Notes (Signed)
Attempted to obtain medical history via telephone, unable to reach at this time. I left a voicemail to return pre surgical testing department's phone call.  

## 2021-09-28 ENCOUNTER — Telehealth: Payer: Self-pay | Admitting: *Deleted

## 2021-09-28 DIAGNOSIS — R29818 Other symptoms and signs involving the nervous system: Secondary | ICD-10-CM

## 2021-09-28 NOTE — Telephone Encounter (Signed)
The patient has been notified of the result and verbalized understanding.  All questions (if any) were answered. Latrelle Dodrill, CMA 09/28/2021 4:30 PM    [employer]

## 2021-09-28 NOTE — Telephone Encounter (Signed)
-----   Message from Quintella Reichert, MD sent at 09/12/2021  5:55 PM EST ----- Please let patient know that they have sleep apnea.  Recommend therapeutic CPAP titration for treatment of patient's sleep disordered breathing.  If unable to perform an in lab titration then initiate ResMed auto CPAP from 4 to 15cm H2O with heated humidity and mask of choice and overnight pulse ox on CPAP.

## 2021-10-05 ENCOUNTER — Encounter (HOSPITAL_COMMUNITY)
Admission: RE | Disposition: A | Discharge status: Home / Self Care | Payer: Self-pay | Source: Home / Self Care | Attending: Internal Medicine

## 2021-10-05 ENCOUNTER — Ambulatory Visit (HOSPITAL_COMMUNITY): Payer: Medicare Other | Admitting: Anesthesiology

## 2021-10-05 ENCOUNTER — Other Ambulatory Visit: Payer: Self-pay

## 2021-10-05 ENCOUNTER — Encounter (HOSPITAL_COMMUNITY): Payer: Self-pay | Admitting: Internal Medicine

## 2021-10-05 ENCOUNTER — Ambulatory Visit (HOSPITAL_COMMUNITY)
Admission: RE | Admit: 2021-10-05 | Discharge: 2021-10-05 | Disposition: A | Discharge status: Home / Self Care | Payer: Medicare Other | Attending: Internal Medicine | Admitting: Internal Medicine

## 2021-10-05 DIAGNOSIS — I4892 Unspecified atrial flutter: Secondary | ICD-10-CM | POA: Diagnosis not present

## 2021-10-05 DIAGNOSIS — Z7901 Long term (current) use of anticoagulants: Secondary | ICD-10-CM | POA: Insufficient documentation

## 2021-10-05 DIAGNOSIS — I484 Atypical atrial flutter: Secondary | ICD-10-CM | POA: Insufficient documentation

## 2021-10-05 DIAGNOSIS — I779 Disorder of arteries and arterioles, unspecified: Secondary | ICD-10-CM | POA: Insufficient documentation

## 2021-10-05 DIAGNOSIS — I4819 Other persistent atrial fibrillation: Secondary | ICD-10-CM

## 2021-10-05 HISTORY — PX: CARDIOVERSION: SHX1299

## 2021-10-05 LAB — POCT I-STAT, CHEM 8
BUN: 19 mg/dL (ref 8–23)
Calcium, Ion: 1.11 mmol/L — ABNORMAL LOW (ref 1.15–1.40)
Chloride: 108 mmol/L (ref 98–111)
Creatinine, Ser: 0.9 mg/dL (ref 0.61–1.24)
Glucose, Bld: 117 mg/dL — ABNORMAL HIGH (ref 70–99)
HCT: 43 % (ref 39.0–52.0)
Hemoglobin: 14.6 g/dL (ref 13.0–17.0)
Potassium: 4 mmol/L (ref 3.5–5.1)
Sodium: 141 mmol/L (ref 135–145)
TCO2: 24 mmol/L (ref 22–32)

## 2021-10-05 SURGERY — CARDIOVERSION
Anesthesia: General

## 2021-10-05 MED ORDER — PROPOFOL 10 MG/ML IV BOLUS
INTRAVENOUS | Status: DC | PRN
  Administered 2021-10-05: 100 mg via INTRAVENOUS

## 2021-10-05 MED ORDER — SODIUM CHLORIDE 0.9 % IV SOLN: INTRAVENOUS | Status: DC

## 2021-10-05 MED ORDER — LIDOCAINE 2% (20 MG/ML) 5 ML SYRINGE
INTRAMUSCULAR | Status: DC | PRN
  Administered 2021-10-05: 100 mg via INTRAVENOUS

## 2021-10-05 NOTE — Interval H&P Note (Signed)
History and Physical Interval Note:  10/05/2021 11:10 AM  Richard Vazquez  has presented today for surgery, with the diagnosis of AFLUTTER.  The various methods of treatment have been discussed with the patient and family. After consideration of risks, benefits and other options for treatment, the patient has consented to  Procedure(s): CARDIOVERSION (N/A) as a surgical intervention.  The patient's history has been reviewed, patient examined, no change in status, stable for surgery.  I have reviewed the patient's chart and labs.  Questions were answered to the patient's satisfaction.     Chrystie Nose

## 2021-10-05 NOTE — CV Procedure (Signed)
° °  CARDIOVERSION NOTE  Procedure: Electrical Cardioversion Indications:  Atrial Fibrillation  Procedure Details:  Consent: Risks of procedure as well as the alternatives and risks of each were explained to the (patient/caregiver).  Consent for procedure obtained.  Time Out: Verified patient identification, verified procedure, site/side was marked, verified correct patient position, special equipment/implants available, medications/allergies/relevent history reviewed, required imaging and test results available.  Performed  Patient placed on cardiac monitor, pulse oximetry, supplemental oxygen as necessary.  Sedation given:  propofol per anesthesia Pacer pads placed anterior and posterior chest.  Cardioverted 1 time(s).  Cardioverted at 150J biphasic.  Impression: Findings: Post procedure EKG shows: NSR Complications: None Patient did tolerate procedure well.  Plan: Successful DCCV with a single 150J biphasic shock.  Time Spent Directly with the Patient:  30 minutes   Chrystie Nose, MD, [facility], FACP  [place]   [facility]  Medical Director of the Advanced Lipid Disorders &  Cardiovascular Risk Reduction Clinic Diplomate of the American Board of Clinical Lipidology Attending Cardiologist  Direct Dial: [phone]   Fax: [phone]  Website:  [email or web address] Nicely Adianna Darwin 10/05/2021, 11:55 AM

## 2021-10-05 NOTE — Anesthesia Preprocedure Evaluation (Addendum)
Anesthesia Evaluation  Patient identified by MRN, date of birth, ID band Patient awake    Reviewed: Allergy & Precautions, NPO status , Patient's Chart, lab work & pertinent test results  History of Anesthesia Complications Negative for: history of anesthetic complications  Airway Mallampati: II  TM Distance: >3 FB Neck ROM: Full    Dental no notable dental hx.    Pulmonary former smoker,    Pulmonary exam normal        Cardiovascular Normal cardiovascular exam+ dysrhythmias (on Eliquis) Atrial Fibrillation   Echo 05/2021: EF 65-70%, mild LVH, valves ok     Neuro/Psych negative neurological ROS  negative psych ROS   GI/Hepatic negative GI ROS, Neg liver ROS,   Endo/Other  negative endocrine ROS  Renal/GU negative Renal ROS  negative genitourinary   Musculoskeletal negative musculoskeletal ROS (+)   Abdominal   Peds  Hematology negative hematology ROS (+)   Anesthesia Other Findings Day of surgery medications reviewed with patient.  Reproductive/Obstetrics negative OB ROS                            Anesthesia Physical Anesthesia Plan  ASA: 3  Anesthesia Plan: General   Post-op Pain Management: Minimal or no pain anticipated   Induction: Intravenous  PONV Risk Score and Plan: Treatment may vary due to age or medical condition and Propofol infusion  Airway Management Planned: Mask  Additional Equipment: None  Intra-op Plan:   Post-operative Plan:   Informed Consent: I have reviewed the patients History and Physical, chart, labs and discussed the procedure including the risks, benefits and alternatives for the proposed anesthesia with the patient or authorized representative who has indicated his/her understanding and acceptance.       Plan Discussed with: CRNA  Anesthesia Plan Comments:        Anesthesia Quick Evaluation

## 2021-10-05 NOTE — Anesthesia Postprocedure Evaluation (Signed)
Anesthesia Post Note  Patient: Richard Vazquez  Procedure(s) Performed: CARDIOVERSION     Patient location during evaluation: PACU Anesthesia Type: General Level of consciousness: awake and alert Pain management: pain level controlled Vital Signs Assessment: post-procedure vital signs reviewed and stable Respiratory status: spontaneous breathing, nonlabored ventilation and respiratory function stable Cardiovascular status: blood pressure returned to baseline Postop Assessment: no apparent nausea or vomiting Anesthetic complications: no   No notable events documented.  Last Vitals:  Vitals:   10/05/21 1210 10/05/21 1220  BP: 111/79 (!) 118/94  Pulse: 87 79  Resp: (!) 22 17  Temp:    SpO2: 96% 97%    Last Pain:  Vitals:   10/05/21 1220  TempSrc:   PainSc: 0-No pain                 Shanda Howells

## 2021-10-05 NOTE — Transfer of Care (Signed)
Immediate Anesthesia Transfer of Care Note  Patient: Richard Vazquez  Procedure(s) Performed: CARDIOVERSION  Patient Location: Endoscopy Unit  Anesthesia Type:General  Level of Consciousness: drowsy and patient cooperative  Airway & Oxygen Therapy: Patient Spontanous Breathing  Post-op Assessment: Report given to RN and Post -op Vital signs reviewed and stable  Post vital signs: Reviewed and stable  Last Vitals:  Vitals Value Taken Time  BP    Temp    Pulse    Resp    SpO2      Last Pain:  Vitals:   10/05/21 1051  PainSc: 0-No pain         Complications: No notable events documented.

## 2021-10-08 ENCOUNTER — Encounter (HOSPITAL_COMMUNITY): Payer: Self-pay | Admitting: Internal Medicine

## 2021-10-21 DIAGNOSIS — Z87442 Personal history of urinary calculi: Secondary | ICD-10-CM | POA: Diagnosis not present

## 2021-10-21 DIAGNOSIS — N200 Calculus of kidney: Secondary | ICD-10-CM | POA: Diagnosis not present

## 2021-10-21 DIAGNOSIS — N400 Enlarged prostate without lower urinary tract symptoms: Secondary | ICD-10-CM | POA: Diagnosis not present

## 2021-10-24 NOTE — Progress Notes (Addendum)
? ?Cardiology Office Note ? ? ?Date:  10/28/2021  ? ?ID:  Richard Vazquez, DOB June 11, 1956, MRN [id number] ? ?PCP:  Crista Elliot, PA-C  ?Cardiologist:   Dorris Carnes, MD  ? ? ?Patient presents for follow-up of atrial flutter. ?  ?History of Present Illness: ? ?Richard Vazquez is a 66 year old gentleman with a history of cerebrovascular disease, atrial flutter.  I saw him in October 2022.  At the time he presented to [facility] with some heaviness in his chest and heart rate staying.  Found to be in atrial flutter at the time with heart rates in the 120s.  BNP minimally elevated to 85.  Blood pressure mildly elevated.  (CHA2DS2-VASc 2).  He underwent echo and then TEE cardioversion to sinus rhythm.  Felt better when he was in sinus rhythm. ? ?The patient was seen by Dr. Ezra Sites back in January.  He was had a recurrence of atrial flutter.  He senses the palpitations.  Associated with some shortness of breath some chest tightness.  He underwent cardioversion on February 8 without complication.  Converted to sinus rhythm. ? ?Since then he says his heart rates have been up and down he brings in his blood pressure monitor and it shows pulses in the 70s to 120s.  He says he can feel his heart racing.  He has some fatigue with this. ? ?He has already been set up to see Dr. Caryl Comes on March 13. ? ?Note the patient was placed on indapamide for kidney stones. ? ?Eager to lose weight.  Wants to know more dietary changes he needs to make ?Breakfast:   bagel or cereall   Occasional egg ?Lunch    Microwave meal ?Dinner   Varies   Does like bread   starches   ? ?Current Meds  ?Medication Sig  ? apixaban (ELIQUIS) 5 MG TABS tablet Take 1 tablet (5 mg total) by mouth 2 (two) times daily.  ? aspirin-acetaminophen-caffeine (EXCEDRIN MIGRAINE) 250-250-65 MG tablet Take 2 tablets by mouth every 6 (six) hours as needed for headache.  ? bisacodyl (DULCOLAX) 5 MG EC tablet Take 5 mg by mouth daily as needed for moderate constipation.  ?  diltiazem (CARDIZEM CD) 180 MG 24 hr capsule Take 1 capsule (180 mg total) by mouth daily.  ? indapamide (LOZOL) 2.5 MG tablet Take 2.5 mg by mouth daily.  ? Iron, Ferrous Sulfate, 325 (65 Fe) MG TABS Take 325 mg by mouth every other day.  ? metoprolol tartrate (LOPRESSOR) 25 MG tablet Take one tablet (25 mg) as needed for heart racing.  ? Omega-3 1000 MG CAPS Take 1,000 mg by mouth daily.  ? OVER THE COUNTER MEDICATION Take 2 capsules by mouth daily. Healthy Beet supplement  ? polycarbophil (FIBERCON) 625 MG tablet Take 1,250 mg by mouth daily.  ? rosuvastatin (CRESTOR) 20 MG tablet Take 1 tablet (20 mg total) by mouth daily.  ? vitamin B-12 (CYANOCOBALAMIN) 500 MCG tablet Take 500 mcg by mouth daily.  ? ? ? ?Allergies:   Atorvastatin, Codeine, and Latex  ? ?Past Medical History:  ?Diagnosis Date  ? Carotid artery disease ([place])   ? Carotid US 12/22: R 40-59; L 1-39  ? Hyperlipemia   ? Kidney stones   ? Obesity   ? Paroxysmal atrial flutter ([place]) 05/29/2021  ? ? ?Past Surgical History:  ?Procedure Laterality Date  ? APPENDECTOMY    ? CARDIOVERSION N/A 05/31/2021  ? Procedure: CARDIOVERSION;  Surgeon: [name], MD;  Location: [place] [facility];  Service: Cardiovascular;  Laterality: N/A;  ? CARDIOVERSION N/A 10/05/2021  ? Procedure: CARDIOVERSION;  Surgeon: Hilty, Kenneth C, MD;  Location: [facility];  Service: Cardiovascular;  Laterality: N/A;  ? TEE WITHOUT CARDIOVERSION N/A 05/31/2021  ? Procedure: TRANSESOPHAGEAL ECHOCARDIOGRAM (TEE);  Surgeon: Hilty, Kenneth C, MD;  Location: [facility];  Service: Cardiovascular;  Laterality: N/A;  ? ? ? ?Social History:  The patient  reports that he quit smoking about 22 months ago. His smoking use included cigarettes. He has a 60.00 pack-year smoking history. He has been exposed to tobacco smoke. He has never used smokeless tobacco.  ? ?Family History:  The patient's family history is not on file.  ? ? ?ROS:  Please see the history of present illness. All other systems are  reviewed and  Negative to the above problem except as noted.  ? ? ?PHYSICAL EXAM: ?VS:  BP (!) 108/52   Pulse 82   Ht 6' 2" (1.88 m)   Wt 273 lb 9.6 oz (124.1 kg)   SpO2 93%   BMI 35.13 kg/m?   ?GEN: Obese 65-year-old in no acute distress  ?HEENT: normal  ?Neck: no JVD, carotid bruits ?Cardiac: no edema  ?Respiratory:  clear to auscultation bilaterally,  ?GI: Distended.  Soft.  Nontender.  No right upper quadrant tenderness ?MS: no deformity Moving all extremities   ?Skin: warm and dry, no rash ?Neuro:  Strength and sensation are intact ?Psych: euthymic mood, full affect ? ? ?EKG:  EKG is ordered today.  Atrial flutter with variable conduction ? ? ?Lipid Panel ?   ?Component Value Date/Time  ? CHOL 183 08/05/2021 0924  ? TRIG 115 08/05/2021 0924  ? HDL 36 (L) 08/05/2021 0924  ? CHOLHDL 5.1 (H) 08/05/2021 0924  ? LDLCALC 126 (H) 08/05/2021 0924  ? ?  ? ?Wt Readings from Last 3 Encounters:  ?10/28/21 273 lb 9.6 oz (124.1 kg)  ?10/05/21 275 lb (124.7 kg)  ?09/21/21 272 lb (123.4 kg)  ?  ? ? ?ASSESSMENT AND PLAN: ? ?1.  Atrial flutter.  Patient is back in atrial flutter.  On his blood pressure monitor the memory shows heart rates from 70s to 140.  I think he may be in and out of atrial flutter he may stay in atrial flutter and just have variable rate control.  He is symptomatic with this with fatigue. ? ?The patient has an appointment with Dr. Klein I would keep this appointment.  We will check electrolytes today.  Check be met.  I would also recommend he have moat metoprolol 25 mg to take as needed if he has a spell where his heart is not well controlled and he is symptomatic. ? ?2 CV disease.  Patient with moderate plaquing on the left common carotid.  This will need to be followed.  Keep on statin ? ?3. HL   Keep on statin   Will check lipids     Neds tighter control given CV disease ? ?4  Obesity.  Discussed low-carb diet, time restricted eating. ? ? ?Current medicines are reviewed at length with the patient  today.  The patient does not have concerns regarding medicines. ? ?Signed, ?Paula [name], MD  ?10/28/2021 9:42 AM    ?[facility] ?[address], [place], [place]  [address] ?Phone: [phone]; Fax: [phone]  ? ? ?

## 2021-10-28 ENCOUNTER — Encounter: Payer: Self-pay | Admitting: Internal Medicine

## 2021-10-28 ENCOUNTER — Ambulatory Visit: Payer: Medicare Other | Admitting: Internal Medicine

## 2021-10-28 ENCOUNTER — Other Ambulatory Visit: Payer: Self-pay

## 2021-10-28 VITALS — BP 108/52 | HR 82 | Ht 74.0 in | Wt 273.6 lb

## 2021-10-28 DIAGNOSIS — I484 Atypical atrial flutter: Secondary | ICD-10-CM

## 2021-10-28 DIAGNOSIS — Z79899 Other long term (current) drug therapy: Secondary | ICD-10-CM

## 2021-10-28 DIAGNOSIS — I4892 Unspecified atrial flutter: Secondary | ICD-10-CM

## 2021-10-28 LAB — CBC
Hematocrit: 44.8 % (ref 37.5–51.0)
Hemoglobin: 14.8 g/dL (ref 13.0–17.7)
MCH: 29.7 pg (ref 26.6–33.0)
MCHC: 33 g/dL (ref 31.5–35.7)
MCV: 90 fL (ref 79–97)
Platelets: 275 10*3/uL (ref 150–450)
RBC: 4.98 x10E6/uL (ref 4.14–5.80)
RDW: 13.2 % (ref 11.6–15.4)
WBC: 9.5 10*3/uL (ref 3.4–10.8)

## 2021-10-28 LAB — BASIC METABOLIC PANEL
BUN/Creatinine Ratio: 22 (ref 10–24)
BUN: 23 mg/dL (ref 8–27)
CO2: 29 mmol/L (ref 20–29)
Calcium: 9.6 mg/dL (ref 8.6–10.2)
Chloride: 99 mmol/L (ref 96–106)
Creatinine, Ser: 1.03 mg/dL (ref 0.76–1.27)
Glucose: 97 mg/dL (ref 70–99)
Potassium: 4 mmol/L (ref 3.5–5.2)
Sodium: 143 mmol/L (ref 134–144)
eGFR: 81 mL/min/{1.73_m2} (ref >59)

## 2021-10-28 MED ORDER — METOPROLOL TARTRATE 25 MG PO TABS: ORAL_TABLET | ORAL | 3 refills | Status: DC

## 2021-10-28 NOTE — Addendum Note (Signed)
Addended by: Bertram Millard on: 10/28/2021 09:49 AM ? ? Modules accepted: Orders ? ?

## 2021-10-28 NOTE — Patient Instructions (Signed)
Medication Instructions:  ?Metoprolol 25 mg as needed  ?*If you need a refill on your cardiac medications before your next appointment, please call your pharmacy* ? ? ?Lab Work: ?BMET, CBC  ?If you have labs (blood work) drawn today and your tests are completely normal, you will receive your results only by: ?MyChart Message (if you have MyChart) OR ?A paper copy in the mail ?If you have any lab test that is abnormal or we need to change your treatment, we will call you to review the results. ? ? ?Testing/Procedures: ?none ? ? ?Follow-Up: ?At [facility], you and your health needs are our priority.  As part of our continuing mission to provide you with exceptional heart care, we have created designated Provider Care Teams.  These Care Teams include your primary Cardiologist (physician) and Advanced Practice Providers (APPs -  Physician Assistants and Nurse Practitioners) who all work together to provide you with the care you need, when you need it. ? ?We recommend signing up for the patient portal called "MyChart".  Sign up information is provided on this After Visit Summary.  MyChart is used to connect with patients for Virtual Visits (Telemedicine).  Patients are able to view lab/test results, encounter notes, upcoming appointments, etc.  Non-urgent messages can be sent to your provider as well.   ?To learn more about what you can do with MyChart, go to [email or web address].   ? ? ?

## 2021-11-02 ENCOUNTER — Other Ambulatory Visit (INDEPENDENT_AMBULATORY_CARE_PROVIDER_SITE_OTHER): Payer: Medicare Other

## 2021-11-02 DIAGNOSIS — I484 Atypical atrial flutter: Secondary | ICD-10-CM

## 2021-11-07 ENCOUNTER — Other Ambulatory Visit: Payer: Self-pay

## 2021-11-07 ENCOUNTER — Encounter: Payer: Self-pay | Admitting: Internal Medicine

## 2021-11-07 ENCOUNTER — Ambulatory Visit: Payer: Medicare Other | Admitting: Internal Medicine

## 2021-11-07 VITALS — BP 122/62 | HR 62 | Ht 76.0 in | Wt 272.0 lb

## 2021-11-07 DIAGNOSIS — I4892 Unspecified atrial flutter: Secondary | ICD-10-CM

## 2021-11-07 NOTE — Progress Notes (Signed)
? ? ? ? ?ELECTROPHYSIOLOGY CONSULT NOTE  ?Patient ID: Richard Vazquez, MRN: [id number], DOB/AGE: [birth year] 66 y.o. ?Admit date: (Not on file) ?Date of Consult: 11/07/2021 ? ?Primary Physician: Crista Elliot, PA-C ?Primary Cardiologist: PR ?  ?  ?Richard Vazquez is a 66 y.o. male who is being seen today for the evaluation of Aflutter  at the request of PR.  ? ? ?HPI ?Richard Vazquez is a 66 y.o. male referred for recurrent atrial flutter. ? ?Initially presented to [facility] 10/22 found to be in atrial flutter.  He underwent TEE guided cardioversion started on anticoagulation.  Recurrent atrial flutter 1/23 with palpitations and some chest pain and shortness of breath.  Again cardioverted with subsequent reversion to atrial flutter ? ?Symptoms with atrial flutter include fatigue, lightheadedness and dyspnea. ? ?No bleeding on the anticoagulation ? ?Sleep disorder breathing   ? ?DATE TEST EF   ?10/22 Echo   >75% % Hyperdynamic with mild LVH  ?      ?     ? ?Date Cr K Hgb  ?3/23 1.03 4.0 14.8  ?      ? ?Thromboembolic risk factors ( age -33, HTN-1, Vasc disease -1 (carotid)) for a CHADSVASc Score of >=3 ? ? ? ?Past Medical History:  ?Diagnosis Date  ? Carotid artery disease ([place])   ? Carotid US 12/22: R 40-59; L 1-39  ? Hyperlipemia   ? Kidney stones   ? Obesity   ? Paroxysmal atrial flutter ([place]) 05/29/2021  ?   ? ?Surgical History:  ?Past Surgical History:  ?Procedure Laterality Date  ? APPENDECTOMY    ? CARDIOVERSION N/A 05/31/2021  ? Procedure: CARDIOVERSION;  Surgeon: [name], MD;  Location: [place];  Service: Cardiovascular;  Laterality: N/A;  ? CARDIOVERSION N/A 10/05/2021  ? Procedure: CARDIOVERSION;  Surgeon: [name], MD;  Location: [facility] ENDOSCOPY;  Service: Cardiovascular;  Laterality: N/A;  ? TEE WITHOUT CARDIOVERSION N/A 05/31/2021  ? Procedure: TRANSESOPHAGEAL ECHOCARDIOGRAM (TEE);  Surgeon: [name], MD;  Location: [place];  Service: Cardiovascular;  Laterality: N/A;  ?  ? ?Home Meds: ?Current  Meds  ?Medication Sig  ? apixaban (ELIQUIS) 5 MG TABS tablet Take 1 tablet (5 mg total) by mouth 2 (two) times daily.  ? aspirin-acetaminophen-caffeine (EXCEDRIN MIGRAINE) 250-250-65 MG tablet Take 2 tablets by mouth every 6 (six) hours as needed for headache.  ? bisacodyl (DULCOLAX) 5 MG EC tablet Take 5 mg by mouth daily as needed for moderate constipation.  ? diltiazem (CARDIZEM CD) 180 MG 24 hr capsule Take 1 capsule (180 mg total) by mouth daily.  ? indapamide (LOZOL) 2.5 MG tablet Take 2.5 mg by mouth daily.  ? Iron, Ferrous Sulfate, 325 (65 Fe) MG TABS Take 325 mg by mouth every other day.  ? metoprolol tartrate (LOPRESSOR) 25 MG tablet Take one tablet (25 mg) as needed for heart racing.  ? Omega-3 1000 MG CAPS Take 1,000 mg by mouth daily.  ? OVER THE COUNTER MEDICATION Take 2 capsules by mouth daily. Healthy Beet supplement  ? polycarbophil (FIBERCON) 625 MG tablet Take 1,250 mg by mouth daily.  ? vitamin B-12 (CYANOCOBALAMIN) 500 MCG tablet Take 500 mcg by mouth daily.  ? ? ?Allergies:  ?Allergies  ?Allergen Reactions  ? Atorvastatin Other (See Comments)  ?  Myalgias  ? Codeine Itching  ? Latex Itching  ? ? ?Social History  ? ?Socioeconomic History  ? Marital status: Married  ?  Spouse name: Not on file  ? Number of  children: Not on file  ? Years of education: Not on file  ? Highest education level: Not on file  ?Occupational History  ? Not on file  ?Tobacco Use  ? Smoking status: Former  ?  Packs/day: 1.50  ?  Years: 40.00  ?  Pack years: 60.00  ?  Types: Cigarettes  ?  Quit date: 12/09/2019  ?  Years since quitting: 1.9  ?  Passive exposure: Past  ? Smokeless tobacco: Never  ?Vaping Use  ? Vaping Use: Never used  ?Substance and Sexual Activity  ? Alcohol use: Not on file  ? Drug use: Not on file  ? Sexual activity: Not on file  ?Other Topics Concern  ? Not on file  ?Social History Narrative  ? Not on file  ? ?Social Determinants of Health  ? ?Financial Resource Strain: Not on file  ?Food Insecurity: Not  on file  ?Transportation Needs: Not on file  ?Physical Activity: Not on file  ?Stress: Not on file  ?Social Connections: Not on file  ?Intimate Partner Violence: Not on file  ?  ? ?History reviewed. No pertinent family history.  ? ?ROS:  Please see the history of present illness.     All other systems reviewed and negative.  ? ? ?Physical Exam: ?Blood pressure 122/62, pulse 62, height 6\' 4"  (1.93 m), weight 272 lb (123.4 kg), SpO2 96 %. ?General: Well developed, Morbidly obese  male in no acute distress. ?Head: Normocephalic, atraumatic, sclera non-icteric, no xanthomas, nares are without discharge. ?EENT: normal  ?Lymph Nodes:  none ?Neck: Negative for carotid bruits. JVD not elevated. ?Back:without scoliosis kyphosis ?Lungs: Clear bilaterally to auscultation without wheezes, rales, or rhonchi. Breathing is unlabored. ?Heart: RRR with S1 S2. No murmur . No rubs, or gallops appreciated. ?Abdomen: Soft, non-tender, non-distended with normoactive bowel sounds. No hepatomegaly. No rebound/guarding. No obvious abdominal masses. ?Msk:  Strength and tone appear normal for age. ?Extremities: No clubbing or cyanosis. edema.  Distal pedal pulses are 2+ and equal bilaterally. ?Skin: Warm and Dry ?Neuro: Alert and oriented X 3. CN III-XII intact Grossly normal sensory and motor function . ?Psych:  Responds to questions appropriately with a normal affect. ?  ?  ?  ? ?EKG:  ?11/07/2021 sinus at 62 ?Intervals 19/10/42 ?RSR prime S prime in lead V1 ?Intra-atrial conduction delay with a P wave duration of approximately 120-40 ms ? ? ? ?10/28/21 typical atrial flutter ventricular rate 89 ?1/23 atrial flutter-typical with 2: 1 conduction ventricular rate 136 ? ? ? ?Assessment and Plan:  ?Atrial flutter-recurrent-typical ? ?Morbid obesity  ? ?Sleep disordered breathing  ? ?Patient has had recurrent atrial flutter despite cardioversion.  We have discussed the physiology of atrial flutter, high likelihood of recurrence, thromboembolic risk  and because of symptomatic exercise intolerance.  We discussed the potential benefits of catheter ablation versus ongoing medical therapy including the possibility of atrial fibrillation following catheter ablation for atrial flutter.  We also discussed potential risks of catheter ablation ? ?He would like to discuss it with his wife but is inclined towards proceeding with catheter ablation. ? ?He also needs care for his sleep apnea. ? ?Discussed the importance of weight loss. ? ? ? ? ?Virl Axe  ?

## 2021-11-07 NOTE — Patient Instructions (Addendum)
Medication Instructions:  ?Your physician recommends that you continue on your current medications as directed. Please refer to the Current Medication list given to you today. ? ?*If you need a refill on your cardiac medications before your next appointment, please call your pharmacy* ? ? ?Lab Work: ?None ordered. ? ?If you have labs (blood work) drawn today and your tests are completely normal, you will receive your results only by: ?MyChart Message (if you have MyChart) OR ?A paper copy in the mail ?If you have any lab test that is abnormal or we need to change your treatment, we will call you to review the results. ? ? ?Testing/Procedures: ?Your physician has recommended that you have an ablation. Catheter ablation is a medical procedure used to treat some cardiac arrhythmias (irregular heartbeats). During catheter ablation, a long, thin, flexible tube is put into a blood vessel in your groin (upper thigh), or neck. This tube is called an ablation catheter. It is then guided to your heart through the blood vessel. Radio frequency waves destroy small areas of heart tissue where abnormal heartbeats may cause an arrhythmia to start. Please see the instruction sheet given to you today.  ? ? ?Follow-Up: ?At [facility], you and your health needs are our priority.  As part of our continuing mission to provide you with exceptional heart care, we have created designated Provider Care Teams.  These Care Teams include your primary Cardiologist (physician) and Advanced Practice Providers (APPs -  Physician Assistants and Nurse Practitioners) who all work together to provide you with the care you need, when you need it. ? ?We recommend signing up for the patient portal called "MyChart".  Sign up information is provided on this After Visit Summary.  MyChart is used to connect with patients for Virtual Visits (Telemedicine).  Patients are able to view lab/test results, encounter notes, upcoming appointments, etc.   Non-urgent messages can be sent to your provider as well.   ?To learn more about what you can do with MyChart, go to [email or web address].   ? ?Your next appointment:   ?To be scheduled - Please call or send a MyChart Message once you decide if you would like to proceed with Aflutter ablation. ? ?Pt needs appointment with Dr Mayford Knife following sleep study. ?

## 2021-11-11 DIAGNOSIS — N2889 Other specified disorders of kidney and ureter: Secondary | ICD-10-CM | POA: Diagnosis not present

## 2021-11-11 DIAGNOSIS — N1330 Unspecified hydronephrosis: Secondary | ICD-10-CM | POA: Diagnosis not present

## 2021-11-11 DIAGNOSIS — I714 Abdominal aortic aneurysm, without rupture, unspecified: Secondary | ICD-10-CM | POA: Diagnosis not present

## 2021-11-11 DIAGNOSIS — I700 Atherosclerosis of aorta: Secondary | ICD-10-CM | POA: Diagnosis not present

## 2021-11-11 DIAGNOSIS — N200 Calculus of kidney: Secondary | ICD-10-CM | POA: Diagnosis not present

## 2021-11-14 ENCOUNTER — Telehealth: Payer: Self-pay | Admitting: Internal Medicine

## 2021-11-14 NOTE — Telephone Encounter (Signed)
Pt this in via Mychart to the scheduling pool:   ? ? ?Hi, I wanted to let you know that I wish to proceed with the atrial flutter catheter ablation. Also , is this covered by Medicare? Do I need to call them? Let me know what I need to do to proceed. Thanks  ?

## 2021-11-15 NOTE — Telephone Encounter (Signed)
Spoke with pt who states he would like to schedule Aflutter Ablation for 01/13/2022.  Pt advised once procedure is scheduled RN will contact with instructions.  Pt verbalizes understanding and agrees with current plan. ?

## 2021-11-18 NOTE — Telephone Encounter (Signed)
Prior Authorization for TITRATION sent to [facility] via Phone. NO PA REQUIRED. ?

## 2021-11-21 NOTE — Telephone Encounter (Signed)
Patient is scheduled for CPAP Titration on 12/18/21. ?Patient understands his titration study will be done at [facility] sleep lab. ? ?

## 2021-11-22 NOTE — Telephone Encounter (Signed)
Attempted phone call to pt.  OK per Epic to leave voicemail message.  Advised to contact RN at [phone] due to needing to reschedule procedure date.   ?

## 2021-11-22 NOTE — Telephone Encounter (Signed)
Spoke with pt who is willing to move procedure dates from 01/13/2022 to 01/16/2022.  Pt advised once scheduled will contact pt to review instructions.  Pt verbalizes understanding and agrees with current plan. ?

## 2021-12-05 ENCOUNTER — Emergency Department (HOSPITAL_BASED_OUTPATIENT_CLINIC_OR_DEPARTMENT_OTHER): Payer: Medicare Other

## 2021-12-05 ENCOUNTER — Other Ambulatory Visit: Payer: Self-pay

## 2021-12-05 ENCOUNTER — Inpatient Hospital Stay (HOSPITAL_BASED_OUTPATIENT_CLINIC_OR_DEPARTMENT_OTHER)
Admission: EM | Admit: 2021-12-05 | Discharge: 2021-12-08 | DRG: 287 | Disposition: A | Discharge status: Home / Self Care | Payer: Medicare Other | Attending: Cardiology | Admitting: Cardiology

## 2021-12-05 ENCOUNTER — Encounter (HOSPITAL_BASED_OUTPATIENT_CLINIC_OR_DEPARTMENT_OTHER): Payer: Self-pay | Admitting: Emergency Medicine

## 2021-12-05 DIAGNOSIS — E785 Hyperlipidemia, unspecified: Secondary | ICD-10-CM | POA: Diagnosis present

## 2021-12-05 DIAGNOSIS — Z885 Allergy status to narcotic agent status: Secondary | ICD-10-CM | POA: Diagnosis not present

## 2021-12-05 DIAGNOSIS — I25110 Atherosclerotic heart disease of native coronary artery with unstable angina pectoris: Secondary | ICD-10-CM | POA: Diagnosis not present

## 2021-12-05 DIAGNOSIS — I200 Unstable angina: Secondary | ICD-10-CM | POA: Diagnosis not present

## 2021-12-05 DIAGNOSIS — Z87891 Personal history of nicotine dependence: Secondary | ICD-10-CM | POA: Diagnosis not present

## 2021-12-05 DIAGNOSIS — I4892 Unspecified atrial flutter: Secondary | ICD-10-CM | POA: Diagnosis not present

## 2021-12-05 DIAGNOSIS — Z7901 Long term (current) use of anticoagulants: Secondary | ICD-10-CM | POA: Diagnosis not present

## 2021-12-05 DIAGNOSIS — Z6832 Body mass index (BMI) 32.0-32.9, adult: Secondary | ICD-10-CM | POA: Diagnosis not present

## 2021-12-05 DIAGNOSIS — Z87442 Personal history of urinary calculi: Secondary | ICD-10-CM | POA: Diagnosis not present

## 2021-12-05 DIAGNOSIS — Z888 Allergy status to other drugs, medicaments and biological substances status: Secondary | ICD-10-CM

## 2021-12-05 DIAGNOSIS — Z91040 Latex allergy status: Secondary | ICD-10-CM | POA: Diagnosis not present

## 2021-12-05 DIAGNOSIS — R7303 Prediabetes: Secondary | ICD-10-CM | POA: Diagnosis not present

## 2021-12-05 DIAGNOSIS — Z79899 Other long term (current) drug therapy: Secondary | ICD-10-CM | POA: Diagnosis not present

## 2021-12-05 DIAGNOSIS — I2510 Atherosclerotic heart disease of native coronary artery without angina pectoris: Secondary | ICD-10-CM | POA: Diagnosis not present

## 2021-12-05 DIAGNOSIS — R0789 Other chest pain: Secondary | ICD-10-CM | POA: Diagnosis not present

## 2021-12-05 DIAGNOSIS — R079 Chest pain, unspecified: Secondary | ICD-10-CM | POA: Diagnosis not present

## 2021-12-05 DIAGNOSIS — I10 Essential (primary) hypertension: Secondary | ICD-10-CM | POA: Diagnosis present

## 2021-12-05 DIAGNOSIS — G4733 Obstructive sleep apnea (adult) (pediatric): Secondary | ICD-10-CM | POA: Diagnosis present

## 2021-12-05 DIAGNOSIS — E669 Obesity, unspecified: Secondary | ICD-10-CM | POA: Diagnosis present

## 2021-12-05 DIAGNOSIS — I208 Other forms of angina pectoris: Secondary | ICD-10-CM | POA: Diagnosis not present

## 2021-12-05 DIAGNOSIS — I4819 Other persistent atrial fibrillation: Secondary | ICD-10-CM | POA: Diagnosis present

## 2021-12-05 DIAGNOSIS — N400 Enlarged prostate without lower urinary tract symptoms: Secondary | ICD-10-CM | POA: Diagnosis not present

## 2021-12-05 LAB — CBC
HCT: 44.7 % (ref 39.0–52.0)
Hemoglobin: 15.2 g/dL (ref 13.0–17.0)
MCH: 30.1 pg (ref 26.0–34.0)
MCHC: 34 g/dL (ref 30.0–36.0)
MCV: 88.5 fL (ref 80.0–100.0)
Platelets: 256 10*3/uL (ref 150–400)
RBC: 5.05 MIL/uL (ref 4.22–5.81)
RDW: 13.6 % (ref 11.5–15.5)
WBC: 11.8 10*3/uL — ABNORMAL HIGH (ref 4.0–10.5)
nRBC: 0 % (ref 0.0–0.2)

## 2021-12-05 LAB — TROPONIN I (HIGH SENSITIVITY)
Troponin I (High Sensitivity): 3 ng/L (ref <18)
Troponin I (High Sensitivity): 3 ng/L (ref <18)

## 2021-12-05 LAB — BASIC METABOLIC PANEL
Anion gap: 10 (ref 5–15)
BUN: 18 mg/dL (ref 8–23)
CO2: 25 mmol/L (ref 22–32)
Calcium: 9 mg/dL (ref 8.9–10.3)
Chloride: 102 mmol/L (ref 98–111)
Creatinine, Ser: 0.98 mg/dL (ref 0.61–1.24)
GFR, Estimated: >60 mL/min (ref >60)
Glucose, Bld: 178 mg/dL — ABNORMAL HIGH (ref 70–99)
Potassium: 3.7 mmol/L (ref 3.5–5.1)
Sodium: 137 mmol/L (ref 135–145)

## 2021-12-05 MED ORDER — ACETAMINOPHEN 325 MG PO TABS: 650.0000 mg | ORAL_TABLET | ORAL | Status: DC | PRN

## 2021-12-05 MED ORDER — NITROGLYCERIN 0.4 MG SL SUBL: 0.4000 mg | SUBLINGUAL_TABLET | SUBLINGUAL | Status: DC | PRN

## 2021-12-05 MED ORDER — ASPIRIN EC 81 MG PO TBEC
81.0000 mg | DELAYED_RELEASE_TABLET | Freq: Every day | ORAL | Status: DC
  Filled 2021-12-05 (×2): qty 1

## 2021-12-05 MED ORDER — ONDANSETRON HCL 4 MG/2ML IJ SOLN: 4.0000 mg | Freq: Four times a day (QID) | INTRAMUSCULAR | Status: DC | PRN

## 2021-12-05 MED ORDER — OMEGA-3-ACID ETHYL ESTERS 1 G PO CAPS
1000.0000 mg | ORAL_CAPSULE | Freq: Every day | ORAL | Status: DC
  Administered 2021-12-06 – 2021-12-08 (×3): 1000 mg via ORAL
  Filled 2021-12-05 (×3): qty 1

## 2021-12-05 MED ORDER — DILTIAZEM HCL ER COATED BEADS 180 MG PO CP24
180.0000 mg | ORAL_CAPSULE | Freq: Every day | ORAL | Status: DC
  Administered 2021-12-06 – 2021-12-08 (×3): 180 mg via ORAL
  Filled 2021-12-05 (×3): qty 1

## 2021-12-05 MED ORDER — ROSUVASTATIN CALCIUM 20 MG PO TABS
20.0000 mg | ORAL_TABLET | Freq: Every day | ORAL | Status: DC
  Administered 2021-12-06 – 2021-12-08 (×3): 20 mg via ORAL
  Filled 2021-12-05 (×3): qty 1

## 2021-12-05 MED ORDER — CALCIUM POLYCARBOPHIL 625 MG PO TABS
1250.0000 mg | ORAL_TABLET | Freq: Every day | ORAL | Status: DC
  Administered 2021-12-06 – 2021-12-08 (×3): 1250 mg via ORAL
  Filled 2021-12-05 (×3): qty 2

## 2021-12-05 MED ORDER — ALPRAZOLAM 0.25 MG PO TABS: 0.2500 mg | ORAL_TABLET | Freq: Two times a day (BID) | ORAL | Status: DC | PRN

## 2021-12-05 MED ORDER — BISACODYL 5 MG PO TBEC
5.0000 mg | DELAYED_RELEASE_TABLET | Freq: Every day | ORAL | Status: DC
  Administered 2021-12-06: 5 mg via ORAL
  Filled 2021-12-05 (×2): qty 1

## 2021-12-05 MED ORDER — FERROUS SULFATE 325 (65 FE) MG PO TABS
325.0000 mg | ORAL_TABLET | ORAL | Status: DC
  Administered 2021-12-06 – 2021-12-08 (×2): 325 mg via ORAL
  Filled 2021-12-05 (×3): qty 1

## 2021-12-05 MED ORDER — ZOLPIDEM TARTRATE 5 MG PO TABS: 5.0000 mg | ORAL_TABLET | Freq: Every evening | ORAL | Status: DC | PRN

## 2021-12-05 MED ORDER — VITAMIN B-12 1000 MCG PO TABS
500.0000 ug | ORAL_TABLET | Freq: Every day | ORAL | Status: DC
  Administered 2021-12-06 – 2021-12-08 (×3): 500 ug via ORAL
  Filled 2021-12-05 (×3): qty 1

## 2021-12-05 MED ORDER — APIXABAN 5 MG PO TABS: 5.0000 mg | ORAL_TABLET | Freq: Two times a day (BID) | ORAL | Status: DC

## 2021-12-05 MED ORDER — INDAPAMIDE 2.5 MG PO TABS
2.5000 mg | ORAL_TABLET | Freq: Every day | ORAL | Status: DC
  Administered 2021-12-06 – 2021-12-08 (×3): 2.5 mg via ORAL
  Filled 2021-12-05 (×3): qty 1

## 2021-12-05 NOTE — ED Triage Notes (Signed)
Pt arrives pov, steady gait to triage, c/o non radiating  CP with "some" shob x 3 days intermittently. Denies n/v ?

## 2021-12-05 NOTE — ED Provider Notes (Signed)
?[facility] EMERGENCY DEPARTMENT ?Provider Note ? ? ?CSN: [id number] ?Arrival date & time: 12/05/21  1514 ? ?  ? ?History ? ?Chief Complaint  ?Patient presents with  ? Chest Pain  ? ? ?Richard Vazquez is a 66 y.o. male. ? ?HPI ? ?  ? ?66 year old male with a history of recurrent atrial flutter on Eliquis with plan for ablation in May, hyperlipidemia, carotid artery disease, who presents with concern for chest pain. ? ?Worse with exertion, shortness of breath, pounding in chest, can tell also that heart rate going up ?Chest pain feels like a gas pain, tightness, squeezing pain, stabbing too ?Coming and going over the last 3 days ?During the night would come on and then go away, would sit up and then would subside ?Has not had this type of discomfort before with atrial flutter ?Typically will have dyspnea and fatigue with flutter, but not CP like this ?Has a home monitor, HR up and down a lot ?When walking, will get worse, worsening shortness of breath, has gas pain, tightness ?At this moment not having discomfort. Had an episode in waiting room which was stabbing after he walked to the bathroom that lasted minutes and resolved while he was sitting. Has had intermittent stabbing type of pain over last few days.  ? ?Out of it Oct-Feb (or last cardioversion), 2-3 weeks later atrial flutter and has been in it since per pt and family. Did have ECG without it in March  ?No new leg swelling or pain, surgery, immobilization. Has been taking eliquis as prescribed.  ? ? ? ? ?Past Medical History:  ?Diagnosis Date  ? Carotid artery disease ([place])   ? Carotid US 12/22: R 40-59; L 1-39  ? Hyperlipemia   ? Kidney stones   ? Obesity   ? Paroxysmal atrial flutter ([place]) 05/29/2021  ?  ?Home Medications ?Prior to Admission medications   ?Medication Sig Start Date End Date Taking? Authorizing Provider  ?apixaban (ELIQUIS) 5 MG TABS tablet Take 1 tablet (5 mg total) by mouth 2 (two) times daily. 06/29/21   Imogene Burn, PA-C   ?aspirin-acetaminophen-caffeine (EXCEDRIN MIGRAINE) [phone] MG tablet Take 2 tablets by mouth every 6 (six) hours as needed for headache.    [provider]  ?bisacodyl (DULCOLAX) 5 MG EC tablet Take 5 mg by mouth daily as needed for moderate constipation.    [provider]  ?diltiazem (CARDIZEM CD) 180 MG 24 hr capsule Take 1 capsule (180 mg total) by mouth daily. 06/29/21   Imogene Burn, PA-C  ?indapamide (LOZOL) 2.5 MG tablet Take 2.5 mg by mouth daily. 10/21/21   [provider]  ?Iron, Ferrous Sulfate, 325 (65 Fe) MG TABS Take 325 mg by mouth every other day.    [provider]  ?metoprolol tartrate (LOPRESSOR) 25 MG tablet Take one tablet (25 mg) as needed for heart racing. 10/28/21   Fay Records, MD  ?Omega-3 1000 MG CAPS Take 1,000 mg by mouth daily.    [provider]  ?OVER THE COUNTER MEDICATION Take 2 capsules by mouth daily. Healthy Beet supplement    [provider]  ?polycarbophil (FIBERCON) 625 MG tablet Take 1,250 mg by mouth daily.    [provider]  ?rosuvastatin (CRESTOR) 20 MG tablet Take 1 tablet (20 mg total) by mouth daily. 08/08/21 11/06/21  Richardson Dopp T, PA-C  ?vitamin B-12 (CYANOCOBALAMIN) 500 MCG tablet Take 500 mcg by mouth daily.    [provider]  ?   ? ?  Allergies    ?Atorvastatin, Codeine, and Latex   ? ?Review of Systems   ?Review of Systems ?See above ?Physical Exam ?Updated Vital Signs ?BP 124/80   Pulse 76   Temp 98.1 ?F (36.7 ?C) (Oral)   Resp 16   Ht 6\' 4"  (1.93 m)   Wt 122.5 kg   SpO2 98%   BMI 32.87 kg/m?  ?Physical Exam ?Vitals and nursing note reviewed.  ?Constitutional:   ?   General: He is not in acute distress. ?   Appearance: He is well-developed. He is not diaphoretic.  ?HENT:  ?   Head: Normocephalic and atraumatic.  ?Eyes:  ?   Conjunctiva/sclera: Conjunctivae normal.  ?Cardiovascular:  ?   Rate and Rhythm: Normal rate and regular rhythm.  ?   Heart sounds: Normal heart sounds.  No murmur heard. ?  No friction rub. No gallop.  ?Pulmonary:  ?   Effort: Pulmonary effort is normal. No respiratory distress.  ?   Breath sounds: Normal breath sounds. No wheezing or rales.  ?Abdominal:  ?   General: There is no distension.  ?   Palpations: Abdomen is soft.  ?   Tenderness: There is no abdominal tenderness. There is no guarding.  ?Musculoskeletal:  ?   Cervical back: Normal range of motion.  ?Skin: ?   General: Skin is warm and dry.  ?Neurological:  ?   Mental Status: He is alert and oriented to person, place, and time.  ? ? ?ED Results / Procedures / Treatments   ?Labs ?(all labs ordered are listed, but only abnormal results are displayed) ?Labs Reviewed  ?BASIC METABOLIC PANEL - Abnormal; Notable for the following components:  ?    Result Value  ? Glucose, Bld 178 (*)   ? All other components within normal limits  ?CBC - Abnormal; Notable for the following components:  ? WBC 11.8 (*)   ? All other components within normal limits  ?TROPONIN I (HIGH SENSITIVITY)  ?TROPONIN I (HIGH SENSITIVITY)  ? ? ?EKG ?EKG Interpretation ? ?Date/Time:  Monday December 05 2021 15:28:01 EDT ?Ventricular Rate:  116 ?PR Interval:    ?QRS Duration: 84 ?QT Interval:  376 ?QTC Calculation: 522 ?R Axis:   85 ?Text Interpretation: Atrial flutter with variable A-V block Nonspecific ST abnormality Prolonged QT Abnormal ECG Since prior ECG, pt now in atrial flutter Confirmed by Gareth Morgan [phone]) on 12/05/2021 4:58:57 PM ? ?Radiology ?DG Chest 2 View ? ?Result Date: 12/05/2021 ?CLINICAL DATA:  Chest pain EXAM: CHEST - 2 VIEW COMPARISON:  Chest XR, 05/29/2021.  CT AP, 11/11/2021. FINDINGS: Cardiomediastinal silhouette is within normal limits. Lungs are well inflated. Trace linear opacity at the LEFT lung base no focal consolidation or mass. No pleural effusion or pneumothorax. No acute displaced fracture. IMPRESSION: No active cardiopulmonary disease. Electronically Signed   By: Michaelle Birks M.D.   On: 12/05/2021 15:40    ? ?Procedures ?Procedures  ? ? ?Medications Ordered in ED ?Medications  ?apixaban (ELIQUIS) tablet 5 mg (has no administration in time range)  ? ? ?ED Course/ Medical Decision Making/ A&P ?  ?                        ?Medical Decision Making ?Amount and/or Complexity of Data Reviewed ?Labs: ordered. ?Radiology: ordered. ? ?Risk ?Decision regarding hospitalization. ? ? ? ?66 year old male with a history of recurrent atrial flutter on Eliquis with plan for ablation in May, hyperlipidemia, carotid artery disease, hyperlipidemia, past  smoking (quit nearly 3 years ago) who presents with concern for chest pain. ? ?Differential diagnosis for chest pain includes pulmonary embolus, dissection, pneumothorax, pneumonia, ACS, myocarditis, pericarditis, GERD, symptomatic atrial flutter.    ? ?EKG was done and evaluate by me and showed no acute ST changes and no signs of pericarditis--does show atrial flutter.  ? ?Chest x-ray was done and evaluated by me and radiology and showed no sign of pneumonia, pulmonary edema or pneumothorax. Patient is low risk [place] and on eliquis and have low suspicion for PE.   Do not feel history or exam are consistent with aortic dissection.   ? ?Has had intermittent symptoms, worse with exertion, unclear if related to atrial flutter and possible rate changes with exertion. His rate at rest is controlled without medications given in the ED.  Troponin negative, no sign of acute MI, however given some typical exertional symptoms, atrial flutter, discussed with Dr. Harl Bowie. Will admit for further care.  ? ? ? ? ? ? ? ? ? ?Final Clinical Impression(s) / ED Diagnoses ?Final diagnoses:  ?Atrial flutter, unspecified type ([place])  ?Chest pain, unspecified type  ? ? ?Rx / DC Orders ?ED Discharge Orders   ? ? None  ? ?  ? ? ?  ?Gareth Morgan, MD ?12/06/21 1439 ? ?

## 2021-12-05 NOTE — H&P (Signed)
?Cardiology Admission History and Physical:  ? ?Patient ID: Richard Vazquez ?MRN: [id number]; DOB: 1956-03-28  ? ?Admission date: 12/05/2021 ? ?PCP:  Crista Elliot, PA-C ?  ?[facility] Providers ?Cardiologist:  Dorris Carnes, MD  ?Cardiology APP:  Liliane Shi, PA-C     ? ? ?Chief Complaint:  chest pain ? ?Patient Profile:  ? ?Richard Vazquez is a 66 y.o. male with a PMHx of recurrent AFL on eliquis, HTN, HLD, carotid artery disease, OSA and obesity who is being seen 12/05/2021 for the evaluation of chest pain. ? ?History of Present Illness:  ? ?Mr. Betker is pleasant and seen at bedside. Patient reports intermittent mid-chest sharp/tightness without radiation in the past 3 days. The pain usually lasts less than one minute. The pain is worsened with walking or physical activity, and gradually resolves when he sits down or take a break. The pain is not related to changes in position, cough or deep inspiration. He also reports associated symptoms including lightheadedness, near-syncope (one time), shortness of breath and heart palpitation. He says he has been monitoring his HR and BP at home which were 70-130bpm and approximately 130/70mmHg, respectively. He says he usually experiences shortness of breath and fatigue when he has recurrent AFL episodes, he has never had this kind of pain in the past. One time the pain was so severe that woke him up. Currently, he is chest pain, on room air, no acute distress. Denies fever, chills, dizziness, syncope, cough, nausea, vomiting, PND, early satiety, abdominal fullness, dysuria, diarrhea, pedal edema or any bleeding events. ? ?Patient is compliant with his medications. ? ?OSH ?K 3.7, Cr 0.98, WBC 11.8, troponin x 2 negative ?ECG AFL ? ?Past Medical History:  ?Diagnosis Date  ? Carotid artery disease ([place])   ? Carotid US 12/22: R 40-59; L 1-39  ? Hyperlipemia   ? Kidney stones   ? Obesity   ? Paroxysmal atrial flutter ([place]) 05/29/2021  ? ? ?Past Surgical History:  ?Procedure  Laterality Date  ? APPENDECTOMY    ? CARDIOVERSION N/A 05/31/2021  ? Procedure: CARDIOVERSION;  Surgeon: [name], MD;  Location: [facility];  Service: Cardiovascular;  Laterality: N/A;  ? CARDIOVERSION N/A 10/05/2021  ? Procedure: CARDIOVERSION;  Surgeon: [name], MD;  [facility] ENDOSCOPY;  Service: Cardiovascular;  Laterality: N/A;  ? TEE WITHOUT CARDIOVERSION N/A 05/31/2021  ? Procedure: TRANSESOPHAGEAL ECHOCARDIOGRAM (TEE);  Surgeon: [name], MD;  Location: [facility];  Service: Cardiovascular;  Laterality: N/A;  ?  ? ?Medications Prior to Admission: ?Prior to Admission medications   ?Medication Sig Start Date End Date Taking? Authorizing Provider  ?apixaban (ELIQUIS) 5 MG TABS tablet Take 1 tablet (5 mg total) by mouth 2 (two) times daily. 06/29/21   Imogene Burn, PA-C  ?aspirin-acetaminophen-caffeine (EXCEDRIN MIGRAINE) [phone] MG tablet Take 2 tablets by mouth every 6 (six) hours as needed for headache.    [provider]  ?bisacodyl (DULCOLAX) 5 MG EC tablet Take 5 mg by mouth daily as needed for moderate constipation.    [provider]  ?diltiazem (CARDIZEM CD) 180 MG 24 hr capsule Take 1 capsule (180 mg total) by mouth daily. 06/29/21   Imogene Burn, PA-C  ?indapamide (LOZOL) 2.5 MG tablet Take 2.5 mg by mouth daily. 10/21/21   [provider]  ?Iron, Ferrous Sulfate, 325 (65 Fe) MG TABS Take 325 mg by mouth every other day.    [provider]  ?metoprolol tartrate (LOPRESSOR) 25 MG tablet Take one  tablet (25 mg) as needed for heart racing. 10/28/21   Fay Records, MD  ?Omega-3 1000 MG CAPS Take 1,000 mg by mouth daily.    [provider]  ?OVER THE COUNTER MEDICATION Take 2 capsules by mouth daily. Healthy Beet supplement    [provider]  ?polycarbophil (FIBERCON) 625 MG tablet Take 1,250 mg by mouth daily.    [provider]  ?rosuvastatin (CRESTOR) 20 MG tablet Take 1 tablet (20 mg total) by mouth  daily. 08/08/21 11/06/21  Richardson Dopp T, PA-C  ?vitamin B-12 (CYANOCOBALAMIN) 500 MCG tablet Take 500 mcg by mouth daily.    [provider]  ?  ? ?Allergies:    ?Allergies  ?Allergen Reactions  ? Atorvastatin Other (See Comments)  ?  Myalgias  ? Codeine Itching  ? Latex Itching  ? ? ?Social History:   ?Social History  ? ?Socioeconomic History  ? Marital status: Married  ?  Spouse name: Not on file  ? Number of children: Not on file  ? Years of education: Not on file  ? Highest education level: Not on file  ?Occupational History  ? Not on file  ?Tobacco Use  ? Smoking status: Former  ?  Packs/day: 1.50  ?  Years: 40.00  ?  Pack years: 60.00  ?  Types: Cigarettes  ?  Quit date: 12/09/2019  ?  Years since quitting: 1.9  ?  Passive exposure: Past  ? Smokeless tobacco: Never  ?Vaping Use  ? Vaping Use: Never used  ?Substance and Sexual Activity  ? Alcohol use: Not Currently  ? Drug use: Never  ? Sexual activity: Not on file  ?Other Topics Concern  ? Not on file  ?Social History Narrative  ? Not on file  ? ?Social Determinants of Health  ? ?Financial Resource Strain: Not on file  ?Food Insecurity: Not on file  ?Transportation Needs: Not on file  ?Physical Activity: Not on file  ?Stress: Not on file  ?Social Connections: Not on file  ?Intimate Partner Violence: Not on file  ?  ?Family History:   ?The patient's family history is not on file.   ? ?ROS:  ?Please see the history of present illness.  ?All other ROS reviewed and negative.    ? ?Physical Exam/Data:  ? ?Vitals:  ? 12/05/21 1810 12/05/21 1815 12/05/21 2245 12/05/21 2253  ?BP: 124/80 124/80  137/78  ?Pulse:  76    ?Resp:  16  18  ?Temp:    97.9 ?F (36.6 ?C)  ?TempSrc:    Oral  ?SpO2:  98%  99%  ?Weight:   119.6 kg   ?Height:   6\' 3"  (1.905 m)   ? ?No intake or output data in the 24 hours ending 12/05/21 2343 ? ?  12/05/2021  ? 10:45 PM 12/05/2021  ?  3:22 PM 11/07/2021  ?  9:55 AM  ?Last 3 Weights  ?Weight (lbs) 263 lb 9.6 oz 270 lb 272 lb  ?Weight (kg)  119.568 kg 122.471 kg 123.378 kg  ?   ?Body mass index is 32.95 kg/m?.  ?General:  Well nourished, well developed, in no acute distress ?HEENT: normal ?Neck: no JVD ?Vascular: No carotid bruits; Distal pulses 2+ bilaterally   ?Cardiac:  no tenderness, irregular rhythm; RRR; no murmur  ?Lungs:  clear to auscultation bilaterally, no wheezing, rhonchi or rales  ?Abd: soft, nontender, no hepatomegaly  ?Ext: no edema ?Musculoskeletal:  No deformities, BUE and BLE strength normal and equal ?Skin: warm  and dry  ?Neuro:  CNs 2-12 intact, no focal abnormalities noted ?Psych:  Normal affect  ? ?Relevant CV Studies: ? ? ?Laboratory Data: ? ?High Sensitivity Troponin:   ?Recent Labs  ?Lab 12/05/21 ?1550 12/05/21 ?2029  ?TROPONINIHS 3 3  ?    ?Chemistry ?Recent Labs  ?Lab 12/05/21 ?1550  ?NA 137  ?K 3.7  ?CL 102  ?CO2 25  ?GLUCOSE 178*  ?BUN 18  ?CREATININE 0.98  ?CALCIUM 9.0  ?GFRNONAA >60  ?ANIONGAP 10  ?  ?No results for input(s): PROT, ALBUMIN, AST, ALT, ALKPHOS, BILITOT in the last 168 hours. ?Lipids No results for input(s): CHOL, TRIG, HDL, LABVLDL, LDLCALC, CHOLHDL in the last 168 hours. ?Hematology ?Recent Labs  ?Lab 12/05/21 ?1550  ?WBC 11.8*  ?RBC 5.05  ?HGB 15.2  ?HCT 44.7  ?MCV 88.5  ?MCH 30.1  ?MCHC 34.0  ?RDW 13.6  ?PLT 256  ? ?Thyroid No results for input(s): TSH, FREET4 in the last 168 hours. ?BNPNo results for input(s): BNP, PROBNP in the last 168 hours.  ?DDimer No results for input(s): DDIMER in the last 168 hours. ? ? ?Radiology/Studies:  ?DG Chest 2 View ? ?Result Date: 12/05/2021 ?CLINICAL DATA:  Chest pain EXAM: CHEST - 2 VIEW COMPARISON:  Chest XR, 05/29/2021.  CT AP, 11/11/2021. FINDINGS: Cardiomediastinal silhouette is within normal limits. Lungs are well inflated. Trace linear opacity at the LEFT lung base no focal consolidation or mass. No pleural effusion or pneumothorax. No acute displaced fracture. IMPRESSION: No active cardiopulmonary disease. Electronically Signed   By: Michaelle Birks M.D.   On:  12/05/2021 15:40   ? ? ?Assessment and Plan:  ?#Unstable angina ?-angina symptoms reproducible, troponin x 2 negative ?-continue Telemetry ?-continue ASA and crestor ?-risk stratification with A1C, TSH and lipid panel

## 2021-12-05 NOTE — ED Notes (Signed)
ED Provider at bedside. 

## 2021-12-06 ENCOUNTER — Inpatient Hospital Stay (HOSPITAL_COMMUNITY): Payer: Medicare Other

## 2021-12-06 ENCOUNTER — Other Ambulatory Visit (HOSPITAL_COMMUNITY): Payer: Self-pay

## 2021-12-06 DIAGNOSIS — I4819 Other persistent atrial fibrillation: Secondary | ICD-10-CM

## 2021-12-06 DIAGNOSIS — I200 Unstable angina: Secondary | ICD-10-CM

## 2021-12-06 LAB — LIPID PANEL
Cholesterol: 111 mg/dL (ref 0–200)
HDL: 34 mg/dL — ABNORMAL LOW (ref >40)
LDL Cholesterol: 40 mg/dL (ref 0–99)
Total CHOL/HDL Ratio: 3.3 RATIO
Triglycerides: 187 mg/dL — ABNORMAL HIGH (ref <150)
VLDL: 37 mg/dL (ref 0–40)

## 2021-12-06 LAB — HEMOGLOBIN A1C
Hgb A1c MFr Bld: 6 % — ABNORMAL HIGH (ref 4.8–5.6)
Mean Plasma Glucose: 125.5 mg/dL

## 2021-12-06 LAB — ECHOCARDIOGRAM COMPLETE
AR max vel: 3.19 cm2
AV Area VTI: 2.95 cm2
AV Area mean vel: 3.3 cm2
AV Mean grad: 4 mmHg
AV Peak grad: 7.5 mmHg
Ao pk vel: 1.37 m/s
Area-P 1/2: 5.46 cm2
Height: 75 in
S' Lateral: 2.6 cm
Weight: 4217.6 oz

## 2021-12-06 LAB — MAGNESIUM: Magnesium: 1.9 mg/dL (ref 1.7–2.4)

## 2021-12-06 LAB — TSH: TSH: 3.997 u[IU]/mL (ref 0.350–4.500)

## 2021-12-06 MED ORDER — ASPIRIN 81 MG PO CHEW
81.0000 mg | CHEWABLE_TABLET | ORAL | Status: AC
  Administered 2021-12-07: 81 mg via ORAL
  Filled 2021-12-06: qty 1

## 2021-12-06 MED ORDER — METOPROLOL TARTRATE 25 MG PO TABS
25.0000 mg | ORAL_TABLET | Freq: Two times a day (BID) | ORAL | Status: DC
  Administered 2021-12-06 – 2021-12-08 (×5): 25 mg via ORAL
  Filled 2021-12-06 (×5): qty 1

## 2021-12-06 MED ORDER — SODIUM CHLORIDE 0.9 % IV SOLN: 250.0000 mL | INTRAVENOUS | Status: DC | PRN

## 2021-12-06 MED ORDER — SODIUM CHLORIDE 0.9 % WEIGHT BASED INFUSION
1.0000 mL/kg/h | INTRAVENOUS | Status: DC
  Administered 2021-12-07: 1 mL/kg/h via INTRAVENOUS

## 2021-12-06 MED ORDER — SODIUM CHLORIDE 0.9 % WEIGHT BASED INFUSION
3.0000 mL/kg/h | INTRAVENOUS | Status: DC
  Administered 2021-12-07: 3 mL/kg/h via INTRAVENOUS

## 2021-12-06 MED ORDER — SODIUM CHLORIDE 0.9% FLUSH
3.0000 mL | Freq: Two times a day (BID) | INTRAVENOUS | Status: DC
  Administered 2021-12-06 – 2021-12-08 (×3): 3 mL via INTRAVENOUS

## 2021-12-06 MED ORDER — SODIUM CHLORIDE 0.9% FLUSH: 3.0000 mL | INTRAVENOUS | Status: DC | PRN

## 2021-12-06 NOTE — Progress Notes (Addendum)
? ?Progress Note ? ?Patient Name: Richard Vazquez ?Date of Encounter: 12/06/2021 ? ?[facility] Cardiologist: Dorris Carnes, MD  ? ?Subjective  ? ?Patient states he had intermittent chest pain and tightness, last episode occurred this morning while laying in bed. He felt SOB is mildly worsening, left leg is swelling from time to time. He suppose to wear CPAP for sleeping at night.  ? ?Inpatient Medications  ?  ?Scheduled Meds: ? aspirin EC  81 mg Oral Daily  ? bisacodyl  5 mg Oral QHS  ? diltiazem  180 mg Oral Daily  ? ferrous sulfate  325 mg Oral QODAY  ? indapamide  2.5 mg Oral Daily  ? omega-3 acid ethyl esters  1,000 mg Oral Daily  ? polycarbophil  1,250 mg Oral Daily  ? rosuvastatin  20 mg Oral Daily  ? vitamin B-12  500 mcg Oral Daily  ? ?Continuous Infusions: ? ?PRN Meds: ?acetaminophen, ALPRAZolam, nitroGLYCERIN, ondansetron (ZOFRAN) IV, zolpidem  ? ?Vital Signs  ?  ?Vitals:  ? 12/05/21 2245 12/05/21 2253 12/06/21 0622 12/06/21 0737  ?BP:  137/78 (!) 108/56 113/78  ?Pulse:      ?Resp:  18  17  ?Temp:  97.9 ?F (36.6 ?C) 98.7 ?F (37.1 ?C) 98.7 ?F (37.1 ?C)  ?TempSrc:  Oral Oral Oral  ?SpO2:  99% 95% 96%  ?Weight: 119.6 kg     ?Height: 6\' 3"  (1.905 m)     ? ? ?Intake/Output Summary (Last 24 hours) at 12/06/2021 0840 ?Last data filed at 12/06/2021 [id number] ?Gross per 24 hour  ?Intake 360 ml  ?Output --  ?Net 360 ml  ? ? ?  12/05/2021  ? 10:45 PM 12/05/2021  ?  3:22 PM 11/07/2021  ?  9:55 AM  ?Last 3 Weights  ?Weight (lbs) 263 lb 9.6 oz 270 lb 272 lb  ?Weight (kg) 119.568 kg 122.471 kg 123.378 kg  ?   ? ?Telemetry  ?  ?Atrial flutter 80-120s, artifacts  - Personally Reviewed ? ?ECG  ?  ?No new tracing today - Personally Reviewed ? ?Physical Exam  ? ?GEN: No acute distress.   ?Neck: No JVD ?Cardiac: Irregularly irregular, no murmurs, rubs, or gallops.  ?Respiratory: Clear to auscultation bilaterally. On room air.  ?GI: Soft, nontender, non-distended  ?MS: Mild non-pitting bilateral ankle edema; No deformity. ?Neuro:  Nonfocal   ?Psych: Normal affect  ? ?Labs  ?  ?High Sensitivity Troponin:   ?Recent Labs  ?Lab 12/05/21 ?1550 12/05/21 ?2029  ?TROPONINIHS 3 3  ?   ?Chemistry ?Recent Labs  ?Lab 12/05/21 ?1550 12/06/21 ?0230  ?NA 137  --   ?K 3.7  --   ?CL 102  --   ?CO2 25  --   ?GLUCOSE 178*  --   ?BUN 18  --   ?CREATININE 0.98  --   ?CALCIUM 9.0  --   ?MG  --  1.9  ?GFRNONAA >60  --   ?ANIONGAP 10  --   ?  ?Lipids  ?Recent Labs  ?Lab 12/06/21 ?0230  ?CHOL 111  ?TRIG 187*  ?HDL 34*  ?[place] 40  ?CHOLHDL 3.3  ?  ?Hematology ?Recent Labs  ?Lab 12/05/21 ?1550  ?WBC 11.8*  ?RBC 5.05  ?HGB 15.2  ?HCT 44.7  ?MCV 88.5  ?MCH 30.1  ?MCHC 34.0  ?RDW 13.6  ?PLT 256  ? ?Thyroid  ?Recent Labs  ?Lab 12/06/21 ?0230  ?TSH 3.997  ?  ?BNPNo results for input(s): BNP, PROBNP in the last 168 hours.  ?DDimer No  results for input(s): DDIMER in the last 168 hours.  ? ?Radiology  ?  ?DG Chest 2 View ? ?Result Date: 12/05/2021 ?CLINICAL DATA:  Chest pain EXAM: CHEST - 2 VIEW COMPARISON:  Chest XR, 05/29/2021.  CT AP, 11/11/2021. FINDINGS: Cardiomediastinal silhouette is within normal limits. Lungs are well inflated. Trace linear opacity at the LEFT lung base no focal consolidation or mass. No pleural effusion or pneumothorax. No acute displaced fracture. IMPRESSION: No active cardiopulmonary disease. Electronically Signed   By: Michaelle Birks M.D.   On: 12/05/2021 15:40   ? ?Cardiac Studies  ? ?Echo from 05/31/2021: ? ? 1. Left ventricular ejection fraction, by estimation, is 65 to 70%. The  ?left ventricle has normal function. The left ventricle has no regional  ?wall motion abnormalities. There is mild left ventricular hypertrophy.  ? 2. Right ventricular systolic function is normal. The right ventricular  ?size is normal.  ? 3. No left atrial/left atrial appendage thrombus was detected.  ? 4. The mitral valve is grossly normal. Trivial mitral valve  ?regurgitation.  ? 5. The aortic valve is tricuspid. Aortic valve regurgitation is not  ?visualized.   ? ?Conclusion(s)/Recommendation(s): No LA/LAA thrombus identified. Successful  ?cardioversion performed with restoration of normal sinus rhythm.  ? ? ? ?Patient Profile  ?   ?66 y.o. male with PMH of permanent atrial flutter s/p cardioversion with recurrence, carotid artery disease, HTN, HLD, obesity, OSA, pre-diabetes, who presented here on 12/05/21 for intermittent chest pain worsening with exertion over the past 3 days, with lightheadedness, near-syncope, shortness of breath and heart palpitation.  ? ?Assessment & Plan  ?  ?Chest pain ?- presented with new onset intermittent chest pain worsening with exertion over the past 3 days, with lightheadedness, near-syncope, shortness of breath and heart palpitation ?- Hs trop negative x2 ?- EKG with atrial flutter  ?- TTE is pending today ?- LDL 40, A1C 6%, and TSH 3.9 ?- will determine further pending TTE, hold Eliquis this morning in case cardiac cath planned, last dose 4/10 AM ? ?Permanent  atrial flutter  ?- initially diagnosed on 05/2021, underwent cardioversion x2, but later had recurrence, ablation is planned by Dr Caryl Comes in May 2023  ?- historically on Eliquis for anticoagulation for CHADS2VASc 2, held this AM in case cath ?- rate controlled on cardizem  ? ?HLD ?- LDL 40, continue crestor  ? ?OSA ?- CPAP encouraged at night  ? ?Pre-diabetes ?- lifestyle modification discussed , not on meds  ? ? ? ?For questions or updates, please contact [facility] ?Please consult [email or web address] for contact info under  ? ?  ?   ?Signed, ?Margie Billet, NP  ?12/06/2021, 8:40 AM    ? ?Patient seen and examined   I agree with findings as noted by Earlie Raveling above  ?Pt familiar to me from clinic   Hx of atrial flutter with plans for ablation in May   He has had some SOB and pressure with atiral flutter   BUT he says his currnet symptoms are different    CP woke him up   FLeeting   INtermitt pains    ? ?ON exam, pt currently comfortable ?Pt tachy  100s  ?NEck is full  ?LUngs are CTA   ?Cardiac Irreg irreg   NO S3  ?Abd is supple  ?Ext are without edema ? ? ?Pt iwht known CV dz    ? If current symptoms related to CAD    ?Discussedwith patient   He says again, something  differnet  "Felt like I was having a heart attack" ?WOuld recomm L heart cath to define anatomy    ? ?His last ELiquis dose was 2:30 PM yesterday   WIll review with cath lab   ? Late today vs tomorrow ? ?Work with meds to improve HR  ? ?Dorris Carnes MD  ?

## 2021-12-06 NOTE — TOC Progression Note (Signed)
Transition of Care (TOC) - Progression Note  ? ? ?Patient Details  ?Name: Richard Vazquez ?MRN: [id number] ?Date of Birth: 1955/12/06 ? ?Transition of Care (TOC) CM/SW Contact  ?Leone Haven, RN ?Phone Number: ?12/06/2021, 10:32 AM ? ?Clinical Narrative:    ?From home, presents with chest pain, for cath today or tomorrow.  He states he does have a PCP but would like to have a [facility] PCP. NCM scheduled him a follow up for new patient apt and hospital follow up with Dr. Fanny Bien 4/19 at 1 pm , on [facility].   ? ? ?  ?  ? ?Expected Discharge Plan and Services ?  ?  ?  ?  ?  ?                ?  ?  ?  ?  ?  ?  ?  ?  ?  ?  ? ? ?Social Determinants of Health (SDOH) Interventions ?  ? ?Readmission Risk Interventions ?   ? View : No data to display.  ?  ?  ?  ? ? ?

## 2021-12-06 NOTE — Progress Notes (Signed)
Echocardiogram ?2D Echocardiogram has been performed. ? ?[name] ?12/06/2021, 1:27 PM ?

## 2021-12-06 NOTE — Plan of Care (Signed)
  Problem: Activity: Goal: Ability to tolerate increased activity will improve Outcome: Progressing   

## 2021-12-06 NOTE — TOC Benefit Eligibility Note (Signed)
Patient Advocate Encounter  Insurance verification completed.    The patient is currently admitted and upon discharge could be taking Brilinta 90 mg.  The current 30 day co-pay is, $37.00.   The patient is insured through [employer] Part D     Dicie Edelen, CPhT Pharmacy Patient Advocate Specialist [facility] Patient Advocate Team Direct Number: [phone]  Fax: [phone]        

## 2021-12-07 ENCOUNTER — Encounter (HOSPITAL_COMMUNITY)
Admission: EM | Disposition: A | Discharge status: Home / Self Care | Payer: Self-pay | Source: Home / Self Care | Attending: Cardiology

## 2021-12-07 DIAGNOSIS — I4892 Unspecified atrial flutter: Secondary | ICD-10-CM

## 2021-12-07 DIAGNOSIS — I208 Other forms of angina pectoris: Secondary | ICD-10-CM

## 2021-12-07 HISTORY — PX: LEFT HEART CATH AND CORONARY ANGIOGRAPHY: CATH118249

## 2021-12-07 LAB — BASIC METABOLIC PANEL
Anion gap: 7 (ref 5–15)
BUN: 16 mg/dL (ref 8–23)
CO2: 26 mmol/L (ref 22–32)
Calcium: 8.9 mg/dL (ref 8.9–10.3)
Chloride: 104 mmol/L (ref 98–111)
Creatinine, Ser: 1.05 mg/dL (ref 0.61–1.24)
GFR, Estimated: >60 mL/min (ref >60)
Glucose, Bld: 96 mg/dL (ref 70–99)
Potassium: 3.8 mmol/L (ref 3.5–5.1)
Sodium: 137 mmol/L (ref 135–145)

## 2021-12-07 LAB — CBC
HCT: 43 % (ref 39.0–52.0)
Hemoglobin: 14.7 g/dL (ref 13.0–17.0)
MCH: 30 pg (ref 26.0–34.0)
MCHC: 34.2 g/dL (ref 30.0–36.0)
MCV: 87.8 fL (ref 80.0–100.0)
Platelets: 243 10*3/uL (ref 150–400)
RBC: 4.9 MIL/uL (ref 4.22–5.81)
RDW: 13.5 % (ref 11.5–15.5)
WBC: 10.4 10*3/uL (ref 4.0–10.5)
nRBC: 0 % (ref 0.0–0.2)

## 2021-12-07 SURGERY — LEFT HEART CATH AND CORONARY ANGIOGRAPHY
Anesthesia: LOCAL

## 2021-12-07 MED ORDER — MIDAZOLAM HCL 2 MG/2ML IJ SOLN
INTRAMUSCULAR | Status: DC | PRN
  Administered 2021-12-07: 1 mg via INTRAVENOUS

## 2021-12-07 MED ORDER — ACETAMINOPHEN 325 MG PO TABS: 650.0000 mg | ORAL_TABLET | ORAL | Status: DC | PRN

## 2021-12-07 MED ORDER — SODIUM CHLORIDE 0.9% FLUSH
3.0000 mL | Freq: Two times a day (BID) | INTRAVENOUS | Status: DC
  Administered 2021-12-07 – 2021-12-08 (×2): 3 mL via INTRAVENOUS

## 2021-12-07 MED ORDER — LIDOCAINE HCL (PF) 1 % IJ SOLN
INTRAMUSCULAR | Status: AC
  Filled 2021-12-07: qty 30

## 2021-12-07 MED ORDER — HEPARIN (PORCINE) IN NACL 1000-0.9 UT/500ML-% IV SOLN
INTRAVENOUS | Status: DC | PRN
  Administered 2021-12-07 (×3): 500 mL

## 2021-12-07 MED ORDER — HEPARIN SODIUM (PORCINE) 1000 UNIT/ML IJ SOLN
INTRAMUSCULAR | Status: DC | PRN
  Administered 2021-12-07: 5000 [IU] via INTRAVENOUS

## 2021-12-07 MED ORDER — MIDAZOLAM HCL 2 MG/2ML IJ SOLN
INTRAMUSCULAR | Status: AC
  Filled 2021-12-07: qty 2

## 2021-12-07 MED ORDER — SODIUM CHLORIDE 0.9% FLUSH: 3.0000 mL | INTRAVENOUS | Status: DC | PRN

## 2021-12-07 MED ORDER — LIDOCAINE HCL (PF) 1 % IJ SOLN
INTRAMUSCULAR | Status: DC | PRN
Start: 2021-12-07 — End: 2021-12-07
  Administered 2021-12-07: 2 mL

## 2021-12-07 MED ORDER — HEPARIN (PORCINE) IN NACL 1000-0.9 UT/500ML-% IV SOLN
INTRAVENOUS | Status: AC
  Filled 2021-12-07: qty 1000

## 2021-12-07 MED ORDER — IOHEXOL 350 MG/ML SOLN
INTRAVENOUS | Status: DC | PRN
  Administered 2021-12-07: 30 mL

## 2021-12-07 MED ORDER — SODIUM CHLORIDE 0.9 % IV SOLN: 250.0000 mL | INTRAVENOUS | Status: DC | PRN

## 2021-12-07 MED ORDER — HYDRALAZINE HCL 20 MG/ML IJ SOLN: 10.0000 mg | INTRAMUSCULAR | Status: AC | PRN

## 2021-12-07 MED ORDER — HEPARIN SODIUM (PORCINE) 1000 UNIT/ML IJ SOLN
INTRAMUSCULAR | Status: AC
Start: 2021-12-07 — End: ?
  Filled 2021-12-07: qty 10

## 2021-12-07 MED ORDER — VERAPAMIL HCL 2.5 MG/ML IV SOLN
INTRAVENOUS | Status: DC | PRN
  Administered 2021-12-07: 10 mL via INTRA_ARTERIAL

## 2021-12-07 MED ORDER — SODIUM CHLORIDE 0.9 % IV SOLN: INTRAVENOUS | Status: AC

## 2021-12-07 MED ORDER — VERAPAMIL HCL 2.5 MG/ML IV SOLN
INTRAVENOUS | Status: AC
  Filled 2021-12-07: qty 2

## 2021-12-07 MED ORDER — LABETALOL HCL 5 MG/ML IV SOLN: 10.0000 mg | INTRAVENOUS | Status: AC | PRN

## 2021-12-07 MED ORDER — FENTANYL CITRATE (PF) 100 MCG/2ML IJ SOLN
INTRAMUSCULAR | Status: AC
Start: 2021-12-07 — End: ?
  Filled 2021-12-07: qty 2

## 2021-12-07 MED ORDER — ONDANSETRON HCL 4 MG/2ML IJ SOLN: 4.0000 mg | Freq: Four times a day (QID) | INTRAMUSCULAR | Status: DC | PRN

## 2021-12-07 MED ORDER — FENTANYL CITRATE (PF) 100 MCG/2ML IJ SOLN
INTRAMUSCULAR | Status: DC | PRN
  Administered 2021-12-07: 25 ug via INTRAVENOUS

## 2021-12-07 SURGICAL SUPPLY — 11 items
CATH DIAG 6FR JR4 (CATHETERS) ×1 IMPLANT
CATH INFINITI 5 FR JL3.5 (CATHETERS) ×1 IMPLANT
DEVICE RAD COMP TR BAND LRG (VASCULAR PRODUCTS) ×1 IMPLANT
GLIDESHEATH SLEND SS 6F .021 (SHEATH) ×1 IMPLANT
GUIDEWIRE INQWIRE 1.5J.035X260 (WIRE) IMPLANT
INQWIRE 1.5J .035X260CM (WIRE) ×2
KIT HEART LEFT (KITS) ×2 IMPLANT
PACK CARDIAC CATHETERIZATION (CUSTOM PROCEDURE TRAY) ×2 IMPLANT
SYR MEDRAD MARK 7 150ML (SYRINGE) ×2 IMPLANT
TRANSDUCER W/STOPCOCK (MISCELLANEOUS) ×2 IMPLANT
TUBING CIL FLEX 10 FLL-RA (TUBING) ×2 IMPLANT

## 2021-12-07 NOTE — Plan of Care (Signed)
  Problem: Activity: Goal: Ability to tolerate increased activity will improve Outcome: Progressing   

## 2021-12-07 NOTE — Plan of Care (Signed)
?  Problem: Activity: ?Goal: Ability to tolerate increased activity will improve ?Outcome: Progressing ?  ?Problem: Cardiac: ?Goal: Ability to achieve and maintain adequate cardiovascular perfusion will improve ?Outcome: Progressing ?  ?Problem: Activity: ?Goal: Ability to return to baseline activity level will improve ?Outcome: Progressing ?  ?Problem: Cardiovascular: ?Goal: Ability to achieve and maintain adequate cardiovascular perfusion will improve ?Outcome: Progressing ?  ?

## 2021-12-07 NOTE — Interval H&P Note (Signed)
History and Physical Interval Note: ? ?12/07/2021 ?4:16 PM ? ?Richard Vazquez  has presented today for surgery, with the diagnosis of nstemi.  The various methods of treatment have been discussed with the patient and family. After consideration of risks, benefits and other options for treatment, the patient has consented to  Procedure(s): ?LEFT HEART CATH AND CORONARY ANGIOGRAPHY (N/A) as a surgical intervention.  The patient's history has been reviewed, patient examined, no change in status, stable for surgery.  I have reviewed the patient's chart and labs.  Questions were answered to the patient's satisfaction.   ? ?Cath Lab Visit (complete for each Cath Lab visit) ? ?Clinical Evaluation Leading to the Procedure:  ? ?ACS: No. ? ?Non-ACS:   ? ?Anginal Classification: CCS I ? ?Anti-ischemic medical therapy: Minimal Therapy (1 class of medications) ? ?Non-Invasive Test Results: No non-invasive testing performed ? ?Prior CABG: No previous CABG ? ? ? ? ? ? ? ?Richard Vazquez ? ? ?

## 2021-12-07 NOTE — Progress Notes (Addendum)
? ?Progress Note ? ?Patient Name: Richard Vazquez ?Date of Encounter: 12/07/2021 ? ?[facility] Cardiologist: Dorris Carnes, MD  ? ?Subjective  ? ?Patient states he is feeling better, no further chest pain. He denied any SOB. He asked questions about cardiac cath today. He states he could not sleep well here.  ? ?Inpatient Medications  ?  ?Scheduled Meds: ? aspirin EC  81 mg Oral Daily  ? bisacodyl  5 mg Oral QHS  ? diltiazem  180 mg Oral Daily  ? ferrous sulfate  325 mg Oral QODAY  ? indapamide  2.5 mg Oral Daily  ? metoprolol tartrate  25 mg Oral BID  ? omega-3 acid ethyl esters  1,000 mg Oral Daily  ? polycarbophil  1,250 mg Oral Daily  ? rosuvastatin  20 mg Oral Daily  ? sodium chloride flush  3 mL Intravenous Q12H  ? vitamin B-12  500 mcg Oral Daily  ? ?Continuous Infusions: ? sodium chloride    ? sodium chloride 1 mL/kg/hr (12/07/21 0540)  ? ?PRN Meds: ?sodium chloride, acetaminophen, ALPRAZolam, nitroGLYCERIN, ondansetron (ZOFRAN) IV, sodium chloride flush, zolpidem  ? ?Vital Signs  ?  ?Vitals:  ? 12/06/21 1417 12/06/21 1959 12/06/21 2226 12/07/21 0500  ?BP: 109/74 106/75 106/68 110/60  ?Pulse:  83 85 88  ?Resp: 16 16  17   ?Temp:  98.2 ?F (36.8 ?C)  98.7 ?F (37.1 ?C)  ?TempSrc:  Oral  Oral  ?SpO2: 94% 94%  96%  ?Weight:      ?Height:      ? ? ?Intake/Output Summary (Last 24 hours) at 12/07/2021 0824 ?Last data filed at 12/07/2021 0600 ?Gross per 24 hour  ?Intake 1219.87 ml  ?Output --  ?Net 1219.87 ml  ? ? ?  12/05/2021  ? 10:45 PM 12/05/2021  ?  3:22 PM 11/07/2021  ?  9:55 AM  ?Last 3 Weights  ?Weight (lbs) 263 lb 9.6 oz 270 lb 272 lb  ?Weight (kg) 119.568 kg 122.471 kg 123.378 kg  ?   ? ?Telemetry  ?  ?Atrial flutter 80-90s - Personally Reviewed ? ?ECG  ? ?EKG today showed Atrial flutter with variable AVB, ventricular rate of 86 bpm  - Personally Reviewed ? ?Physical Exam  ? ?GEN: No acute distress.   ?Neck: No JVD ?Cardiac: Irregularly irregular, no murmurs, rubs, or gallops.  ?Respiratory: Clear to auscultation  bilaterally. On room air. On 2LNC pox 97%  ?GI: Soft, nontender, non-distended  ?MS: Mild non-pitting bilateral ankle edema; No deformity. ?Neuro:  Nonfocal  ?Psych: Normal affect  ? ?Labs  ?  ?High Sensitivity Troponin:   ?Recent Labs  ?Lab 12/05/21 ?1550 12/05/21 ?2029  ?TROPONINIHS 3 3  ?   ?Chemistry ?Recent Labs  ?Lab 12/05/21 ?1550 12/06/21 ?0230 12/07/21 ?0322  ?NA 137  --  137  ?K 3.7  --  3.8  ?CL 102  --  104  ?CO2 25  --  26  ?GLUCOSE 178*  --  96  ?BUN 18  --  16  ?CREATININE 0.98  --  1.05  ?CALCIUM 9.0  --  8.9  ?MG  --  1.9  --   ?GFRNONAA >60  --  >60  ?ANIONGAP 10  --  7  ?  ?Lipids  ?Recent Labs  ?Lab 12/06/21 ?0230  ?CHOL 111  ?TRIG 187*  ?HDL 34*  ?[place] 40  ?CHOLHDL 3.3  ?  ?Hematology ?Recent Labs  ?Lab 12/05/21 ?1550 12/07/21 ?0322  ?WBC 11.8* 10.4  ?RBC 5.05 4.90  ?HGB 15.2  14.7  ?HCT 44.7 43.0  ?MCV 88.5 87.8  ?MCH 30.1 30.0  ?MCHC 34.0 34.2  ?RDW 13.6 13.5  ?PLT 256 243  ? ?Thyroid  ?Recent Labs  ?Lab 12/06/21 ?0230  ?TSH 3.997  ?  ?BNPNo results for input(s): BNP, PROBNP in the last 168 hours.  ?DDimer No results for input(s): DDIMER in the last 168 hours.  ? ?Radiology  ?  ?DG Chest 2 View ? ?Result Date: 12/05/2021 ?CLINICAL DATA:  Chest pain EXAM: CHEST - 2 VIEW COMPARISON:  Chest XR, 05/29/2021.  CT AP, 11/11/2021. FINDINGS: Cardiomediastinal silhouette is within normal limits. Lungs are well inflated. Trace linear opacity at the LEFT lung base no focal consolidation or mass. No pleural effusion or pneumothorax. No acute displaced fracture. IMPRESSION: No active cardiopulmonary disease. Electronically Signed   By: Michaelle Birks M.D.   On: 12/05/2021 15:40  ? ?ECHOCARDIOGRAM COMPLETE ? ?Result Date: 12/06/2021 ?   ECHOCARDIOGRAM REPORT   Patient Name:   Richard Vazquez Date of Exam: 12/06/2021 Medical Rec #:  [id number]  Height:       75.0 in Accession #:    [id number] Weight:       263.6 lb Date of Birth:  03-19-1956  BSA:          2.468 m? Patient Age:    66 years   BP:           113/78 mmHg  Patient Gender: M          HR:           76 bpm. Exam Location:  Inpatient Procedure: 2D Echo Indications:    Atrial flutter  History:        Patient has prior history of Echocardiogram examinations, most                 recent 05/30/2021. Arrythmias:Atrial Fibrillation; Risk                 Factors:Dyslipidemia.  Sonographer:    [name] Referring Phys: [id number] FAN YE  Sonographer Comments: Image acquisition challenging due to patient body habitus. IMPRESSIONS  1. Left ventricular ejection fraction, by estimation, is 65 to 70%. The left ventricle has hyperdynamic function. The left ventricle has no regional wall motion abnormalities. There is mild concentric left ventricular hypertrophy. Left ventricular diastolic function could not be evaluated.  2. Right ventricular systolic function was not well visualized. The right ventricular size is normal. There is normal pulmonary artery systolic pressure.  3. The mitral valve is normal in structure. Mild mitral valve regurgitation. No evidence of mitral stenosis.  4. The aortic valve is normal in structure. Aortic valve regurgitation is not visualized. No aortic stenosis is present.  5. The inferior vena cava is dilated in size with >50% respiratory variability, suggesting right atrial pressure of 8 mmHg. FINDINGS  Left Ventricle: Left ventricular ejection fraction, by estimation, is 65 to 70%. The left ventricle has hyperdynamic function. The left ventricle has no regional wall motion abnormalities. The left ventricular internal cavity size was normal in size. There is mild concentric left ventricular hypertrophy. Left ventricular diastolic function could not be evaluated. Right Ventricle: The right ventricular size is normal. No increase in right ventricular wall thickness. Right ventricular systolic function was not well visualized. There is normal pulmonary artery systolic pressure. The tricuspid regurgitant velocity is  1.81 m/s, and with an assumed right atrial  pressure of 8 mmHg, the estimated right ventricular systolic pressure is 123456 mmHg. Left Atrium:  Left atrial size was normal in size. Right Atrium: Right atrial size was not assessed. Pericardium: There is no evidence of pericardial effusion. Presence of epicardial fat layer. Mitral Valve: The mitral valve is normal in structure. Mild mitral valve regurgitation. No evidence of mitral valve stenosis. Tricuspid Valve: The tricuspid valve is normal in structure. Tricuspid valve regurgitation is not demonstrated. No evidence of tricuspid stenosis. Aortic Valve: The aortic valve is normal in structure. Aortic valve regurgitation is not visualized. No aortic stenosis is present. Aortic valve mean gradient measures 4.0 mmHg. Aortic valve peak gradient measures 7.5 mmHg. Aortic valve area, by VTI measures 2.95 cm?. Pulmonic Valve: The pulmonic valve was not well visualized. Pulmonic valve regurgitation is not visualized. No evidence of pulmonic stenosis. Aorta: The aortic root is normal in size and structure. Venous: The inferior vena cava is dilated in size with greater than 50% respiratory variability, suggesting right atrial pressure of 8 mmHg. IAS/Shunts: No atrial level shunt detected by color flow Doppler.  LEFT VENTRICLE PLAX 2D LVIDd:         4.30 cm LVIDs:         2.60 cm LV PW:         1.30 cm LV IVS:        1.30 cm LVOT diam:     2.10 cm LV SV:         76 LV SV Index:   31 LVOT Area:     3.46 cm?  RIGHT VENTRICLE            IVC RV S prime:     9.90 cm/s  IVC diam: 2.20 cm TAPSE (M-mode): 1.7 cm LEFT ATRIUM             Index LA diam:        4.60 cm 1.86 cm/m? LA Vol (A2C):   71.1 ml 28.81 ml/m? LA Vol (A4C):   59.4 ml 24.07 ml/m? LA Biplane Vol: 66.5 ml 26.95 ml/m?  AORTIC VALVE                    PULMONIC VALVE AV Area (Vmax):    3.19 cm?     PV Vmax:       1.09 m/s AV Area (Vmean):   3.30 cm?     PV Peak grad:  4.8 mmHg AV Area (VTI):     2.95 cm? AV Vmax:           137.00 cm/s AV Vmean:          89.600 cm/s AV  VTI:            0.257 m AV Peak Grad:      7.5 mmHg AV Mean Grad:      4.0 mmHg LVOT Vmax:         126.00 cm/s LVOT Vmean:        85.300 cm/s LVOT VTI:          0.219 m LVOT/AV VTI ratio: 0.85  AORTA Ao Ro

## 2021-12-07 NOTE — H&P (View-Only) (Signed)
? ?Progress Note ? ?Patient Name: Richard Vazquez ?Date of Encounter: 12/07/2021 ? ?[facility] Cardiologist: Dorris Carnes, MD  ? ?Subjective  ? ?Patient states he is feeling better, no further chest pain. He denied any SOB. He asked questions about cardiac cath today. He states he could not sleep well here.  ? ?Inpatient Medications  ?  ?Scheduled Meds: ? aspirin EC  81 mg Oral Daily  ? bisacodyl  5 mg Oral QHS  ? diltiazem  180 mg Oral Daily  ? ferrous sulfate  325 mg Oral QODAY  ? indapamide  2.5 mg Oral Daily  ? metoprolol tartrate  25 mg Oral BID  ? omega-3 acid ethyl esters  1,000 mg Oral Daily  ? polycarbophil  1,250 mg Oral Daily  ? rosuvastatin  20 mg Oral Daily  ? sodium chloride flush  3 mL Intravenous Q12H  ? vitamin B-12  500 mcg Oral Daily  ? ?Continuous Infusions: ? sodium chloride    ? sodium chloride 1 mL/kg/hr (12/07/21 0540)  ? ?PRN Meds: ?sodium chloride, acetaminophen, ALPRAZolam, nitroGLYCERIN, ondansetron (ZOFRAN) IV, sodium chloride flush, zolpidem  ? ?Vital Signs  ?  ?Vitals:  ? 12/06/21 1417 12/06/21 1959 12/06/21 2226 12/07/21 0500  ?BP: 109/74 106/75 106/68 110/60  ?Pulse:  83 85 88  ?Resp: 16 16  17   ?Temp:  98.2 ?F (36.8 ?C)  98.7 ?F (37.1 ?C)  ?TempSrc:  Oral  Oral  ?SpO2: 94% 94%  96%  ?Weight:      ?Height:      ? ? ?Intake/Output Summary (Last 24 hours) at 12/07/2021 0824 ?Last data filed at 12/07/2021 0600 ?Gross per 24 hour  ?Intake 1219.87 ml  ?Output --  ?Net 1219.87 ml  ? ? ?  12/05/2021  ? 10:45 PM 12/05/2021  ?  3:22 PM 11/07/2021  ?  9:55 AM  ?Last 3 Weights  ?Weight (lbs) 263 lb 9.6 oz 270 lb 272 lb  ?Weight (kg) 119.568 kg 122.471 kg 123.378 kg  ?   ? ?Telemetry  ?  ?Atrial flutter 80-90s - Personally Reviewed ? ?ECG  ? ?EKG today showed Atrial flutter with variable AVB, ventricular rate of 86 bpm  - Personally Reviewed ? ?Physical Exam  ? ?GEN: No acute distress.   ?Neck: No JVD ?Cardiac: Irregularly irregular, no murmurs, rubs, or gallops.  ?Respiratory: Clear to auscultation  bilaterally. On room air. On 2LNC pox 97%  ?GI: Soft, nontender, non-distended  ?MS: Mild non-pitting bilateral ankle edema; No deformity. ?Neuro:  Nonfocal  ?Psych: Normal affect  ? ?Labs  ?  ?High Sensitivity Troponin:   ?Recent Labs  ?Lab 12/05/21 ?1550 12/05/21 ?2029  ?TROPONINIHS 3 3  ?   ?Chemistry ?Recent Labs  ?Lab 12/05/21 ?1550 12/06/21 ?0230 12/07/21 ?0322  ?NA 137  --  137  ?K 3.7  --  3.8  ?CL 102  --  104  ?CO2 25  --  26  ?GLUCOSE 178*  --  96  ?BUN 18  --  16  ?CREATININE 0.98  --  1.05  ?CALCIUM 9.0  --  8.9  ?MG  --  1.9  --   ?GFRNONAA >60  --  >60  ?ANIONGAP 10  --  7  ?  ?Lipids  ?Recent Labs  ?Lab 12/06/21 ?0230  ?CHOL 111  ?TRIG 187*  ?HDL 34*  ?[place] 40  ?CHOLHDL 3.3  ?  ?Hematology ?Recent Labs  ?Lab 12/05/21 ?1550 12/07/21 ?0322  ?WBC 11.8* 10.4  ?RBC 5.05 4.90  ?HGB 15.2  14.7  ?HCT 44.7 43.0  ?MCV 88.5 87.8  ?MCH 30.1 30.0  ?MCHC 34.0 34.2  ?RDW 13.6 13.5  ?PLT 256 243  ? ?Thyroid  ?Recent Labs  ?Lab 12/06/21 ?0230  ?TSH 3.997  ?  ?BNPNo results for input(s): BNP, PROBNP in the last 168 hours.  ?DDimer No results for input(s): DDIMER in the last 168 hours.  ? ?Radiology  ?  ?DG Chest 2 View ? ?Result Date: 12/05/2021 ?CLINICAL DATA:  Chest pain EXAM: CHEST - 2 VIEW COMPARISON:  Chest XR, 05/29/2021.  CT AP, 11/11/2021. FINDINGS: Cardiomediastinal silhouette is within normal limits. Lungs are well inflated. Trace linear opacity at the LEFT lung base no focal consolidation or mass. No pleural effusion or pneumothorax. No acute displaced fracture. IMPRESSION: No active cardiopulmonary disease. Electronically Signed   By: Michaelle Birks M.D.   On: 12/05/2021 15:40  ? ?ECHOCARDIOGRAM COMPLETE ? ?Result Date: 12/06/2021 ?   ECHOCARDIOGRAM REPORT   Patient Name:   BRION PRUET Date of Exam: 12/06/2021 Medical Rec #:  [id number]  Height:       75.0 in Accession #:    [id number] Weight:       263.6 lb Date of Birth:  06-09-1956  BSA:          2.468 m? Patient Age:    66 years   BP:           113/78 mmHg  Patient Gender: M          HR:           76 bpm. Exam Location:  Inpatient Procedure: 2D Echo Indications:    Atrial flutter  History:        Patient has prior history of Echocardiogram examinations, most                 recent 05/30/2021. Arrythmias:Atrial Fibrillation; Risk                 Factors:Dyslipidemia.  Sonographer:    [name] Referring Phys: [id number] FAN YE  Sonographer Comments: Image acquisition challenging due to patient body habitus. IMPRESSIONS  1. Left ventricular ejection fraction, by estimation, is 65 to 70%. The left ventricle has hyperdynamic function. The left ventricle has no regional wall motion abnormalities. There is mild concentric left ventricular hypertrophy. Left ventricular diastolic function could not be evaluated.  2. Right ventricular systolic function was not well visualized. The right ventricular size is normal. There is normal pulmonary artery systolic pressure.  3. The mitral valve is normal in structure. Mild mitral valve regurgitation. No evidence of mitral stenosis.  4. The aortic valve is normal in structure. Aortic valve regurgitation is not visualized. No aortic stenosis is present.  5. The inferior vena cava is dilated in size with >50% respiratory variability, suggesting right atrial pressure of 8 mmHg. FINDINGS  Left Ventricle: Left ventricular ejection fraction, by estimation, is 65 to 70%. The left ventricle has hyperdynamic function. The left ventricle has no regional wall motion abnormalities. The left ventricular internal cavity size was normal in size. There is mild concentric left ventricular hypertrophy. Left ventricular diastolic function could not be evaluated. Right Ventricle: The right ventricular size is normal. No increase in right ventricular wall thickness. Right ventricular systolic function was not well visualized. There is normal pulmonary artery systolic pressure. The tricuspid regurgitant velocity is  1.81 m/s, and with an assumed right atrial  pressure of 8 mmHg, the estimated right ventricular systolic pressure is 123456 mmHg. Left Atrium:  Left atrial size was normal in size. Right Atrium: Right atrial size was not assessed. Pericardium: There is no evidence of pericardial effusion. Presence of epicardial fat layer. Mitral Valve: The mitral valve is normal in structure. Mild mitral valve regurgitation. No evidence of mitral valve stenosis. Tricuspid Valve: The tricuspid valve is normal in structure. Tricuspid valve regurgitation is not demonstrated. No evidence of tricuspid stenosis. Aortic Valve: The aortic valve is normal in structure. Aortic valve regurgitation is not visualized. No aortic stenosis is present. Aortic valve mean gradient measures 4.0 mmHg. Aortic valve peak gradient measures 7.5 mmHg. Aortic valve area, by VTI measures 2.95 cm?. Pulmonic Valve: The pulmonic valve was not well visualized. Pulmonic valve regurgitation is not visualized. No evidence of pulmonic stenosis. Aorta: The aortic root is normal in size and structure. Venous: The inferior vena cava is dilated in size with greater than 50% respiratory variability, suggesting right atrial pressure of 8 mmHg. IAS/Shunts: No atrial level shunt detected by color flow Doppler.  LEFT VENTRICLE PLAX 2D LVIDd:         4.30 cm LVIDs:         2.60 cm LV PW:         1.30 cm LV IVS:        1.30 cm LVOT diam:     2.10 cm LV SV:         76 LV SV Index:   31 LVOT Area:     3.46 cm?  RIGHT VENTRICLE            IVC RV S prime:     9.90 cm/s  IVC diam: 2.20 cm TAPSE (M-mode): 1.7 cm LEFT ATRIUM             Index LA diam:        4.60 cm 1.86 cm/m? LA Vol (A2C):   71.1 ml 28.81 ml/m? LA Vol (A4C):   59.4 ml 24.07 ml/m? LA Biplane Vol: 66.5 ml 26.95 ml/m?  AORTIC VALVE                    PULMONIC VALVE AV Area (Vmax):    3.19 cm?     PV Vmax:       1.09 m/s AV Area (Vmean):   3.30 cm?     PV Peak grad:  4.8 mmHg AV Area (VTI):     2.95 cm? AV Vmax:           137.00 cm/s AV Vmean:          89.600 cm/s AV  VTI:            0.257 m AV Peak Grad:      7.5 mmHg AV Mean Grad:      4.0 mmHg LVOT Vmax:         126.00 cm/s LVOT Vmean:        85.300 cm/s LVOT VTI:          0.219 m LVOT/AV VTI ratio: 0.85  AORTA Ao Ro

## 2021-12-08 ENCOUNTER — Encounter: Payer: Self-pay | Admitting: Internal Medicine

## 2021-12-08 ENCOUNTER — Encounter (HOSPITAL_COMMUNITY): Payer: Self-pay | Admitting: Internal Medicine

## 2021-12-08 DIAGNOSIS — I2510 Atherosclerotic heart disease of native coronary artery without angina pectoris: Secondary | ICD-10-CM

## 2021-12-08 LAB — BASIC METABOLIC PANEL
Anion gap: 9 (ref 5–15)
BUN: 17 mg/dL (ref 8–23)
CO2: 24 mmol/L (ref 22–32)
Calcium: 9.1 mg/dL (ref 8.9–10.3)
Chloride: 104 mmol/L (ref 98–111)
Creatinine, Ser: 1.14 mg/dL (ref 0.61–1.24)
GFR, Estimated: >60 mL/min (ref >60)
Glucose, Bld: 156 mg/dL — ABNORMAL HIGH (ref 70–99)
Potassium: 3.9 mmol/L (ref 3.5–5.1)
Sodium: 137 mmol/L (ref 135–145)

## 2021-12-08 MED ORDER — METOPROLOL TARTRATE 25 MG PO TABS: 25.0000 mg | ORAL_TABLET | Freq: Two times a day (BID) | ORAL | 1 refills | Status: DC

## 2021-12-08 MED ORDER — NITROGLYCERIN 0.4 MG SL SUBL: 0.4000 mg | SUBLINGUAL_TABLET | SUBLINGUAL | 2 refills | Status: DC | PRN

## 2021-12-08 MED ORDER — APIXABAN 5 MG PO TABS
5.0000 mg | ORAL_TABLET | Freq: Two times a day (BID) | ORAL | Status: DC
  Administered 2021-12-08: 5 mg via ORAL
  Filled 2021-12-08: qty 1

## 2021-12-08 NOTE — Care Management Important Message (Signed)
Important Message ? ?Patient Details  ?Name: Richard Vazquez ?MRN: [id number] ?Date of Birth: 05-18-1956 ? ? ?Medicare Important Message Given:  Yes ? ? ? ? ?[name] ?12/08/2021, 9:55 AM ?

## 2021-12-08 NOTE — Discharge Summary (Addendum)
?Discharge Summary  ?  ?Richard Vazquez ID: Richard Vazquez ?MRN: [id number]; DOB: Dec 06, 1955 ? ?Admit date: 12/05/2021 ?Discharge date: 12/08/2021 ? ?PCP:  Katherine Basset, PA-C ?  ?[facility] Providers ?Cardiologist:  Dietrich Pates, MD  ?Cardiology APP:  Beatrice Lecher, PA-C  { ? ? ?Discharge Diagnoses  ?  ?Principal Problem: ?  CAD (coronary artery disease) ?Active Problems: ?  Persistent atrial fibrillation (HCC) ?  Chest pain ? ? ? ?Diagnostic Studies/Procedures  ?  ?Left heart catheterization from 12/07/2021: ? ?  LV end diastolic pressure is normal. ?  ?1.  Mild non-obstructive coronary artery disease. ?2.  LVEDP of 13 mmHg. ?  ?Recommendation: Medical therapy. ? ?Diagnostic ?Dominance: Right ? ?  ?Echocardiogram from 12/06/2021: ? ? ?1. Left ventricular ejection fraction, by estimation, is 65 to 70%. The  ?left ventricle has hyperdynamic function. The left ventricle has no  ?regional wall motion abnormalities. There is mild concentric left  ?ventricular hypertrophy. Left ventricular  ?diastolic function could not be evaluated.  ? 2. Right ventricular systolic function was not well visualized. The right  ?ventricular size is normal. There is normal pulmonary artery systolic  ?pressure.  ? 3. The mitral valve is normal in structure. Mild mitral valve  ?regurgitation. No evidence of mitral stenosis.  ? 4. The aortic valve is normal in structure. Aortic valve regurgitation is  ?not visualized. No aortic stenosis is present.  ? 5. The inferior vena cava is dilated in size with >50% respiratory  ?variability, suggesting right atrial pressure of 8 mmHg.  ? ?_____________ ?  ?History of Present Illness   ?  ?Per H&P/10/23: ? ?Richard Vazquez is pleasant and was seen at bedside. Richard Vazquez reported intermittent mid-chest sharp/tightness without radiation in the past 3 days. The pain usually lasts less than one minute. The pain was worsened with walking or physical activity, and gradually resolved when Richard Vazquez sits down or take a break. The  pain was not related to changes in position, cough or deep inspiration. Richard Vazquez also reported associated symptoms including lightheadedness, near-syncope (one time), shortness of breath and heart palpitation. Richard Vazquez said Richard Vazquez has been monitoring his HR and BP at home which were 70-130bpm and approximately 130/16mmHg, respectively. Richard Vazquez said Richard Vazquez usually experiences shortness of breath and fatigue when Richard Vazquez has recurrent AFL episodes, Richard Vazquez has never had this kind of pain in the past. One time the pain was so severe that woke him up. At the encounter, Richard Vazquez was chest pain free, on room air, no acute distress. Richard Vazquez denied fever, chills, dizziness, syncope, cough, nausea, vomiting, PND, early satiety, abdominal fullness, dysuria, diarrhea, pedal edema or any bleeding events. ?  ?Richard Vazquez was compliant with his medications. ?  ?OSH ?K 3.7, Cr 0.98, WBC 11.8, troponin x 2 negative ?ECG AFL ?  ? ?Hospital Course  ?   ?Consultants: N/A  ? ?Mild non-obstructive CAD ?- presented with new onset intermittent chest pain worsening with exertion over the past 3 days, with lightheadedness, near-syncope, shortness of breath and heart palpitation ?- Hs trop negative x2 ?- EKG with atrial flutter  ?- Echo from 12/06/21 showed EF 65-70%, no RWMA, unable evaluate diastolic dysfunction and RV, normal PASP, mild MR ?- LDL 40, A1C 6%, and TSH 3.9 ?-Left heart catheterization on 12/07/2021 revealed mild CAD, LVEDP 13 mmHg. Richard Vazquez is recommended to continue medical therapy ?- Post cath renal index stable today  ?- will discharge on metoprolol, Crestor, Lovaza; no ASA in the setting of Eliquis use ?- follow up arranged on 12/21/21  with cardiology  ?  ?Permanent  atrial flutter  ?- initially diagnosed on 05/2021, underwent cardioversion x2, later had recurrence, ablation is planned by Dr Caryl Comes in May 2023  ?- historically on Eliquis for anticoagulation for CHADS2VASc 2, resumed after cath ?- rates has been fluctuate, added metoprolol 25mg  BID added 4/11 for rate  control, noted brief slowed rate at 38s  at night over the past 24 hours, reviewed with Dr Harrington Challenger, given elevated HR 100s during awake time, will continue metoprolol 25mg  BID in addition to home med Cardizem for now   ? ?HLD ?- LDL 40, continue crestor and lovaza   ?  ?OSA ?- CPAP encouraged at night  ?  ?Pre-diabetes ?Obesity ?- A1C 6% ?- lifestyle modification discussed , not on meds ? ? ?Did the Richard Vazquez have an acute coronary syndrome (MI, NSTEMI, STEMI, etc) this admission?:  No                               ?Did the Richard Vazquez have a percutaneous coronary intervention (stent / angioplasty)?:  No.   ? ?   ? ?  ?_____________ ? ?Discharge Vitals ?Blood pressure 114/73, pulse 100, temperature 97.9 ?F (36.6 ?C), temperature source Oral, resp. rate 19, height 6\' 3"  (1.905 m), weight 119.6 kg, SpO2 94 %.  ?Filed Weights  ? 12/05/21 1522 12/05/21 2245  ?Weight: 122.5 kg 119.6 kg  ? ? ?Physical exam: ? ?Vitals:  ?Vitals:  ? 12/08/21 0814 12/08/21 0845  ?BP:  114/73  ?Pulse:  100  ?Resp:  19  ?Temp: 97.9 ?F (36.6 ?C) 97.9 ?F (36.6 ?C)  ?SpO2:  94%  ? ?General Appearance: In no apparent distress, laying in bed ?HEENT: Normocephalic, atraumatic.  ?Neck: Supple, trachea midline, no JVDs ?Cardiovascular: Irregularly irregular, normal S1-S2,  no murmur/rub/gallop, S3/S4 ?Respiratory: Resting breathing unlabored, lungs sounds clear to auscultation bilaterally, no use of accessory muscles. On room air.  No wheezes, rales or rhonchi.   ?Gastrointestinal: Bowel sounds positive, abdomen soft, obese ?Extremities: Able to move all extremities in bed without difficulty, no edema/cyanosis/clubbing, right radial pulse 2+, right hand warm to touch, no neurovascular deficit, post cath site clean with dressing, no hematoma or bleeding  ?Musculoskeletal: Normal muscle bulk and tone ?Skin: Intact, warm, dry.  ?Neurologic: Alert, oriented to person, place and time. Fluent speech, no facial droop, no cognitive deficit ?Psychiatric: Normal affect.  Mood is appropriate.  ? ? ?Labs & Radiologic Studies  ?  ?CBC ?Recent Labs  ?  12/05/21 ?1550 12/07/21 ?0322  ?WBC 11.8* 10.4  ?HGB 15.2 14.7  ?HCT 44.7 43.0  ?MCV 88.5 87.8  ?PLT 256 243  ? ?Basic Metabolic Panel ?Recent Labs  ?  12/06/21 ?0230 12/07/21 ?[id number] 12/08/21 ?0751  ?NA  --  137 137  ?K  --  3.8 3.9  ?CL  --  104 104  ?CO2  --  26 24  ?GLUCOSE  --  96 156*  ?BUN  --  16 17  ?CREATININE  --  1.05 1.14  ?CALCIUM  --  8.9 9.1  ?MG 1.9  --   --   ? ?Liver Function Tests ?No results for input(s): AST, ALT, ALKPHOS, BILITOT, PROT, ALBUMIN in the last 72 hours. ?No results for input(s): LIPASE, AMYLASE in the last 72 hours. ?High Sensitivity Troponin:   ?Recent Labs  ?Lab 12/05/21 ?1550 12/05/21 ?2029  ?TROPONINIHS 3 3  ?  ?BNP ?Invalid input(s): POCBNP ?D-Dimer ?No  results for input(s): DDIMER in the last 72 hours. ?Hemoglobin A1C ?Recent Labs  ?  12/06/21 ?0230  ?HGBA1C 6.0*  ? ?Fasting Lipid Panel ?Recent Labs  ?  12/06/21 ?0230  ?CHOL 111  ?HDL 34*  ?[place] 40  ?TRIG 187*  ?CHOLHDL 3.3  ? ?Thyroid Function Tests ?Recent Labs  ?  12/06/21 ?0230  ?TSH 3.997  ? ?_____________  ?DG Chest 2 View ? ?Result Date: 12/05/2021 ?CLINICAL DATA:  Chest pain EXAM: CHEST - 2 VIEW COMPARISON:  Chest XR, 05/29/2021.  CT AP, 11/11/2021. FINDINGS: Cardiomediastinal silhouette is within normal limits. Lungs are well inflated. Trace linear opacity at the LEFT lung base no focal consolidation or mass. No pleural effusion or pneumothorax. No acute displaced fracture. IMPRESSION: No active cardiopulmonary disease. Electronically Signed   By: Michaelle Birks M.D.   On: 12/05/2021 15:40  ? ?CARDIAC CATHETERIZATION ? ?Result Date: 12/07/2021 ?  LV end diastolic pressure is normal. 1.  Mild obstructive coronary artery disease. 2.  LVEDP of 13 mmHg. Recommendation: Medical therapy.  ? ?ECHOCARDIOGRAM COMPLETE ? ?Result Date: 12/06/2021 ?   ECHOCARDIOGRAM REPORT   Richard Vazquez Name:   JONANTHAN ANDREOLA Date of Exam: 12/06/2021 Medical Rec #:  [id number]   Height:       75.0 in Accession #:    [id number] Weight:       263.6 lb Date of Birth:  1956/06/16  BSA:          2.468 m? Richard Vazquez Age:    42 years   BP:           113/78 mmHg Richard Vazquez Gender: M          HR:

## 2021-12-08 NOTE — Progress Notes (Addendum)
Nursing dc note ? ?Patient  and wife verbalized understanding of dc instructions. Patient also instructed to monitor blood pressures and heart rate at home.Patient made aware of lopressor medicine side effects.[facility]cmd notified of dc order. All belongings given  to patient. ?

## 2021-12-08 NOTE — TOC Transition Note (Signed)
Transition of Care (TOC) - CM/SW Discharge Note ? ? ?Patient Details  ?Name: Richard Vazquez ?MRN: [id number] ?Date of Birth: 29-Sep-1955 ? ?Transition of Care (TOC) CM/SW Contact:  ?Leone Haven, RN ?Phone Number: ?12/08/2021, 10:08 AM ? ? ?Clinical Narrative:    ?Patient is for dc today, wife will transport him home. He has no other needs.  ? ? ?  ?  ? ? ?Patient Goals and CMS Choice ?  ?  ?  ? ?Discharge Placement ?  ?           ?  ?  ?  ?  ? ?Discharge Plan and Services ?  ?  ?           ?  ?  ?  ?  ?  ?  ?  ?  ?  ?  ? ?Social Determinants of Health (SDOH) Interventions ?  ? ? ?Readmission Risk Interventions ?   ? View : No data to display.  ?  ?  ?  ? ? ? ? ? ?

## 2021-12-09 DIAGNOSIS — N400 Enlarged prostate without lower urinary tract symptoms: Secondary | ICD-10-CM | POA: Diagnosis not present

## 2021-12-09 DIAGNOSIS — Z87442 Personal history of urinary calculi: Secondary | ICD-10-CM | POA: Diagnosis not present

## 2021-12-12 NOTE — Progress Notes (Signed)
? ?Cardiology Office Note   ? ?Date:  12/21/2021  ? ?ID:  Richard Vazquez, DOB 08/01/1956, MRN [id number] ? ? ?PCP:  Bonnita Hollow, MD ?  ?Richard Vazquez  ?Cardiologist:  Dorris Carnes, MD   ?Advanced Practice Provider:  Liliane Shi, PA-C ?Electrophysiologist:  None  ? ?[id number]  ? ?Chief Complaint  ?Patient presents with  ? Hospitalization Follow-up  ? ? ?History of Present Illness:  ?Richard Vazquez is a 66 y.o. male history of hypertension, obesity, OSA, hyperlipidemia.  He was diagnosed with atrial flutter 05/2021 and underwent cardioversion x2 with recurrent a flutter.  He returned to the hospital with chest pain and underwent cardiac cath 12/07/2021 that showed mild nonobstructive disease.  He was in atrial flutter with some fast rates.  Plan is for ablation with Dr. Caryl Comes 12/2021. ? ?Patient comes in for hospital f/u. Denies any further palpitations, chest pain, dyspnea, dizziness. Chronic LE edema. Symptoms improved since increase in metoprolol. Is going to have to have lithotripsy and ureter stent in future and will have to be off Eliquis. To discuss with Dr. Caryl Comes after ablation.  ? ? ? ?Past Medical History:  ?Diagnosis Date  ? Carotid artery disease ([place])   ? Carotid US 12/22: R 40-59; L 1-39  ? Former smoker 12/21/2015  ? Hyperlipemia   ? Kidney stones   ? Left carotid bruit 08/05/2021  ? Obesity   ? Paroxysmal atrial flutter ([place]) 05/29/2021  ? ? ?Past Surgical History:  ?Procedure Laterality Date  ? APPENDECTOMY    ? CARDIOVERSION N/A 05/31/2021  ? Procedure: CARDIOVERSION;  Surgeon: [name], MD;  Location: [place];  Service: Cardiovascular;  Laterality: N/A;  ? CARDIOVERSION N/A 10/05/2021  ? Procedure: CARDIOVERSION;  Surgeon: [name], MD;  Location: [place];  Service: Cardiovascular;  Laterality: N/A;  ? LEFT HEART CATH AND CORONARY ANGIOGRAPHY N/A 12/07/2021  ? Procedure: LEFT HEART CATH AND CORONARY ANGIOGRAPHY;  Surgeon: [name], MD;  Location: [place] [facility];  Service: Cardiovascular;  Laterality: N/A;  ? TEE WITHOUT CARDIOVERSION N/A 05/31/2021  ? Procedure: TRANSESOPHAGEAL ECHOCARDIOGRAM (TEE);  Surgeon: [name], MD;  Location: [place];  Service: Cardiovascular;  Laterality: N/A;  ? ? ?Current Medications: ?Current Meds  ?Medication Sig  ? acetaminophen (TYLENOL) 325 MG tablet Take 325-650 mg by mouth every 6 (six) hours as needed for headache.  ? apixaban (ELIQUIS) 5 MG TABS tablet Take 1 tablet (5 mg total) by mouth 2 (two) times daily.  ? bisacodyl (DULCOLAX) 5 MG EC tablet Take 5 mg by mouth daily as needed for moderate constipation.  ? diltiazem (CARDIZEM CD) 180 MG 24 hr capsule Take 1 capsule (180 mg total) by mouth daily.  ? indapamide (LOZOL) 2.5 MG tablet Take 2.5 mg by mouth daily.  ? Iron, Ferrous Sulfate, 325 (65 Fe) MG TABS Take 325 mg by mouth every other day.  ? metoprolol tartrate (LOPRESSOR) 25 MG tablet Take 1 tablet (25 mg total) by mouth 2 (two) times daily.  ? Multiple Vitamin (MULTIVITAMIN) tablet Take 1 tablet by mouth daily.  ? nitroGLYCERIN (NITROSTAT) 0.4 MG SL tablet Place 1 tablet (0.4 mg total) under the tongue every 5 (five) minutes as needed for chest pain (Max 3 doses).  ? Omega-3 1000 MG CAPS Take 1,000 mg by mouth daily.  ? rosuvastatin (CRESTOR) 20 MG tablet Take 1 tablet (20 mg total) by mouth daily.  ? vitamin B-12 (CYANOCOBALAMIN) 500 MCG tablet Take  500 mcg by mouth daily.  ?  ? ?Allergies:   Atorvastatin, Codeine, and Latex  ? ?Social History  ? ?Socioeconomic History  ? Marital status: Married  ?  Spouse name: Not on file  ? Number of children: Not on file  ? Years of education: Not on file  ? Highest education level: Not on file  ?Occupational History  ? Not on file  ?Tobacco Use  ? Smoking status: Former  ?  Packs/day: 1.50  ?  Years: 40.00  ?  Pack years: 60.00  ?  Types: Cigarettes  ?  Quit date: 12/09/2019  ?  Years since quitting: 2.0  ?  Passive exposure: Past  ? Smokeless tobacco: Never   ?Vaping Use  ? Vaping Use: Never used  ?Substance and Sexual Activity  ? Alcohol use: Not Currently  ? Drug use: Never  ? Sexual activity: Not Currently  ?Other Topics Concern  ? Not on file  ?Social History Narrative  ? Not on file  ? ?Social Determinants of Health  ? ?Financial Resource Strain: Not on file  ?Food Insecurity: Not on file  ?Transportation Needs: Not on file  ?Physical Activity: Not on file  ?Stress: Not on file  ?Social Connections: Not on file  ?  ? ?Family History:  The patient's  family history is not on file.  ? ?ROS:   ?Please see the history of present illness.    ?ROS All other systems reviewed and are negative. ? ? ?PHYSICAL EXAM:   ?VS:  BP (!) 124/58   Pulse 60   Ht 6\' 3"  (1.905 m)   Wt 275 lb (124.7 kg)   SpO2 95%   BMI 34.37 kg/m?   ?Physical Exam  ?GEN: Well nourished, well developed, in no acute distress  ?Neck: no JVD, carotid bruits, or masses ?Cardiac:RRR; no murmurs, rubs, or gallops  ?Respiratory:  clear to auscultation bilaterally, normal work of breathing ?GI: soft, nontender, nondistended, + BS ?Ext: without cyanosis, clubbing, or edema, Good distal pulses bilaterally ?Neuro:  Alert and Oriented x 3, ?Psych: euthymic mood, full affect ? ?Wt Readings from Last 3 Encounters:  ?12/21/21 275 lb (124.7 kg)  ?12/14/21 276 lb 3.2 oz (125.3 kg)  ?12/05/21 263 lb 9.6 oz (119.6 kg)  ?  ? ? ?Studies/Labs Reviewed:  ? ?EKG:  EKG is not ordered today.    ? ?Recent Labs: ?05/29/2021: B Natriuretic Peptide 285.6 ?08/05/2021: ALT 15 ?12/06/2021: Magnesium 1.9; TSH 3.997 ?12/07/2021: Hemoglobin 14.7; Platelets 243 ?12/08/2021: BUN 17; Creatinine, Ser 1.14; Potassium 3.9; Sodium 137  ? ?Lipid Panel ?   ?Component Value Date/Time  ? CHOL 111 12/06/2021 0230  ? CHOL 183 08/05/2021 0924  ? TRIG 187 (H) 12/06/2021 0230  ? HDL 34 (L) 12/06/2021 0230  ? HDL 36 (L) 08/05/2021 0924  ? CHOLHDL 3.3 12/06/2021 0230  ? VLDL 37 12/06/2021 0230  ? Uncertain 40 12/06/2021 0230  ? [place] 126 (H) 08/05/2021 [id number]   ? ? ?Additional studies/ records that were reviewed today include:  ? ?Abdominal CT 11/2021 [facility] ? ?IMPRESSION:  ?1. Increased size and number of bilateral renal calculi, more  ?numerous on the right. No evidence of ureteral or bladder calculus.  ?2. Chronic right renal cortical thinning with progressive dilatation  ?of the right renal pelvis and calyces. There is associated wall  ?thickening at the ureteropelvic junction which could be inflammatory  ?or secondary to fibrosis. No well-defined urothelial lesion  ?identified. Recommend correlation with urine cytology and  ?consideration  of retrograde evaluation of the ureters to exclude  ?infiltrating neoplasm.  ?3. Mild enlargement of 3.1 cm abdominal aortic aneurysm. Recommend  ?follow-up every 3 years.  ?Reference: J Am Coll Radiol [id number]. Aortic Atherosclerosis  ?(ICD10-I70.0).  ? ? ?Electronically Signed  ?  By: Richardean Sale M.D.  ?  On: 11/11/2021 13:55  ?Left heart catheterization from 12/07/2021: ?  ?  LV end diastolic pressure is normal. ?  ?1.  Mild non-obstructive coronary artery disease. ?2.  LVEDP of 13 mmHg. ?  ?Recommendation: Medical therapy. ?  ?Diagnostic ?Dominance: Right ?  ?Echocardiogram from 12/06/2021: ?  ?  ?1. Left ventricular ejection fraction, by estimation, is 65 to 70%. The  ?left ventricle has hyperdynamic function. The left ventricle has no  ?regional wall motion abnormalities. There is mild concentric left  ?ventricular hypertrophy. Left ventricular  ?diastolic function could not be evaluated.  ? 2. Right ventricular systolic function was not well visualized. The right  ?ventricular size is normal. There is normal pulmonary artery systolic  ?pressure.  ? 3. The mitral valve is normal in structure. Mild mitral valve  ?regurgitation. No evidence of mitral stenosis.  ? 4. The aortic valve is normal in structure. Aortic valve regurgitation is  ?not visualized. No aortic stenosis is present.  ? 5. The inferior vena cava is  dilated in size with >50% respiratory  ?variability, suggesting right atrial pressure of 8 mmHg.  ?  ? ? ?Risk Assessment/Calculations:   ? ?CHA2DS2-VASc Score = 2  ? This indicates a 2.2% annual risk of stroke. ?The

## 2021-12-14 ENCOUNTER — Encounter: Payer: Self-pay | Admitting: Family Medicine

## 2021-12-14 ENCOUNTER — Ambulatory Visit (INDEPENDENT_AMBULATORY_CARE_PROVIDER_SITE_OTHER): Payer: Medicare Other | Admitting: Family Medicine

## 2021-12-14 VITALS — BP 134/70 | HR 68 | Temp 97.0°F | Ht 75.0 in | Wt 276.2 lb

## 2021-12-14 DIAGNOSIS — E6609 Other obesity due to excess calories: Secondary | ICD-10-CM

## 2021-12-14 DIAGNOSIS — N200 Calculus of kidney: Secondary | ICD-10-CM | POA: Diagnosis not present

## 2021-12-14 DIAGNOSIS — K5909 Other constipation: Secondary | ICD-10-CM

## 2021-12-14 DIAGNOSIS — L409 Psoriasis, unspecified: Secondary | ICD-10-CM

## 2021-12-14 DIAGNOSIS — R3129 Other microscopic hematuria: Secondary | ICD-10-CM

## 2021-12-14 DIAGNOSIS — I25118 Atherosclerotic heart disease of native coronary artery with other forms of angina pectoris: Secondary | ICD-10-CM

## 2021-12-14 DIAGNOSIS — I4819 Other persistent atrial fibrillation: Secondary | ICD-10-CM

## 2021-12-14 DIAGNOSIS — E7800 Pure hypercholesterolemia, unspecified: Secondary | ICD-10-CM

## 2021-12-14 DIAGNOSIS — G4726 Circadian rhythm sleep disorder, shift work type: Secondary | ICD-10-CM

## 2021-12-14 DIAGNOSIS — Z6835 Body mass index (BMI) 35.0-35.9, adult: Secondary | ICD-10-CM

## 2021-12-14 DIAGNOSIS — R319 Hematuria, unspecified: Secondary | ICD-10-CM

## 2021-12-14 DIAGNOSIS — G4733 Obstructive sleep apnea (adult) (pediatric): Secondary | ICD-10-CM | POA: Diagnosis not present

## 2021-12-14 DIAGNOSIS — R7303 Prediabetes: Secondary | ICD-10-CM

## 2021-12-14 MED ORDER — METOPROLOL TARTRATE 25 MG PO TABS: 25.0000 mg | ORAL_TABLET | Freq: Two times a day (BID) | ORAL | 1 refills | Status: DC

## 2021-12-14 NOTE — Assessment & Plan Note (Signed)
Follows with cardiology ?Anticoagulated with apixaban ?On moderate intensity statin ?

## 2021-12-14 NOTE — Assessment & Plan Note (Signed)
Follows with cardiology ?Status post 2 ablations, third ablation plan for 12/2021 ?Rate controlled with metoprolol 25 mg twice daily and diltiazem XR 180 mg daily ? ?

## 2021-12-14 NOTE — Assessment & Plan Note (Signed)
Lipid panel less than 70 on 11/2021 ?Well-controlled on rosuvastatin 20 mg daily ?

## 2021-12-14 NOTE — Progress Notes (Signed)
? ?New Patient Office Visit ? ?Subjective   ? ?Patient ID: Richard Vazquez, male    DOB: 02-19-1956  Age: 66 y.o. MRN: [id number] ? ?CC:  ?Chief Complaint  ?Patient presents with  ? Establish Care  ?  Np est care. Follow up after hospital visit.   ? ? ?HPI ?Richard Vazquez presents to establish care.  ? ?Patient recent hospitalization and 11/2021 for atrial fibrillation and chest pain secondary to 2 CAD. ? ?Denies any chest pain or shortness of breath.  Feels that rate is better controlled now that he is on metoprolol and diltiazem. ? ? ? ? ? ? ?Outpatient Encounter Medications as of 12/14/2021  ?Medication Sig  ? acetaminophen (TYLENOL) 325 MG tablet Take 325-650 mg by mouth every 6 (six) hours as needed for headache.  ? apixaban (ELIQUIS) 5 MG TABS tablet Take 1 tablet (5 mg total) by mouth 2 (two) times daily.  ? bisacodyl (DULCOLAX) 5 MG EC tablet Take 5 mg by mouth daily as needed for moderate constipation.  ? diltiazem (CARDIZEM CD) 180 MG 24 hr capsule Take 1 capsule (180 mg total) by mouth daily.  ? indapamide (LOZOL) 2.5 MG tablet Take 2.5 mg by mouth daily.  ? Iron, Ferrous Sulfate, 325 (65 Fe) MG TABS Take 325 mg by mouth every other day.  ? Multiple Vitamin (MULTIVITAMIN) tablet Take 1 tablet by mouth daily.  ? nitroGLYCERIN (NITROSTAT) 0.4 MG SL tablet Place 1 tablet (0.4 mg total) under the tongue every 5 (five) minutes as needed for chest pain (Max 3 doses).  ? Omega-3 1000 MG CAPS Take 1,000 mg by mouth daily.  ? rosuvastatin (CRESTOR) 20 MG tablet Take 1 tablet (20 mg total) by mouth daily.  ? vitamin B-12 (CYANOCOBALAMIN) 500 MCG tablet Take 500 mcg by mouth daily.  ? [DISCONTINUED] metoprolol tartrate (LOPRESSOR) 25 MG tablet Take one tablet (25 mg) as needed for heart racing. (Patient taking differently: Take 25 mg by mouth daily as needed (heart racing).)  ? [DISCONTINUED] metoprolol tartrate (LOPRESSOR) 25 MG tablet Take 1 tablet (25 mg total) by mouth 2 (two) times daily.  ? metoprolol tartrate  (LOPRESSOR) 25 MG tablet Take 1 tablet (25 mg total) by mouth 2 (two) times daily.  ? ?No facility-administered encounter medications on file as of 12/14/2021.  ? ? ?Past Medical History:  ?Diagnosis Date  ? Carotid artery disease ([place])   ? Carotid US 12/22: R 40-59; L 1-39  ? Former smoker 12/21/2015  ? Hyperlipemia   ? Kidney stones   ? Left carotid bruit 08/05/2021  ? Obesity   ? Paroxysmal atrial flutter ([place]) 05/29/2021  ? ? ?Past Surgical History:  ?Procedure Laterality Date  ? APPENDECTOMY    ? CARDIOVERSION N/A 05/31/2021  ? Procedure: CARDIOVERSION;  Surgeon: [name], MD;  Location: [facility];  Service: Cardiovascular;  Laterality: N/A;  ? CARDIOVERSION N/A 10/05/2021  ? Procedure: CARDIOVERSION;  Surgeon: [name], MD;  Location: [facility];  Service: Cardiovascular;  Laterality: N/A;  ? LEFT HEART CATH AND CORONARY ANGIOGRAPHY N/A 12/07/2021  ? Procedure: LEFT HEART CATH AND CORONARY ANGIOGRAPHY;  Surgeon: [name], MD;  Location: [facility];  Service: Cardiovascular;  Laterality: N/A;  ? TEE WITHOUT CARDIOVERSION N/A 05/31/2021  ? Procedure: TRANSESOPHAGEAL ECHOCARDIOGRAM (TEE);  Surgeon: [name], MD;  Location: [facility];  Service: Cardiovascular;  Laterality: N/A;  ? ? ?History reviewed. No pertinent family history. ? ?Social History  ? ?Socioeconomic History  ? Marital  status: Married  ?  Spouse name: Not on file  ? Number of children: Not on file  ? Years of education: Not on file  ? Highest education level: Not on file  ?Occupational History  ? Not on file  ?Tobacco Use  ? Smoking status: Former  ?  Packs/day: 1.50  ?  Years: 40.00  ?  Pack years: 60.00  ?  Types: Cigarettes  ?  Quit date: 12/09/2019  ?  Years since quitting: 2.0  ?  Passive exposure: Past  ? Smokeless tobacco: Never  ?Vaping Use  ? Vaping Use: Never used  ?Substance and Sexual Activity  ? Alcohol use: Not Currently  ? Drug use: Never  ? Sexual activity: Not Currently  ?Other Topics Concern   ? Not on file  ?Social History Narrative  ? Not on file  ? ?Social Determinants of Health  ? ?Financial Resource Strain: Not on file  ?Food Insecurity: Not on file  ?Transportation Needs: Not on file  ?Physical Activity: Not on file  ?Stress: Not on file  ?Social Connections: Not on file  ?Intimate Partner Violence: Not on file  ? ? ?ROS ?As per HPI ?  ? ? ?Objective   ? ?BP 134/70 (BP Location: Left Arm, Patient Position: Sitting, Cuff Size: Normal)   Pulse 68   Temp (!) 97 ?F (36.1 ?C) (Temporal)   Ht 6\' 3"  (1.905 m)   Wt 276 lb 3.2 oz (125.3 kg)   SpO2 95%   BMI 34.52 kg/m?  ? ?Physical Exam ? ? ?  ? ?Assessment & Plan:  ? ?Problem List Items Addressed This Visit   ? ?  ? Cardiovascular and Mediastinum  ? Persistent atrial fibrillation ([place])  ?  Follows with cardiology ?Status post 2 ablations, third ablation plan for 12/2021 ?Rate controlled with metoprolol 25 mg twice daily and diltiazem XR 180 mg daily ? ? ?  ?  ? Relevant Medications  ? metoprolol tartrate (LOPRESSOR) 25 MG tablet  ? CAD (coronary artery disease)  ?  Follows with cardiology ?Anticoagulated with apixaban ?On moderate intensity statin ? ?  ?  ? Relevant Medications  ? metoprolol tartrate (LOPRESSOR) 25 MG tablet  ?  ? Respiratory  ? OSA (obstructive sleep apnea)  ?  Follows with [facility] ?Has had 1 home sleep study showed OSA ?He is scheduled for his in person sleep study on 01/2022 ? ?  ?  ?  ? Digestive  ? Chronic constipation  ?  Improved on Metamucil ?Encouraged adequate p.o. hydration ? ?  ?  ?  ? Genitourinary  ? Renal stones  ?  Likely causing microscopic hematuria ?Follows with urology ? ?  ?  ?  ? Other  ? Hyperlipemia  ?  Lipid panel less than 70 on 11/2021 ?Well-controlled on rosuvastatin 20 mg daily ? ?  ?  ? Relevant Medications  ? metoprolol tartrate (LOPRESSOR) 25 MG tablet  ? Hematuria  ?  Asymptomatic ?Follows with urology ? ? ?  ?  ? Shift work sleep disorder - Primary  ?  Likely worsened by sleep  apnea ?Recommend sleep hygiene ?May try OTC melatonin ?Follow-up after sleep study in June/2023 ? ?  ?  ? Class 2 obesity due to excess calories without serious comorbidity with body mass index (BMI) of 35.0 to 35.9 in adult  ?  Working on [employer] ?Recommend additional exercise as well ? ?  ?  ? Prediabetes  ?  Recommend continue low-carb diet ? ?  ?  ? ?Other Visit Diagnoses   ? ? Microscopic hematuria      ? Psoriasis      ? ?  ? ? ?Return in about 6 months (around 06/15/2022).  ? ?Bonnita Hollow, MD ? ? ?

## 2021-12-14 NOTE — Assessment & Plan Note (Signed)
Asymptomatic ?Follows with urology ? ?

## 2021-12-14 NOTE — Assessment & Plan Note (Signed)
Likely causing microscopic hematuria ?Follows with urology ?

## 2021-12-14 NOTE — Assessment & Plan Note (Signed)
Likely worsened by sleep apnea ?Recommend sleep hygiene ?May try OTC melatonin ?Follow-up after sleep study in June/2023 ?

## 2021-12-14 NOTE — Assessment & Plan Note (Signed)
Recommend continue low-carb diet ?

## 2021-12-14 NOTE — Assessment & Plan Note (Signed)
Working on [employer] ?Recommend additional exercise as well ?

## 2021-12-14 NOTE — Patient Instructions (Signed)
It was a Mudlogger. ?We refilled your metoprolol and sent to pharmacy ?You may try over-the-counter melatonin to help you with sleep. ?

## 2021-12-14 NOTE — Assessment & Plan Note (Signed)
Improved on Metamucil ?Encouraged adequate p.o. hydration ?

## 2021-12-14 NOTE — Assessment & Plan Note (Signed)
Follows with [facility] ?Has had 1 home sleep study showed OSA ?He is scheduled for his in person sleep study on 01/2022 ?

## 2021-12-18 ENCOUNTER — Encounter (HOSPITAL_BASED_OUTPATIENT_CLINIC_OR_DEPARTMENT_OTHER): Payer: Medicare Other | Admitting: Cardiology

## 2021-12-20 NOTE — Telephone Encounter (Signed)
Attempted phone call to pt.  OK per Epic to leave detailed voicemail message.  Pt advised A-Flutter Ablation Instruction letter placed on pt's MyChart.  See Letter for complete details.  Pt advised to contact RN with any questions or concerns.  ?

## 2021-12-21 ENCOUNTER — Ambulatory Visit: Payer: Medicare Other | Admitting: Physician Assistant

## 2021-12-21 ENCOUNTER — Encounter: Payer: Self-pay | Admitting: Physician Assistant

## 2021-12-21 VITALS — BP 124/58 | HR 60 | Ht 75.0 in | Wt 275.0 lb

## 2021-12-21 DIAGNOSIS — R079 Chest pain, unspecified: Secondary | ICD-10-CM

## 2021-12-21 DIAGNOSIS — I714 Abdominal aortic aneurysm, without rupture, unspecified: Secondary | ICD-10-CM

## 2021-12-21 DIAGNOSIS — E785 Hyperlipidemia, unspecified: Secondary | ICD-10-CM

## 2021-12-21 DIAGNOSIS — E6609 Other obesity due to excess calories: Secondary | ICD-10-CM

## 2021-12-21 DIAGNOSIS — G4733 Obstructive sleep apnea (adult) (pediatric): Secondary | ICD-10-CM

## 2021-12-21 DIAGNOSIS — I4892 Unspecified atrial flutter: Secondary | ICD-10-CM

## 2021-12-21 DIAGNOSIS — R7303 Prediabetes: Secondary | ICD-10-CM

## 2021-12-21 DIAGNOSIS — Z6835 Body mass index (BMI) 35.0-35.9, adult: Secondary | ICD-10-CM

## 2021-12-21 NOTE — Patient Instructions (Signed)
Medication Instructions:  ? ?Your physician recommends that you continue on your current medications as directed. Please refer to the Current Medication list given to you today. ? ?*If you need a refill on your cardiac medications before your next appointment, please call your pharmacy* ? ? ?Lab Work: NONE ORDERED  TODAY ? ? ?If you have labs (blood work) drawn today and your tests are completely normal, you will receive your results only by: ?MyChart Message (if you have MyChart) OR ?A paper copy in the mail ?If you have any lab test that is abnormal or we need to change your treatment, we will call you to review the results. ? ? ?Testing/Procedures: NONE ORDERED  TODAY ? ? ? ? ?Follow-Up: ?At [facility], you and your health needs are our priority.  As part of our continuing mission to provide you with exceptional heart care, we have created designated Provider Care Teams.  These Care Teams include your primary Cardiologist (physician) and Advanced Practice Providers (APPs -  Physician Assistants and Nurse Practitioners) who all work together to provide you with the care you need, when you need it. ? ?We recommend signing up for the patient portal called "MyChart".  Sign up information is provided on this After Visit Summary.  MyChart is used to connect with patients for Virtual Visits (Telemedicine).  Patients are able to view lab/test results, encounter notes, upcoming appointments, etc.  Non-urgent messages can be sent to your provider as well.   ?To learn more about what you can do with MyChart, go to [email or web address].   ? ?Your next appointment:  AS SCHEDULED  ? ?Other Instructions ? ? ?Important Information About Sugar ? ? ? ? ?  ?

## 2021-12-30 DIAGNOSIS — N200 Calculus of kidney: Secondary | ICD-10-CM | POA: Diagnosis not present

## 2022-01-09 ENCOUNTER — Encounter: Payer: Self-pay | Admitting: Internal Medicine

## 2022-01-11 ENCOUNTER — Telehealth: Payer: Self-pay | Admitting: Internal Medicine

## 2022-01-11 NOTE — Telephone Encounter (Signed)
?  Pt said, he's been passing stones for 2 days and wondering if that will interfere with his upcoming procedure. He said, he already got all the stones out but still feeling sore.   ?

## 2022-01-11 NOTE — Telephone Encounter (Signed)
Called pt in regards to passing stones.  Pt reports began passing blood in urine Sun 5/14-Mon 5/15.  On Tues 5/16 passed 4 kidney stones.  Described 1 as 1/4 inch and the other 3 about the size of a ball point pen.  Reports today urine is clear d/t increased fluid intake.   Pt reports feels a little more tired than normal.  Has had kidney stones since about the age of 76.  Prior to this encounter had kidney stones about 2-3 months ago.  Denies fever and chills.  Advised pt that MD and RN are not in the office but I will route concern to be addressed.  ?Pt due to come in for lab work and EKG on 02/13/22 in preparation for 01/16/22 A flutter ablation.   ?

## 2022-01-12 ENCOUNTER — Other Ambulatory Visit: Payer: Medicare Other

## 2022-01-12 NOTE — Telephone Encounter (Signed)
Attempted phone call to pt.  OK per Epic to leave detailed voicemail message.  Pt advised to plan to keep lab appt as scheduled on 01/13/22 and will discuss moving forward with procedure at his [facility] scheduled on 01/13/2022.  Pt advised to call [phone] for any further questions or he may send a MyChart message.

## 2022-01-13 ENCOUNTER — Ambulatory Visit: Payer: Medicare Other

## 2022-01-13 ENCOUNTER — Other Ambulatory Visit: Payer: Self-pay

## 2022-01-13 ENCOUNTER — Other Ambulatory Visit: Payer: Medicare Other | Admitting: *Deleted

## 2022-01-13 ENCOUNTER — Other Ambulatory Visit: Payer: Medicare Other

## 2022-01-13 VITALS — BP 142/66 | HR 70 | Ht 75.0 in | Wt 274.0 lb

## 2022-01-13 DIAGNOSIS — I4819 Other persistent atrial fibrillation: Secondary | ICD-10-CM

## 2022-01-13 DIAGNOSIS — I4892 Unspecified atrial flutter: Secondary | ICD-10-CM

## 2022-01-13 DIAGNOSIS — Z01812 Encounter for preprocedural laboratory examination: Secondary | ICD-10-CM | POA: Diagnosis not present

## 2022-01-13 LAB — BASIC METABOLIC PANEL
BUN/Creatinine Ratio: 22 (ref 10–24)
BUN: 22 mg/dL (ref 8–27)
CO2: 26 mmol/L (ref 20–29)
Calcium: 9.2 mg/dL (ref 8.6–10.2)
Chloride: 99 mmol/L (ref 96–106)
Creatinine, Ser: 1 mg/dL (ref 0.76–1.27)
Glucose: 102 mg/dL — ABNORMAL HIGH (ref 70–99)
Potassium: 3.8 mmol/L (ref 3.5–5.2)
Sodium: 140 mmol/L (ref 134–144)
eGFR: 83 mL/min/{1.73_m2} (ref >59)

## 2022-01-13 LAB — CBC
Hematocrit: 41 % (ref 37.5–51.0)
Hemoglobin: 13.9 g/dL (ref 13.0–17.7)
MCH: 29.4 pg (ref 26.6–33.0)
MCHC: 33.9 g/dL (ref 31.5–35.7)
MCV: 87 fL (ref 79–97)
Platelets: 254 10*3/uL (ref 150–450)
RBC: 4.72 x10E6/uL (ref 4.14–5.80)
RDW: 13.8 % (ref 11.6–15.4)
WBC: 9.8 10*3/uL (ref 3.4–10.8)

## 2022-01-13 NOTE — Progress Notes (Signed)
   Nurse Visit   Date of Encounter: 01/13/2022 ID: Richard Vazquez, DOB 1955/10/24, MRN [id number]  PCP:  Garnette Gunner, MD   [facility] : [facility] Providers Cardiologist:  Dietrich Pates, MD Cardiology APP:  Beatrice Lecher, PA-C {     Visit Details   VS:  BP (!) 142/66 (BP Location: Left Arm, Patient Position: Sitting, Cuff Size: Normal)   Pulse 70   Ht 6\' 3"  (1.905 m)   Wt 274 lb (124.3 kg)   SpO2 94%   BMI 34.25 kg/m  , BMI Body mass index is 34.25 kg/m.  Wt Readings from Last 3 Encounters:  01/13/22 274 lb (124.3 kg)  12/21/21 275 lb (124.7 kg)  12/14/21 276 lb 3.2 oz (125.3 kg)     Reason for visit: EKG to determine if pt is in Aflutter Performed today: Vitals, EKG, Education and Provider Consulted Changes (medications, testing, etc.) : None Length of Visit: 30 minutes    Medications Adjustments/Labs and Tests Ordered: Per Dr 12/16/21 pt is in normal sinus rhythm today.  As recommended by Dr Tenny Craw hold Eliquis x 3 doses prior to your ablation.  This would be you morning and evening dose on 01/15/2022 and your am dose on 01/16/2022. Because you stated today that you were in flutter yesterday Dr 01/18/2022 recommends if you do happen to go back into flutter to only hold the 01/16/2022 am dose of medication.  These recommendations have been reviewed with the patient.    Signed, 01/18/2022, RN  01/13/2022 11:16 AM

## 2022-01-13 NOTE — Patient Instructions (Addendum)
Medication Instructions:  Your physician recommends that you continue on your current medications as directed. Please refer to the Current Medication list given to you today.  *If you need a refill on your cardiac medications before your next appointment, please call your pharmacy*   Lab Work: CBC and BMET completed today  If you have labs (blood work) drawn today and your tests are completely normal, you will receive your results only by: [place] (if you have MyChart) OR A paper copy in the mail If you have any lab test that is abnormal or we need to change your treatment, we will call you to review the results.   Testing/Procedures: EKG today   Follow-Up: At [facility], you and your health needs are our priority.  As part of our continuing mission to provide you with exceptional heart care, we have created designated Provider Care Teams.  These Care Teams include your primary Cardiologist (physician) and Advanced Practice Providers (APPs -  Physician Assistants and Nurse Practitioners) who all work together to provide you with the care you need, when you need it.  We recommend signing up for the patient portal called "MyChart".  Sign up information is provided on this After Visit Summary.  MyChart is used to connect with patients for Virtual Visits (Telemedicine).  Patients are able to view lab/test results, encounter notes, upcoming appointments, etc.  Non-urgent messages can be sent to your provider as well.   To learn more about what you can do with MyChart, go to [email or web address].    Your next appointment:   As scheduled    1}   Other Instructions Per Dr Harrington Challenger you are in normal sinus rhythm today. Per Dr Caryl Comes you will hold your Eliquis x 3 doses prior to your ablation.  This would be you morning and evening dose on 01/15/2022 and your am dose on 01/16/2022. Because you stated today that you were in flutter yesterday, Dr Harrington Challenger recommends if you do happen to go  back into flutter to only hold your 01/16/2022 am dose of medication.  These recommendations have been reviewed with the patient.  Important Information About Sugar

## 2022-01-13 NOTE — Progress Notes (Signed)
CBC and BMET pre-procedure labs for Aflutter ablation per Dr Graciela Husbands.

## 2022-01-13 NOTE — Pre-Procedure Instructions (Signed)
Instructed patient on the following items: Arrival time 0530 Nothing to eat or drink after midnight No meds AM of procedure Responsible person to drive you home and stay with you for 24 hrs  Have you missed any doses of anti-coagulant Eliquis- hasn't missed any doses    

## 2022-01-16 ENCOUNTER — Ambulatory Visit (HOSPITAL_COMMUNITY)
Admission: RE | Admit: 2022-01-16 | Discharge: 2022-01-16 | Disposition: A | Discharge status: Home / Self Care | Payer: Medicare Other | Attending: Internal Medicine | Admitting: Internal Medicine

## 2022-01-16 ENCOUNTER — Encounter (HOSPITAL_COMMUNITY): Payer: Self-pay | Admitting: Internal Medicine

## 2022-01-16 ENCOUNTER — Ambulatory Visit (HOSPITAL_COMMUNITY): Payer: Medicare Other | Admitting: Certified Registered Nurse Anesthetist

## 2022-01-16 ENCOUNTER — Encounter (HOSPITAL_COMMUNITY)
Admission: RE | Disposition: A | Discharge status: Home / Self Care | Payer: Self-pay | Source: Home / Self Care | Attending: Internal Medicine

## 2022-01-16 ENCOUNTER — Other Ambulatory Visit: Payer: Self-pay

## 2022-01-16 ENCOUNTER — Ambulatory Visit (HOSPITAL_BASED_OUTPATIENT_CLINIC_OR_DEPARTMENT_OTHER): Payer: Medicare Other | Admitting: Certified Registered Nurse Anesthetist

## 2022-01-16 DIAGNOSIS — I4892 Unspecified atrial flutter: Secondary | ICD-10-CM | POA: Diagnosis not present

## 2022-01-16 DIAGNOSIS — Z6834 Body mass index (BMI) 34.0-34.9, adult: Secondary | ICD-10-CM | POA: Diagnosis not present

## 2022-01-16 DIAGNOSIS — I5030 Unspecified diastolic (congestive) heart failure: Secondary | ICD-10-CM | POA: Diagnosis not present

## 2022-01-16 DIAGNOSIS — G4730 Sleep apnea, unspecified: Secondary | ICD-10-CM | POA: Diagnosis not present

## 2022-01-16 HISTORY — PX: A-FLUTTER ABLATION: EP1230

## 2022-01-16 SURGERY — A-FLUTTER ABLATION
Anesthesia: General

## 2022-01-16 MED ORDER — SODIUM CHLORIDE 0.9% FLUSH: 3.0000 mL | Freq: Two times a day (BID) | INTRAVENOUS | Status: DC

## 2022-01-16 MED ORDER — FENTANYL CITRATE (PF) 100 MCG/2ML IJ SOLN
INTRAMUSCULAR | Status: DC | PRN
  Administered 2022-01-16: 100 ug via INTRAVENOUS

## 2022-01-16 MED ORDER — BUPIVACAINE HCL (PF) 0.25 % IJ SOLN
INTRAMUSCULAR | Status: DC | PRN
Start: 2022-01-16 — End: 2022-01-16
  Administered 2022-01-16: 45 mL

## 2022-01-16 MED ORDER — LIDOCAINE 2% (20 MG/ML) 5 ML SYRINGE
INTRAMUSCULAR | Status: DC | PRN
Start: 2022-01-16 — End: 2022-01-16
  Administered 2022-01-16: 80 mg via INTRAVENOUS

## 2022-01-16 MED ORDER — DEXAMETHASONE SODIUM PHOSPHATE 10 MG/ML IJ SOLN
INTRAMUSCULAR | Status: DC | PRN
  Administered 2022-01-16: 5 mg via INTRAVENOUS

## 2022-01-16 MED ORDER — PROPOFOL 10 MG/ML IV BOLUS
INTRAVENOUS | Status: DC | PRN
  Administered 2022-01-16: 170 mg via INTRAVENOUS

## 2022-01-16 MED ORDER — SUGAMMADEX SODIUM 200 MG/2ML IV SOLN
INTRAVENOUS | Status: DC | PRN
  Administered 2022-01-16: 300 mg via INTRAVENOUS

## 2022-01-16 MED ORDER — ONDANSETRON HCL 4 MG/2ML IJ SOLN: 4.0000 mg | Freq: Four times a day (QID) | INTRAMUSCULAR | Status: DC | PRN

## 2022-01-16 MED ORDER — SODIUM CHLORIDE 0.9% FLUSH: 3.0000 mL | INTRAVENOUS | Status: DC | PRN

## 2022-01-16 MED ORDER — PHENYLEPHRINE HCL-NACL 20-0.9 MG/250ML-% IV SOLN
INTRAVENOUS | Status: DC | PRN
  Administered 2022-01-16: 25 ug/min via INTRAVENOUS

## 2022-01-16 MED ORDER — BUPIVACAINE HCL (PF) 0.25 % IJ SOLN
INTRAMUSCULAR | Status: AC
  Filled 2022-01-16: qty 30

## 2022-01-16 MED ORDER — SODIUM CHLORIDE 0.9 % IV SOLN: 250.0000 mL | INTRAVENOUS | Status: DC | PRN

## 2022-01-16 MED ORDER — HEPARIN (PORCINE) IN NACL 1000-0.9 UT/500ML-% IV SOLN
INTRAVENOUS | Status: AC
  Filled 2022-01-16: qty 500

## 2022-01-16 MED ORDER — ACETAMINOPHEN 325 MG PO TABS
650.0000 mg | ORAL_TABLET | ORAL | Status: DC | PRN
  Filled 2022-01-16: qty 2

## 2022-01-16 MED ORDER — MIDAZOLAM HCL 5 MG/5ML IJ SOLN
INTRAMUSCULAR | Status: DC | PRN
  Administered 2022-01-16: 2 mg via INTRAVENOUS

## 2022-01-16 MED ORDER — ROCURONIUM BROMIDE 10 MG/ML (PF) SYRINGE
PREFILLED_SYRINGE | INTRAVENOUS | Status: DC | PRN
  Administered 2022-01-16: 60 mg via INTRAVENOUS
  Administered 2022-01-16 (×2): 20 mg via INTRAVENOUS

## 2022-01-16 MED ORDER — FUROSEMIDE 20 MG PO TABS: 20.0000 mg | ORAL_TABLET | ORAL | 0 refills | Status: DC

## 2022-01-16 MED ORDER — SODIUM CHLORIDE 0.9 % IV SOLN: INTRAVENOUS | Status: DC

## 2022-01-16 MED ORDER — HEPARIN (PORCINE) IN NACL 1000-0.9 UT/500ML-% IV SOLN
INTRAVENOUS | Status: DC | PRN
  Administered 2022-01-16 (×2): 500 mL

## 2022-01-16 MED ORDER — ONDANSETRON HCL 4 MG/2ML IJ SOLN
INTRAMUSCULAR | Status: DC | PRN
Start: 2022-01-16 — End: 2022-01-16
  Administered 2022-01-16: 4 mg via INTRAVENOUS

## 2022-01-16 SURGICAL SUPPLY — 12 items
CATH BLAZERPRIME XP LG CV 10MM (ABLATOR) ×1 IMPLANT
CATH DUODECA HALO/ISMUS 7FR (CATHETERS) ×1 IMPLANT
CATH OCTAPOLOR 6F 125CM 2-5-2 (CATHETERS) ×1 IMPLANT
CATH QUAD COURNAND 5FR REPROC (CATHETERS) ×1 IMPLANT
MAT PREVALON FULL STRYKER (MISCELLANEOUS) ×1 IMPLANT
PACK EP LATEX FREE (CUSTOM PROCEDURE TRAY) ×2
PACK EP LF (CUSTOM PROCEDURE TRAY) ×1 IMPLANT
PAD DEFIB RADIO PHYSIO CONN (PAD) ×2 IMPLANT
SHEATH ATRIAL FLUTTER SAFL 8F (SHEATH) ×1 IMPLANT
SHEATH PINNACLE 6F 10CM (SHEATH) ×1 IMPLANT
SHEATH PINNACLE 7F 10CM (SHEATH) ×1 IMPLANT
SHEATH PINNACLE 8F 10CM (SHEATH) ×2 IMPLANT

## 2022-01-16 NOTE — Progress Notes (Signed)
Report received from Anesthesia.  Richard Vazquez

## 2022-01-16 NOTE — Progress Notes (Signed)
Venous Sheath Removal  Left venous sheaths removed. Manual pressure held for 10 min. VVS, dressing applied. Patient educated. Clean, Dry, Intact, no hematoma

## 2022-01-16 NOTE — Anesthesia Preprocedure Evaluation (Signed)
Anesthesia Evaluation  Patient identified by MRN, date of birth, ID band Patient awake    Reviewed: Allergy & Precautions, NPO status , Patient's Chart, lab work & pertinent test results  History of Anesthesia Complications Negative for: history of anesthetic complications  Airway Mallampati: IV  TM Distance: <3 FB Neck ROM: Full    Dental  (+) Teeth Intact, Dental Advisory Given,    Pulmonary neg shortness of breath, neg COPD, neg recent URI, former smoker,    breath sounds clear to auscultation       Cardiovascular (-) angina+ CAD  (-) Past MI and (-) CHF  Rhythm:Regular  1. Left ventricular ejection fraction, by estimation, is 65 to 70%. The  left ventricle has hyperdynamic function. The left ventricle has no  regional wall motion abnormalities. There is mild concentric left  ventricular hypertrophy. Left ventricular  diastolic function could not be evaluated.  2. Right ventricular systolic function was not well visualized. The right  ventricular size is normal. There is normal pulmonary artery systolic  pressure.  3. The mitral valve is normal in structure. Mild mitral valve  regurgitation. No evidence of mitral stenosis.  4. The aortic valve is normal in structure. Aortic valve regurgitation is  not visualized. No aortic stenosis is present.  5. The inferior vena cava is dilated in size with >50% respiratory  variability, suggesting right atrial pressure of 8 mmHg.   .  LV end diastolic pressure is normal.  1.  Mild obstructive coronary artery disease. 2.  LVEDP of 13 mmHg.  Recommendation: Medical therapy.    Neuro/Psych negative neurological ROS  negative psych ROS   GI/Hepatic negative GI ROS, Neg liver ROS,   Endo/Other  negative endocrine ROS  Renal/GU Renal disease     Musculoskeletal negative musculoskeletal ROS (+)   Abdominal   Peds  Hematology  (+) Blood dyscrasia, , eliquis  Lab  Results      Component                Value               Date                      WBC                      9.8                 01/13/2022                HGB                      13.9                01/13/2022                HCT                      41.0                01/13/2022                MCV                      87                  01/13/2022  PLT                      254                 01/13/2022              Anesthesia Other Findings   Reproductive/Obstetrics                             Anesthesia Physical Anesthesia Plan  ASA: 2  Anesthesia Plan: General   Post-op Pain Management: Minimal or no pain anticipated   Induction: Intravenous  PONV Risk Score and Plan: 2 and Ondansetron and Dexamethasone  Airway Management Planned: Oral ETT and Video Laryngoscope Planned  Additional Equipment: None  Intra-op Plan:   Post-operative Plan: Extubation in OR  Informed Consent: I have reviewed the patients History and Physical, chart, labs and discussed the procedure including the risks, benefits and alternatives for the proposed anesthesia with the patient or authorized representative who has indicated his/her understanding and acceptance.     Dental advisory given  Plan Discussed with: CRNA  Anesthesia Plan Comments:         Anesthesia Quick Evaluation

## 2022-01-16 NOTE — Discharge Instructions (Addendum)
Post procedure care instructions No driving for 4 days. No lifting over 5 lbs for 1 week. No vigorous or sexual activity for 1 week. You may return to work/your usual activities on 01/24/22. Keep procedure site clean & dry. If you notice increased pain, swelling, bleeding or pus, call/return!  You may shower after 24 hours, but no soaking in baths/hot tubs/pools for 1 week.   Cardiac Ablation, Care After  This sheet gives you information about how to care for yourself after your procedure. Your health care provider may also give you more specific instructions. If you have problems or questions, contact your health care provider. What can I expect after the procedure? After the procedure, it is common to have: Bruising around your puncture site. Tenderness around your puncture site. Skipped heartbeats. Tiredness (fatigue).  Follow these instructions at home: Puncture site care  Follow instructions from your health care provider about how to take care of your puncture site. Make sure you: If present, leave stitches (sutures), skin glue, or adhesive strips in place. These skin closures may need to stay in place for up to 2 weeks. If adhesive strip edges start to loosen and curl up, you may trim the loose edges. Do not remove adhesive strips completely unless your health care provider tells you to do that. If a large square bandage is present, this may be removed 24 hours after surgery.  Check your puncture site every day for signs of infection. Check for: Redness, swelling, or pain. Fluid or blood. If your puncture site starts to bleed, lie down on your back, apply firm pressure to the area, and contact your health care provider. Warmth. Pus or a bad smell. A pea or small marble sized lump at the site is normal and can take up to three months to resolve.  Driving Do not drive for at least 4 days after your procedure or however long your health care provider recommends. (Do not resume driving if  you have previously been instructed not to drive for other health reasons.) Do not drive or use heavy machinery while taking prescription pain medicine. Activity Avoid activities that take a lot of effort for at least 7 days after your procedure. Do not lift anything that is heavier than 5 lb (4.5 kg) for one week.  No sexual activity for 1 week.  Return to your normal activities as told by your health care provider. Ask your health care provider what activities are safe for you. General instructions Take over-the-counter and prescription medicines only as told by your health care provider. Do not use any products that contain nicotine or tobacco, such as cigarettes and e-cigarettes. If you need help quitting, ask your health care provider. You may shower after 24 hours, but Do not take baths, swim, or use a hot tub for 1 week.  Do not drink alcohol for 24 hours after your procedure. Keep all follow-up visits as told by your health care provider. This is important. Contact a health care provider if: You have redness, mild swelling, or pain around your puncture site. You have fluid or blood coming from your puncture site that stops after applying firm pressure to the area. Your puncture site feels warm to the touch. You have pus or a bad smell coming from your puncture site. You have a fever. You have chest pain or discomfort that spreads to your neck, jaw, or arm. You are sweating a lot. You feel nauseous. You have a fast or irregular heartbeat. You  have shortness of breath. You are dizzy or light-headed and feel the need to lie down. You have pain or numbness in the arm or leg closest to your puncture site. Get help right away if: Your puncture site suddenly swells. Your puncture site is bleeding and the bleeding does not stop after applying firm pressure to the area. These symptoms may represent a serious problem that is an emergency. Do not wait to see if the symptoms will go away.  Get medical help right away. Call your local emergency services (911 in the [place] Do not drive yourself to the hospital. Summary After the procedure, it is normal to have bruising and tenderness at the puncture site in your groin, neck, or forearm. Check your puncture site every day for signs of infection. Get help right away if your puncture site is bleeding and the bleeding does not stop after applying firm pressure to the area. This is a medical emergency. This information is not intended to replace advice given to you by your health care provider. Make sure you discuss any questions you have with your health care provider.

## 2022-01-16 NOTE — Anesthesia Procedure Notes (Signed)
Procedure Name: Intubation Date/Time: 01/16/2022 7:58 AM Performed by: Colin Benton, CRNA Pre-anesthesia Checklist: Patient identified, Emergency Drugs available, Suction available and Patient being monitored Patient Re-evaluated:Patient Re-evaluated prior to induction Oxygen Delivery Method: Circle system utilized Preoxygenation: Pre-oxygenation with 100% oxygen Induction Type: IV induction Ventilation: Mask ventilation without difficulty and Oral airway inserted - appropriate to patient size Laryngoscope Size: Mac and 4 Grade View: Grade II Tube type: Oral Tube size: 7.5 mm Number of attempts: 1 Airway Equipment and Method: Stylet and Oral airway Placement Confirmation: ETT inserted through vocal cords under direct vision, positive ETCO2 and breath sounds checked- equal and bilateral Secured at: 23 cm Tube secured with: Tape Dental Injury: Teeth and Oropharynx as per pre-operative assessment

## 2022-01-16 NOTE — Transfer of Care (Signed)
Immediate Anesthesia Transfer of Care Note  Patient: Richard Vazquez  Procedure(s) Performed: A-FLUTTER ABLATION  Patient Location: cath lab recovery  Anesthesia Type:General  Level of Consciousness: awake, alert , oriented and patient cooperative  Airway & Oxygen Therapy: Patient Spontanous Breathing and Patient connected to nasal cannula oxygen  Post-op Assessment: Report given to RN, Post -op Vital signs reviewed and stable and Patient moving all extremities X 4  Post vital signs: Reviewed and stable  Last Vitals:  Vitals Value Taken Time  BP 123/77 01/16/22 0952  Temp    Pulse 69 01/16/22 0954  Resp 15 01/16/22 0954  SpO2 93 % 01/16/22 0954  Vitals shown include unvalidated device data.  Last Pain:  Vitals:   01/16/22 0608  TempSrc:   PainSc: 0-No pain         Complications: No notable events documented.

## 2022-01-16 NOTE — Progress Notes (Signed)
Venous Sheath Removal  Right venous sheaths removed. Manual pressure held for 10 min. VVS, dressing applied. Patient educated. Clean ,Dry, intact, no hematoma.  Bed Rest Started 1045  Malva Limes RN

## 2022-01-16 NOTE — H&P (Signed)
Patient Care Team: Bonnita Hollow, MD as PCP - General (Family Medicine) Fay Records, MD as PCP - Cardiology (Cardiology) Sharmon Revere as Physician Assistant (Cardiology)   HPI  Richard Vazquez is a 66 y.o. male  admitted for catheter ablation fo atrial flutter  Initially presented to [facility] 10/22 found to be in atrial flutter.  He underwent TEE guided cardioversion started on anticoagulation.  Recurrent atrial flutter 1/23 with palpitations and some chest pain and shortness of breath.  Again cardioverted with subsequent reversion to atrial flutter   Symptoms with atrial flutter include fatigue, lightheadedness and dyspnea.   Recurrent atrial flutterr 4/23>>  apparently per PR report on 5/19 NSR   Sleep disorder breathing  sleep testing pending  The patient denies any changes   chest pain, shortness of breath, nocturnal dyspnea, orthopnea or peripheral edema.  There have been no palpitations, lightheadedness or syncope.       DATE TEST EF    10/22 Echo   >75% % Hyperdynamic with mild LVH   4/23 LHC hyperdynamic No obstructive CAD             Date Cr K Hgb  3/23 1.03 4.0 14.8   5/23  1.00 3.8 13.9     Records and Results Reviewed   Past Medical History:  Diagnosis Date   Carotid artery disease ([place])    Carotid US 12/22: R 40-59; L 1-39   Former smoker 12/21/2015   Hyperlipemia    Kidney stones    Left carotid bruit 08/05/2021   Obesity    Paroxysmal atrial flutter ([place]) 05/29/2021    Past Surgical History:  Procedure Laterality Date   APPENDECTOMY     CARDIOVERSION N/A 05/31/2021   Procedure: CARDIOVERSION;  Surgeon: [name], MD;  Location: [facility];  Service: Cardiovascular;  Laterality: N/A;   CARDIOVERSION N/A 10/05/2021   Procedure: CARDIOVERSION;  Surgeon: [name], MD;  Location: [facility];  Service: Cardiovascular;  Laterality: N/A;   LEFT HEART CATH AND CORONARY ANGIOGRAPHY N/A 12/07/2021   Procedure: LEFT HEART CATH AND  CORONARY ANGIOGRAPHY;  Surgeon: [name], MD;  Location: [facility];  Service: Cardiovascular;  Laterality: N/A;   TEE WITHOUT CARDIOVERSION N/A 05/31/2021   Procedure: TRANSESOPHAGEAL ECHOCARDIOGRAM (TEE);  Surgeon: [name], MD;  Location: [facility] ENDOSCOPY;  Service: Cardiovascular;  Laterality: N/A;    Current Facility-Administered Medications  Medication Dose Route Frequency Provider Last Rate Last Admin   0.9 %  sodium chloride infusion   Intravenous Continuous Deboraha Sprang, MD 50 mL/hr at 01/16/22 [id number] New Bag at 01/16/22 [id number]    Allergies  Allergen Reactions   Lipitor [Atorvastatin] Other (See Comments)    Myalgias   Codeine Itching   Latex Itching      Social History   Tobacco Use   Smoking status: Former    Packs/day: 1.50    Years: 40.00    Pack years: 60.00    Types: Cigarettes    Quit date: 12/09/2019    Years since quitting: 2.1    Passive exposure: Past   Smokeless tobacco: Never  Vaping Use   Vaping Use: Never used  Substance Use Topics   Alcohol use: Not Currently   Drug use: Never     History reviewed. No pertinent family history.   Current Meds  Medication Sig   acetaminophen (TYLENOL) 325 MG tablet Take 325-650 mg by mouth every 6 (six) hours as  needed for headache.   apixaban (ELIQUIS) 5 MG TABS tablet Take 1 tablet (5 mg total) by mouth 2 (two) times daily.   diltiazem (CARDIZEM CD) 180 MG 24 hr capsule Take 1 capsule (180 mg total) by mouth daily.   indapamide (LOZOL) 2.5 MG tablet Take 2.5 mg by mouth daily.   Iron, Ferrous Sulfate, 325 (65 Fe) MG TABS Take 325 mg by mouth every other day.   METAMUCIL FIBER PO Take 1 Scoop by mouth daily.   metoprolol tartrate (LOPRESSOR) 25 MG tablet Take 1 tablet (25 mg total) by mouth 2 (two) times daily.   Multiple Vitamin (MULTIVITAMIN) tablet Take 1 tablet by mouth daily.   nitroGLYCERIN (NITROSTAT) 0.4 MG SL tablet Place 1 tablet (0.4 mg total) under the tongue every 5 (five) minutes  as needed for chest pain (Max 3 doses).   Omega-3 1000 MG CAPS Take 1,000 mg by mouth daily.   rosuvastatin (CRESTOR) 20 MG tablet Take 1 tablet (20 mg total) by mouth daily.   vitamin B-12 (CYANOCOBALAMIN) 500 MCG tablet Take 500 mcg by mouth daily.     Review of Systems negative except from HPI and PMH  Physical Exam BP 126/70   Pulse 64   Temp 98.2 F (36.8 C) (Oral)   Resp 17   Ht 6\' 3"  (1.905 m)   Wt 123.8 kg   SpO2 96%   BMI 34.12 kg/m  Well developed and well nourished in no acute distress HENT normal E scleral and icterus clear Neck Supple JVP flat; carotids brisk and full Clear to ausculation Regular rate and rhythm, no murmurs gallops or rub Soft with active bowel sounds No clubbing cyanosis + Edema Alert and oriented, grossly normal motor and sensory function Skin Warm and Dry  ECG NSR per report on Friday 5/19   Assessment and  Plan   Atrial flutter-recurrent-typical   Morbid obesity    Sleep disordered breathing   Aortic dilitation - infrarenal  3 year followup recommended 2026  HFpEF   For catheter ablation, reviewed risks and benefits, inclu bleeding , failure and post procedure Afib  Volume overloaded so will be more aggressive with post procedure diuretics

## 2022-01-17 NOTE — Anesthesia Postprocedure Evaluation (Signed)
Anesthesia Post Note  Patient: Richard Vazquez  Procedure(s) Performed: A-FLUTTER ABLATION     Patient location during evaluation: Cath Lab Anesthesia Type: General Level of consciousness: awake and alert Pain management: pain level controlled Vital Signs Assessment: post-procedure vital signs reviewed and stable Respiratory status: spontaneous breathing, nonlabored ventilation and respiratory function stable Cardiovascular status: blood pressure returned to baseline and stable Postop Assessment: no apparent nausea or vomiting Anesthetic complications: no   No notable events documented.  Last Vitals:  Vitals:   01/16/22 1500 01/16/22 1600  BP: 121/72 137/82  Pulse: 76 82  Resp: (!) 21 (!) 33  Temp:    SpO2: 95% 94%    Last Pain:  Vitals:   01/17/22 1402  TempSrc:   PainSc: 0-No pain                 Ahsley Attwood

## 2022-01-19 ENCOUNTER — Other Ambulatory Visit: Payer: Medicare Other

## 2022-01-19 ENCOUNTER — Telehealth: Payer: Self-pay | Admitting: Internal Medicine

## 2022-01-19 ENCOUNTER — Telehealth: Payer: Self-pay | Admitting: Family Medicine

## 2022-01-19 ENCOUNTER — Ambulatory Visit (INDEPENDENT_AMBULATORY_CARE_PROVIDER_SITE_OTHER): Payer: Medicare Other | Admitting: Nurse Practitioner

## 2022-01-19 ENCOUNTER — Encounter: Payer: Self-pay | Admitting: Nurse Practitioner

## 2022-01-19 VITALS — BP 128/76 | HR 74 | Temp 96.8°F | Wt 281.0 lb

## 2022-01-19 DIAGNOSIS — J069 Acute upper respiratory infection, unspecified: Secondary | ICD-10-CM | POA: Diagnosis not present

## 2022-01-19 LAB — POCT RAPID STREP A (OFFICE): Rapid Strep A Screen: NEGATIVE

## 2022-01-19 LAB — POC COVID19 BINAXNOW: SARS Coronavirus 2 Ag: NEGATIVE

## 2022-01-19 NOTE — Progress Notes (Signed)
Acute Office Visit  Subjective:     Patient ID: Richard Vazquez, male    DOB: 10/11/1955, 66 y.o.   MRN: [id number]  Chief Complaint  Patient presents with   Sinusitis    Pt c/o nasal/head congestion w/ low grade fever that has subsided since the am x1 day    HPI Patient is in today for congestion, fever, and cough since yesterday.   UPPER RESPIRATORY TRACT INFECTION  Fever: yes  Cough: yes Shortness of breath: no Wheezing: no Chest pain: no Chest tightness: no Chest congestion: no Nasal congestion: yes Runny nose: yes Post nasal drip: no Sneezing: yes Sore throat: no Swollen glands: no Sinus pressure: no Headache: no Face pain: no Toothache: no Ear pain: no bilateral Ear pressure: no bilateral Eyes red/itching:yes Eye drainage/crusting: no  Vomiting: no Rash: no Fatigue: no Sick contacts: no Strep contacts: no  Context: stable Recurrent sinusitis: no Relief with OTC cold/cough medications: no  Treatments attempted: vicks rub, tylenol, green tea  ROS See pertinent positives and negatives per HPI.     Objective:    BP 128/76   Pulse 74   Temp (!) 96.8 F (36 C) (Temporal)   Wt 281 lb (127.5 kg)   SpO2 95%   BMI 35.12 kg/m  BP Readings from Last 3 Encounters:  01/19/22 128/76  01/16/22 137/82  01/13/22 (!) 142/66   Wt Readings from Last 3 Encounters:  01/19/22 281 lb (127.5 kg)  01/16/22 273 lb (123.8 kg)  01/13/22 274 lb (124.3 kg)      Physical Exam Vitals and nursing note reviewed.  Constitutional:      Appearance: Normal appearance.  HENT:     Head: Normocephalic.     Right Ear: Tympanic membrane, ear canal and external ear normal.     Left Ear: Tympanic membrane, ear canal and external ear normal.     Nose:     Right Sinus: No maxillary sinus tenderness or frontal sinus tenderness.     Left Sinus: No maxillary sinus tenderness or frontal sinus tenderness.     Mouth/Throat:     Mouth: Mucous membranes are moist.     Pharynx:  Posterior oropharyngeal erythema present. No oropharyngeal exudate.  Eyes:     Conjunctiva/sclera: Conjunctivae normal.  Cardiovascular:     Rate and Rhythm: Normal rate and regular rhythm.     Pulses: Normal pulses.     Heart sounds: Normal heart sounds.  Pulmonary:     Effort: Pulmonary effort is normal.     Breath sounds: Normal breath sounds.  Musculoskeletal:     Cervical back: Normal range of motion. No tenderness.  Lymphadenopathy:     Cervical: No cervical adenopathy.  Skin:    General: Skin is warm.  Neurological:     General: No focal deficit present.     Mental Status: He is alert and oriented to person, place, and time.  Psychiatric:        Mood and Affect: Mood normal.        Behavior: Behavior normal.        Thought Content: Thought content normal.        Judgment: Judgment normal.    Results for orders placed or performed in visit on 01/19/22  POC COVID-19 BinaxNow  Result Value Ref Range   SARS Coronavirus 2 Ag Negative Negative  POCT rapid strep A  Result Value Ref Range   Rapid Strep A Screen Negative Negative  Assessment & Plan:   Problem List Items Addressed This Visit   None Visit Diagnoses     Upper respiratory tract infection, unspecified type    -  Primary   POC strep and covid-19 negative. most likely viral. Discussed OTC med he can take, including flonase and coricidin HBP. F/U if symptoms worsen.    Relevant Orders   POC COVID-19 BinaxNow (Completed)   POCT rapid strep A (Completed)       No orders of the defined types were placed in this encounter.   Return if symptoms worsen or fail to improve.  Gerre Scull, NP

## 2022-01-19 NOTE — Patient Instructions (Signed)
It was great to see you!  Start flonase nasal spray once a day. You can also start Coricidin HBP for your symptoms as well. Do not take anything with phenylephrine HCl.   Let's follow-up if your symptoms worsen or don't improve  Take care,  Rodman Pickle, NP

## 2022-01-19 NOTE — Telephone Encounter (Signed)
Pt called asking advise on message above. He said his fever had broken but still feeling bad. Should he make an appt or can you prescribe something? Let me know and I can call and schedule if necessary.

## 2022-01-19 NOTE — Telephone Encounter (Signed)
Spoke with pt who reports he has developed cough, sinus congestion and chills.  Dr Graciela Husbands completed pt's Aflutter ablation on 01/16/2022.  Pt has contacted his PCP office and is waiting to hear back from them re: an appointment.  Pt thanked Charity fundraiser for the call back.

## 2022-01-19 NOTE — Telephone Encounter (Signed)
Patient is calling stating he woke up with chills and a fever. Took two tylenol and chills went away. Patient reports he is also having symptoms of chest and sinus congestion that has been going on since yesterday. Requesting a callback for any recommendations. Patient asked if an appointment could be scheduled to be seen regarding this advised him we are unable to see him with an active fever. Patient is going to call his PCP regarding an appt.

## 2022-01-21 ENCOUNTER — Encounter: Payer: Self-pay | Admitting: Internal Medicine

## 2022-01-21 NOTE — Progress Notes (Signed)
Caled to follwoup from procedure Some volume overload   increase furosemide from 20 qod to 40 qd x 3 days then resume

## 2022-01-30 DIAGNOSIS — N200 Calculus of kidney: Secondary | ICD-10-CM | POA: Diagnosis not present

## 2022-01-30 DIAGNOSIS — N400 Enlarged prostate without lower urinary tract symptoms: Secondary | ICD-10-CM | POA: Diagnosis not present

## 2022-02-01 ENCOUNTER — Ambulatory Visit (HOSPITAL_BASED_OUTPATIENT_CLINIC_OR_DEPARTMENT_OTHER): Payer: Medicare Other | Attending: Cardiology | Admitting: Cardiology

## 2022-02-01 VITALS — Ht 75.0 in | Wt 272.0 lb

## 2022-02-01 DIAGNOSIS — G4733 Obstructive sleep apnea (adult) (pediatric): Secondary | ICD-10-CM | POA: Diagnosis not present

## 2022-02-01 DIAGNOSIS — G4736 Sleep related hypoventilation in conditions classified elsewhere: Secondary | ICD-10-CM | POA: Insufficient documentation

## 2022-02-01 DIAGNOSIS — I4891 Unspecified atrial fibrillation: Secondary | ICD-10-CM | POA: Diagnosis not present

## 2022-02-01 NOTE — Procedures (Signed)
   Patient Name: Richard Vazquez, Richard Vazquez Date: 02/01/2022 Gender: Male D.O.B: [birth year] Age (years): 66 Referring Provider: Dorris Carnes Height (inches): 75 Interpreting Physician: Fransico Him MD, ABSM Weight (lbs): 272 RPSGT: Jacolyn Reedy BMI: 34 MRN: [id number] Neck Size: 18.00  CLINICAL INFORMATION The patient is referred for a BiPAP titration to treat sleep apnea.  SLEEP STUDY TECHNIQUE As per the AASM Manual for the Scoring of Sleep and Associated Events v2.3 (April 2016) with a hypopnea requiring 4% desaturations.  The channels recorded and monitored were frontal, central and occipital EEG, electrooculogram (EOG), submentalis EMG (chin), nasal and oral airflow, thoracic and abdominal wall motion, anterior tibialis EMG, snore microphone, electrocardiogram, and pulse oximetry. Bilevel positive airway pressure (BPAP) was initiated at the beginning of the study and titrated to treat sleep-disordered breathing.  MEDICATIONS Medications self-administered by patient taken the night of the study : N/A  RESPIRATORY PARAMETERS Optimal IPAP Pressure (cm): 11  AHI at Optimal Pressure (/hr) 0.8 Optimal EPAP Pressure (cm):7  Overall Minimal O2 (%):83.0 Minimal O2 at Optimal Pressure (%): 87.0  SLEEP ARCHITECTURE Start Time:8:12:23 AM  Stop Time:2:33:36 PM  Total Time (min):381.2  Total Sleep Time (min):298 Sleep Latency (min): 12.4  Sleep Efficiency (%):78.2%  REM Latency (min):86.5  WASO (min):70.8 Stage N1 (%): 10.7%  Stage N2 (%): 53.2%  Stage N3 (%): 0.0%  Stage R (%):36.1 Supine (%):3.86  Arousal Index (/hr):13.9   CARDIAC DATA The 2 lead EKG demonstrated atrial fibrillation. The mean heart rate was 78.3 beats per minute. Other EKG findings include: None.  LEG MOVEMENT DATA The total Periodic Limb Movements of Sleep (PLMS) were 0. The PLMS index was 0.0. A PLMS index of <15 is considered normal in adults.  IMPRESSIONS - An optimal PAP pressure was selected for this  patient ( 11/7 cm of water) - Central sleep apnea was not noted during this titration (CAI = 3.6/h). - Moderete oxygen desaturations were observed during this titration (min O2 = 83.0%). - The patient snored with moderate snoring volume. - Atrial fibrillation was observed during this study. - Clinically significant periodic limb movements were not noted during this study. Arousals associated with PLMs were rare.  DIAGNOSIS - Obstructive Sleep Apnea (G47.33) - Nocturnal Hypoxemia - Atrial Fibrillation  RECOMMENDATIONS - Trial of BiPAP therapy on 11/7 cm H2O with a Small-Medium size [employer] Full Face Evora Full mask and heated humidification. - Avoid alcohol, sedatives and other CNS depressants that may worsen sleep apnea and disrupt normal sleep architecture. - Sleep hygiene should be reviewed to assess factors that may improve sleep quality. - Weight management and regular exercise should be initiated or continued. - Return to Sleep Center for re-evaluation after 6 weeks of therapy  [Electronically signed] 02/01/2022 04:58 PM  Fransico Him MD, ABSM Diplomate, American Board of Sleep Medicine

## 2022-02-02 ENCOUNTER — Ambulatory Visit: Payer: Medicare Other | Admitting: Nurse Practitioner

## 2022-02-02 ENCOUNTER — Encounter: Payer: Self-pay | Admitting: Nurse Practitioner

## 2022-02-02 ENCOUNTER — Other Ambulatory Visit: Payer: Self-pay

## 2022-02-02 VITALS — BP 112/72 | HR 109 | Ht 75.0 in | Wt 273.2 lb

## 2022-02-02 DIAGNOSIS — E785 Hyperlipidemia, unspecified: Secondary | ICD-10-CM

## 2022-02-02 DIAGNOSIS — I4892 Unspecified atrial flutter: Secondary | ICD-10-CM | POA: Diagnosis not present

## 2022-02-02 DIAGNOSIS — G4733 Obstructive sleep apnea (adult) (pediatric): Secondary | ICD-10-CM

## 2022-02-02 DIAGNOSIS — I4819 Other persistent atrial fibrillation: Secondary | ICD-10-CM

## 2022-02-02 DIAGNOSIS — Z7901 Long term (current) use of anticoagulants: Secondary | ICD-10-CM

## 2022-02-02 DIAGNOSIS — I714 Abdominal aortic aneurysm, without rupture, unspecified: Secondary | ICD-10-CM

## 2022-02-02 MED ORDER — FLECAINIDE ACETATE 100 MG PO TABS: 100.0000 mg | ORAL_TABLET | Freq: Two times a day (BID) | ORAL | 3 refills | Status: DC

## 2022-02-02 NOTE — Progress Notes (Signed)
Cardiology Office Note:    Date:  02/03/2022   ID:  Richard [facility], DOB [birth year], MRN [id number]  PCP:  Richard Hollow, MD   [facility] Providers Cardiologist:  Richard Carnes, MD Cardiology APP:  [facility]     Referring MD: Richard Hollow, MD   Chief Complaint: Vazquez follow-up atrial flutter ablation  History of Present Illness:    Richard Vazquez is a very pleasant 66 y.o. male with a hx of non-obstructive CAD, hypertension, OSA, obesity, hyperlipidemia, atrial flutter  He was diagnosed with atrial flutter 05/2021 and underwent cardioversion x2 with recurrent atrial flutter.  Hospitalization with chest pain, cardiac catheterization 12/07/2021 showed mild nonobstructive disease.  He was in atrial flutter with some fast rates, referred for ablation.  He underwent a flutter ablation on 01/16/22 by Dr. Caryl Vazquez. Today, he returns for follow-up. He reports started wearing an Apple watch 6/4, has had paroxysmal atrial fib/flutter since that time. He is able to show me results on his phone. Readings appear to show atrial flutter. Had sleep study 6/7 - awaiting final results. He has felt well, mild fatigue "feels like I could take a nap." No dyspnea, orthopnea, PND. Mild lower extremity edema bilaterally. No palpitations, chest pain, presyncope, or syncope. No concern for bleeding problems. Works night shift doing sorting/production work.   Past Medical History:  Diagnosis Date   Carotid artery disease ([place])    Carotid US 12/22: R 40-59; L 1-39   Former smoker 12/21/2015   Hyperlipemia    Kidney stones    Left carotid bruit 08/05/2021   Obesity    Paroxysmal atrial flutter ([place]) 05/29/2021    Past Surgical History:  Procedure Laterality Date   A-FLUTTER ABLATION N/A 01/16/2022   Procedure: A-FLUTTER ABLATION;  Surgeon: Richard Sprang, MD;  Location: [place] [facility];  Service: Cardiovascular;  Laterality: N/A;   APPENDECTOMY     CARDIOVERSION N/A 05/31/2021   Procedure:  CARDIOVERSION;  Surgeon: Richard Casino, MD;  [facility];  Service: Cardiovascular;  Laterality: N/A;   CARDIOVERSION N/A 10/05/2021   Procedure: CARDIOVERSION;  Surgeon: Richard Casino, MD;  Location: [place];  Service: Cardiovascular;  Laterality: N/A;   LEFT HEART CATH AND CORONARY ANGIOGRAPHY N/A 12/07/2021   Procedure: LEFT HEART CATH AND CORONARY ANGIOGRAPHY;  Surgeon: Richard Osmond, MD;  Location: [place] [facility];  Service: Cardiovascular;  Laterality: N/A;   TEE WITHOUT CARDIOVERSION N/A 05/31/2021   Procedure: TRANSESOPHAGEAL ECHOCARDIOGRAM (TEE);  Surgeon: Richard Casino, MD;  Location: [facility];  Service: Cardiovascular;  Laterality: N/A;    Current Medications: Current Meds  Medication Sig   acetaminophen (TYLENOL) 325 MG tablet Take 325-650 mg by mouth every 6 (six) hours as needed for headache.   apixaban (ELIQUIS) 5 MG TABS tablet Take 1 tablet (5 mg total) by mouth 2 (two) times daily.   diltiazem (CARDIZEM CD) 180 MG 24 hr capsule Take 1 capsule (180 mg total) by mouth daily.   flecainide (TAMBOCOR) 100 MG tablet Take 1 tablet (100 mg total) by mouth 2 (two) times daily.   indapamide (LOZOL) 2.5 MG tablet Take 2.5 mg by mouth daily.   Iron, Ferrous Sulfate, 325 (65 Fe) MG TABS Take 325 mg by mouth every other day.   METAMUCIL FIBER PO Take 1 Scoop by mouth daily.   Multiple Vitamin (MULTIVITAMIN) tablet Take 1 tablet by mouth daily.   nitroGLYCERIN (NITROSTAT) 0.4 MG SL tablet Place 1 tablet (0.4 mg total)  under the tongue every 5 (five) minutes as needed for chest pain (Max 3 doses).   Omega-3 1000 MG CAPS Take 1,000 mg by mouth daily.   rosuvastatin (CRESTOR) 20 MG tablet Take 1 tablet (20 mg total) by mouth daily.   vitamin B-12 (CYANOCOBALAMIN) 500 MCG tablet Take 500 mcg by mouth daily.     Allergies:   Lipitor [atorvastatin], Codeine, and Latex   Social History   Socioeconomic History   Marital status: Married    Spouse name: Not on  file   Number of children: Not on file   Years of education: Not on file   Highest education level: Not on file  Occupational History   Not on file  Tobacco Use   Smoking status: Former    Packs/day: 1.50    Years: 40.00    Total pack years: 60.00    Types: Cigarettes    Quit date: 12/09/2019    Years since quitting: 2.1    Passive exposure: Past   Smokeless tobacco: Never  Vaping Use   Vaping Use: Never used  Substance and Sexual Activity   Alcohol use: Not Currently   Drug use: Never   Sexual activity: Not Currently  Other Topics Concern   Not on file  Social History Narrative   Not on file   Social Determinants of Health   Financial Resource Strain: Not on file  Food Insecurity: Not on file  Transportation Needs: Not on file  Physical Activity: Not on file  Stress: Not on file  Social Connections: Not on file     Family History: The patient's family history is not on file.  ROS:   Please see the history of present illness.    + fatigue   All other systems reviewed and are negative.  Labs/Other Studies Reviewed:    The following studies were reviewed today:  A-flutter ablation 01/16/22 DetailsELECTROPHYSIOLOGY OPERATIVE REPORT   Preoperative diagnosis: Atrial flutter Postoperative diagnosis: Same  Procedure: Invasive electro-physiological study; coronary sinus mapping: Arrhythmia mapping: Catheter ablation  Complications: none apparent  Following the obtaining of informed consent the patient was brought to electrophysiology laboratory placed on the fluoroscopic table in the supine position.  After routine prep and drape,  the patient was submitted to general anesthesia  Following the procedure the catheters were removed hemostasis was obtained and the patient is transferred to the holding area in stable condition.  Catheters A 5 French quadripolar catheter to the AV junction to measure HIS electrogram 6 French octapolar catheter via the right femoral  vein into the coronary sinus 7 [place] doadecapolar catheter via the left femoral vein to the tricuspid annulus 8 French 10 mm deflectable tip catheter in the right femoral vein to the caval tricuspid isthmus  Following insertion of the catheters of the stimulation protocol included Incremental atrial pacing Incremental ventricular pacing Single atrial extrastimuli Coronary sinus pacing.  Surface electro-cardiogram and basic intervals  INITIAL : Rhythm sinus; RR cycle length 804; PR interval 228; P wave duration 136, QRS duration 108, QT interval 408   AH interval 124, HV interval 45  FINAL: Rhythm sinus; RR cycle length 972; PR interval 220; P wave duration 130, QRS duration 104, QT interval 436. AH interval 119, HV interval 48   AV nodal function AV Wenckebach cycle length 480 VA conduction discontinuous  Retrograde Wenckebach Cycle Length  Accessory pathway function : Absent   Ventricular response the program stimulation  Normal for pacing as described   Radiofrequency energy :  A total of 12.32minutes and  sec of  RF energy was delivered across Cavo-tricuspid isthmus  Fluoroscopy : A total of 13.1 minutes of  fluoroscopy was utilized at 15 frames per second  Impression:  #1-normal sinus node function #2 abnormal atrial function manifested by sustained atrial flutter. Catheter ablation successfully eliminated the substrate for isthmus dependent flutter #3-normal AV nodal function #4-normal His-Purkinje system function #5-no evidence of accessory pathway #6-normal ventricular function as described above.  Pt had a variety of post catheter arrhythmia including different flutters. It was elected to porceed as his clinical rhythm had been typical atrial flutter and this had prompted repeated Vazquez therapies    I think this marks him at increased risk for post flutter atrial arrhythmia   LHC 12/07/21    LV end diastolic pressure is normal.   1.  Mild obstructive  coronary artery disease. 2.  LVEDP of 13 mmHg.   Recommendation: Medical therapy.  Echo 12/06/21  1. Left ventricular ejection fraction, by estimation, is 65 to 70%. The  left ventricle has hyperdynamic function. The left ventricle has no  regional wall motion abnormalities. There is mild concentric left  ventricular hypertrophy. Left ventricular  diastolic function could not be evaluated.   2. Right ventricular systolic function was not well visualized. The right  ventricular size is normal. There is normal pulmonary artery systolic  pressure.   3. The mitral valve is normal in structure. Mild mitral valve  regurgitation. No evidence of mitral stenosis.   4. The aortic valve is normal in structure. Aortic valve regurgitation is  not visualized. No aortic stenosis is present.   5. The inferior vena cava is dilated in size with >50% respiratory  variability, suggesting right atrial pressure of 8 mmHg.    Recent Labs: 05/29/2021: B Natriuretic Peptide 285.6 08/05/2021: ALT 15 12/06/2021: Magnesium 1.9; TSH 3.997 01/13/2022: BUN 22; Creatinine, Ser 1.00; Hemoglobin 13.9; Platelets 254; Potassium 3.8; Sodium 140  Recent Lipid Panel    Component Value Date/Time   CHOL 111 12/06/2021 0230   CHOL 183 08/05/2021 0924   TRIG 187 (H) 12/06/2021 0230   HDL 34 (L) 12/06/2021 0230   HDL 36 (L) 08/05/2021 0924   CHOLHDL 3.3 12/06/2021 0230   VLDL 37 12/06/2021 0230   LDLCALC 40 12/06/2021 0230   LDLCALC 126 (H) 08/05/2021 0924     Risk Assessment/Calculations:    CHA2DS2-VASc Score = 2  {This indicates a 2.2% annual risk of stroke. The patient's score is based upon: CHF History: 0 HTN History: 1 Diabetes History: 0 Stroke History: 0 Vascular Disease History: 0 Age Score: 1 Gender Score: 0    Physical Exam:    VS:  BP 112/72   Pulse (!) 109   Ht 6\' 3"  (1.905 m)   Wt 273 lb 3.2 oz (123.9 kg)   SpO2 96%   BMI 34.15 kg/m     Wt Readings from Last 3 Encounters:  02/02/22  273 lb 3.2 oz (123.9 kg)  02/01/22 272 lb (123.4 kg)  01/19/22 281 lb (127.5 kg)     GEN: Well developed, obese gentleman in no acute distress HEENT: Normal NECK: No JVD; No carotid bruits CARDIAC: Irregular RR, no murmurs, rubs, gallops RESPIRATORY:  Clear to auscultation without rales, wheezing or rhonchi  ABDOMEN: Soft, non-tender, non-distended MUSCULOSKELETAL:  No edema; No deformity. 2+ pedal pulses, equal bilaterally SKIN: Warm and dry NEUROLOGIC:  Alert and oriented x 3 PSYCHIATRIC:  Normal affect   EKG:  EKG is  ordered today.  The ekg ordered today demonstrates atrial flutter at 112 bpm, reviewed with Dr. Caryl [facility]  Diagnoses:    1. Atrial flutter, unspecified type ([place])   2. Chronic anticoagulation   3. Persistent atrial fibrillation ([place])   4. Hyperlipidemia LDL goal <70   5. Abdominal aortic aneurysm (AAA) without rupture, unspecified part ([place])   6. OSA (obstructive sleep apnea)    Assessment and Plan:     Atrial flutter: EKG reveals atrial flutter at 112 bpm. Mild fatigue is only symptom. He monitors on his smart watch. Reviewed plan of care with Dr. Caryl [facility], who is in the office. He advised that patient start flecainide 100 mg twice daily and continue diltiazem and Eliquis. No bleeding concerns on Eliquis. We will get GXT in 2 weeks after starting flecainide.   OSA: Presumed. Awaiting sleep test results.   CAD without angina: Nonobstructive CAD on cath 12/07/21. He denies chest pain, dyspnea, or other symptoms concerning for angina.  No indication for further ischemic evaluation at this time.   AAA: 3.1 cm infrarenal aneurysm seen on CT 11/07/21. Follow-up imaging recommended in 3 years.   Hyperlipidemia LDL goal < 70: LDL 40 on 12/06/21. Well-controlled. Continue Crestor.   Disposition: Keep your June 28 appointment with Dr. Caryl Vazquez   Medication Adjustments/Labs and Tests Ordered: Current medicines are reviewed at length with the patient today.  Concerns regarding  medicines are outlined above.  Orders Placed This Encounter  Procedures   EXERCISE TOLERANCE TEST (ETT)   EKG 12-Lead   Meds ordered this encounter  Medications   flecainide (TAMBOCOR) 100 MG tablet    Sig: Take 1 tablet (100 mg total) by mouth 2 (two) times daily.    Dispense:  180 tablet    Refill:  3    Patient Instructions  Medication Instructions:  Your physician has recommended you make the following change in your medication:  1-Take Flecainide 100 mg by mouth twice daily.  *If you need a refill on your cardiac medications before your next appointment, please call your pharmacy*  Lab Work: If you have labs (blood work) drawn today and your tests are completely normal, you will receive your results only by: [place] (if you have MyChart) OR A paper copy in the mail If you have any lab test that is abnormal or we need to change your treatment, we will call you to review the results.  Testing/Procedures: Your physician has requested that you have an exercise tolerance test in 2 weeks. For further information please visit [email or web address]. Please also follow instruction sheet, as given.  Follow-Up: At [facility], you and your health needs are our priority.  As part of our continuing mission to provide you with exceptional heart care, we have created designated Provider Care Teams.  These Care Teams include your primary Cardiologist (physician) and Advanced Practice Providers (APPs -  Physician Assistants and Nurse Practitioners) who all work together to provide you with the care you need, when you need it.  We recommend signing up for the patient portal called "MyChart".  Sign up information is provided on this After Visit Summary.  MyChart is used to connect with patients for Virtual Visits (Telemedicine).  Patients are able to view lab/test results, encounter notes, upcoming appointments, etc.  Non-urgent messages can be sent to your provider as well.   To learn  more about what you can do with MyChart, go to [email or web address].    Your next appointment:   4 week(s)  The  format for your next appointment:   In Person  Provider:   Dr. Caryl [facility]   Important Information About Sugar         Signed, Kjersti Dittmer, Lanice Schwab, NP  02/03/2022 11:57 PM    [place]

## 2022-02-02 NOTE — Patient Instructions (Signed)
Medication Instructions:  Your physician has recommended you make the following change in your medication:  1-Take Flecainide 100 mg by mouth twice daily.  *If you need a refill on your cardiac medications before your next appointment, please call your pharmacy*  Lab Work: If you have labs (blood work) drawn today and your tests are completely normal, you will receive your results only by: [place] (if you have MyChart) OR A paper copy in the mail If you have any lab test that is abnormal or we need to change your treatment, we will call you to review the results.  Testing/Procedures: Your physician has requested that you have an exercise tolerance test in 2 weeks. For further information please visit [email or web address]. Please also follow instruction sheet, as given.  Follow-Up: At [facility], you and your health needs are our priority.  As part of our continuing mission to provide you with exceptional heart care, we have created designated Provider Care Teams.  These Care Teams include your primary Cardiologist (physician) and Advanced Practice Providers (APPs -  Physician Assistants and Nurse Practitioners) who all work together to provide you with the care you need, when you need it.  We recommend signing up for the patient portal called "MyChart".  Sign up information is provided on this After Visit Summary.  MyChart is used to connect with patients for Virtual Visits (Telemedicine).  Patients are able to view lab/test results, encounter notes, upcoming appointments, etc.  Non-urgent messages can be sent to your provider as well.   To learn more about what you can do with MyChart, go to [email or web address].    Your next appointment:   4 week(s)  The format for your next appointment:   In Person  Provider:   Dr. Caryl Comes   Important Information About Sugar

## 2022-02-03 ENCOUNTER — Encounter: Payer: Self-pay | Admitting: Internal Medicine

## 2022-02-03 NOTE — Telephone Encounter (Signed)
Cardioversion scheduled for A-flutter 02/15/2022 at 830am.  Pt will need to arrive at 7am.  See instruction letter for complete details.Spoke with pt and advised of DCCV date and time.  Reviewed instructions with pt.  Reviewed ED precautions. Pt verbalizes understanding and agrees with current plan.

## 2022-02-03 NOTE — Telephone Encounter (Signed)
Pts impression is that he has been "in and out of rhythm" hence he was suspected to be going in and out, but talking to him today he has been persistently out of rhythm for 48 hrs or so.   Some dyspnea but "feels pretty good."  He is advised if symptoms worsen, as he has had hospitalizations for heart failure because of this in the recent past, to go to the [facility] for ER cardioversion as he has been loaded already on flecainide

## 2022-02-04 ENCOUNTER — Encounter: Payer: Self-pay | Admitting: Nurse Practitioner

## 2022-02-06 ENCOUNTER — Other Ambulatory Visit: Payer: Self-pay | Admitting: Internal Medicine

## 2022-02-06 DIAGNOSIS — I4819 Other persistent atrial fibrillation: Secondary | ICD-10-CM

## 2022-02-09 ENCOUNTER — Telehealth: Payer: Self-pay | Admitting: *Deleted

## 2022-02-09 DIAGNOSIS — G4733 Obstructive sleep apnea (adult) (pediatric): Secondary | ICD-10-CM

## 2022-02-09 NOTE — Telephone Encounter (Signed)
-----   Message from Gaynelle Cage, CMA sent at 02/03/2022  9:26 AM EDT -----  ----- Message ----- From: Quintella Reichert, MD Sent: 02/01/2022   5:01 PM EDT To: Cv Div Sleep Studies  Please let patient know that they had a successful PAP titration and let DME know that orders are in EPIC.  Please set up 6 week OV with me. Please get overnight pulse ox on BiPAP

## 2022-02-09 NOTE — Telephone Encounter (Signed)
The patient has been notified of the result and verbalized understanding.  All questions (if any) were answered. Latrelle Dodrill, CMA 02/09/2022 2:18 PM    Upon patient request DME selection is [facility] Patient understands he will be contacted by [facility] to set up his cpap. Patient understands to call if [facility] does not contact him with new setup in a timely manner. Patient understands they will be called once confirmation has been received from [facility]/ that they have received their new machine to schedule 10 week follow up appointment.   [facility] notified of new cpap order  Please add to airview Patient was grateful for the call and thanked me.

## 2022-02-13 ENCOUNTER — Telehealth: Payer: Self-pay | Admitting: Internal Medicine

## 2022-02-13 NOTE — Telephone Encounter (Signed)
Left message to call back  

## 2022-02-13 NOTE — Telephone Encounter (Signed)
New Message :     Please cancel his procedure for Wednesday. Patient says he is back in [place].

## 2022-02-15 ENCOUNTER — Ambulatory Visit (HOSPITAL_COMMUNITY): Admission: RE | Admit: 2022-02-15 | Payer: Medicare Other | Source: Home / Self Care | Admitting: Cardiology

## 2022-02-15 ENCOUNTER — Encounter (HOSPITAL_COMMUNITY): Admission: RE | Payer: Self-pay | Source: Home / Self Care

## 2022-02-15 SURGERY — CARDIOVERSION
Anesthesia: Monitor Anesthesia Care

## 2022-02-22 ENCOUNTER — Encounter: Payer: Self-pay | Admitting: Internal Medicine

## 2022-02-22 ENCOUNTER — Ambulatory Visit: Payer: Medicare Other | Admitting: Internal Medicine

## 2022-02-22 VITALS — BP 100/58 | HR 64 | Ht 75.0 in | Wt 269.0 lb

## 2022-02-22 DIAGNOSIS — I4892 Unspecified atrial flutter: Secondary | ICD-10-CM

## 2022-02-22 NOTE — Progress Notes (Signed)
Patient Care Team: Garnette Gunner, MD as PCP - General (Family Medicine) Pricilla Riffle, MD as PCP - Cardiology (Cardiology) Beatrice Lecher, PA-C as Physician Assistant (Cardiology)   HPI  [facility] is a 66 y.o. male seen in follow-up for atrial flutter for which  he underwent electrophysiological testing and catheter ablation 5/23; there were multiple circuits identified but there was a stable counterclockwise circuit consistent with electrocardiograms which have been recorded during his hospitalizations.  It was elected to ablate his cavotricuspid isthmus; unfortunately, within a couple of weeks he had recurrence of a flutter.  Flecainide was initiated.  He underwent cardioversion and is holding sinus rhythm.  He is tolerating the flecainide.  No bleeding on the Eliquis.  Peripheral edema DATE TEST EF    10/22 Echo   >75% % Hyperdynamic with mild LVH                      Date Cr K Hgb  3/23 1.03 4.0 14.8              DATE PR interval QRSduration PQRS Dose  5/23  218 92 310 0  6/23 224 96 320 100      Thromboembolic risk factors ( age -83, HTN-1, Vasc disease -1 (carotid)) for a CHADSVASc Score of >=3 Records and Results Reviewed   Past Medical History:  Diagnosis Date   Carotid artery disease ([facility])    Carotid US 12/22: R 40-59; L 1-39   Former smoker 12/21/2015   Hyperlipemia    Kidney stones    Left carotid bruit 08/05/2021   Obesity    Paroxysmal atrial flutter ([facility]) 05/29/2021    Past Surgical History:  Procedure Laterality Date   A-FLUTTER ABLATION N/A 01/16/2022   Procedure: A-FLUTTER ABLATION;  Surgeon: Duke Salvia, MD;  [facility] INVASIVE CV LAB;  Service: Cardiovascular;  Laterality: N/A;   APPENDECTOMY     CARDIOVERSION N/A 05/31/2021   Procedure: CARDIOVERSION;  Surgeon: Chrystie Nose, MD;  Location[facility] ENDOSCOPY;  Service: Cardiovascular;  Laterality: N/A;   CARDIOVERSION N/A 10/05/2021   Procedure: CARDIOVERSION;  Surgeon: Chrystie Nose, MD;  Location: [facility] ENDOSCOPY;  Service: Cardiovascular;  Laterality: N/A;   LEFT HEART CATH AND CORONARY ANGIOGRAPHY N/A 12/07/2021   Procedure: LEFT HEART CATH AND CORONARY ANGIOGRAPHY;  Surgeon: [name], MD;  Location: [facility] INVASIVE CV LAB;  Service: Cardiovascular;  Laterality: N/A;   TEE WITHOUT CARDIOVERSION N/A 05/31/2021   Procedure: TRANSESOPHAGEAL ECHOCARDIOGRAM (TEE);  Surgeon: Chrystie Nose, MD;  Location: [facility] ENDOSCOPY;  Service: Cardiovascular;  Laterality: N/A;    Current Meds  Medication Sig   acetaminophen (TYLENOL) 325 MG tablet Take 325-650 mg by mouth every 6 (six) hours as needed for headache.   apixaban (ELIQUIS) 5 MG TABS tablet Take 1 tablet (5 mg total) by mouth 2 (two) times daily.   b complex vitamins capsule Take 1 capsule by mouth daily.   diltiazem (CARDIZEM CD) 180 MG 24 hr capsule Take 1 capsule (180 mg total) by mouth daily.   flecainide (TAMBOCOR) 100 MG tablet Take 1 tablet (100 mg total) by mouth 2 (two) times daily.   indapamide (LOZOL) 2.5 MG tablet Take 2.5 mg by mouth daily.   Iron, Ferrous Sulfate, 325 (65 Fe) MG TABS Take 325 mg by mouth every other day.   METAMUCIL FIBER PO Take 1 Scoop by mouth daily.   Multiple Vitamin (MULTIVITAMIN) tablet Take  1 tablet by mouth daily.   nitroGLYCERIN (NITROSTAT) 0.4 MG SL tablet Place 1 tablet (0.4 mg total) under the tongue every 5 (five) minutes as needed for chest pain (Max 3 doses).   Omega-3 1000 MG CAPS Take 1,000 mg by mouth daily.    Allergies  Allergen Reactions   Lipitor [Atorvastatin] Other (See Comments)    Myalgias   Codeine Itching   Latex Itching      Review of Systems negative except from HPI and PMH  Physical Exam BP (!) 100/58   Pulse 64   Ht 6\' 3"  (1.905 m)   Wt 269 lb (122 kg)   SpO2 96%   BMI 33.62 kg/m  Well developed and well nourished in no acute distress HENT normal E scleral and icterus clear Neck Supple Clear to ausculation Regular rate and rhythm, no  murmurs gallops or rub Soft with active bowel sounds No clubbing cyanosis 1+ Edema Alert and oriented, grossly normal motor and sensory function Skin Warm and Dry  ECG sinus at 64 Intervals 22/10/43  CrCl cannot be calculated (Patient's most recent lab result is older than the maximum 21 days allowed.).   Assessment and  Plan  Atrial flutter-recurrent-atypical   Morbid obesity    Sleep disordered breathing   Class 1C antiarrhythmic therapy  Recurrent atrial flutter status post ablation, has had multiple surgeries.  Now on flecainide holding sinus rhythm following cardioversion.  He needs GXT for further arrhythmia assessment.  No bleeding on the Eliquis.  We will continue him at 5 mg twice daily.  PR interval and QRSd are within normal range for flecainide exposure.   Current medicines are reviewed at length with the patient today .  The patient does not  have concerns regarding medicines.

## 2022-02-22 NOTE — Patient Instructions (Signed)
Medication Instructions:  Your physician recommends that you continue on your current medications as directed. Please refer to the Current Medication list given to you today.  *If you need a refill on your cardiac medications before your next appointment, please call your pharmacy*   Lab Work: None ordered.  If you have labs (blood work) drawn today and your tests are completely normal, you will receive your results only by: MyChart Message (if you have MyChart) OR A paper copy in the mail If you have any lab test that is abnormal or we need to change your treatment, we will call you to review the results.   Testing/Procedures: Your physician has requested that you have an exercise tolerance test. For further information please visit [email or web address] Please also follow instruction sheet, as given.    Follow-Up: At [facility], you and your health needs are our priority.  As part of our continuing mission to provide you with exceptional heart care, we have created designated Provider Care Teams.  These Care Teams include your primary Cardiologist (physician) and Advanced Practice Providers (APPs -  Physician Assistants and Nurse Practitioners) who all work together to provide you with the care you need, when you need it.  We recommend signing up for the patient portal called "MyChart".  Sign up information is provided on this After Visit Summary.  MyChart is used to connect with patients for Virtual Visits (Telemedicine).  Patients are able to view lab/test results, encounter notes, upcoming appointments, etc.  Non-urgent messages can be sent to your provider as well.   To learn more about what you can do with MyChart, go to [email or web address].    Your next appointment:   12 months with Dr Graciela Husbands  Important Information About Sugar

## 2022-03-02 ENCOUNTER — Ambulatory Visit (INDEPENDENT_AMBULATORY_CARE_PROVIDER_SITE_OTHER): Payer: Medicare Other

## 2022-03-02 DIAGNOSIS — I4892 Unspecified atrial flutter: Secondary | ICD-10-CM

## 2022-03-02 LAB — EXERCISE TOLERANCE TEST
Angina Index: 0
Duke Treadmill Score: 5
Estimated workload: 7
Exercise duration (min): 5 min
Exercise duration (sec): 0 s
MPHR: 154 {beats}/min
Peak HR: 125 {beats}/min
Percent HR: 81 %
RPE: 17
Rest HR: 73 {beats}/min
ST Depression (mm): 0 mm

## 2022-03-29 DIAGNOSIS — G4733 Obstructive sleep apnea (adult) (pediatric): Secondary | ICD-10-CM | POA: Diagnosis not present

## 2022-04-03 ENCOUNTER — Telehealth: Payer: Self-pay | Admitting: Family Medicine

## 2022-04-03 NOTE — Telephone Encounter (Signed)
Left message for patient to call back and schedule Medicare Annual Wellness Visit (AWV).   Please offer to do virtually or by telephone.  Left office number and my jabber #[phone].  AWVI eligible as of 11/26/2021  Please schedule at anytime with Nurse Health Advisor.  

## 2022-04-11 ENCOUNTER — Ambulatory Visit (INDEPENDENT_AMBULATORY_CARE_PROVIDER_SITE_OTHER): Payer: Medicare Other

## 2022-04-11 DIAGNOSIS — Z0000 Encounter for general adult medical examination without abnormal findings: Secondary | ICD-10-CM | POA: Diagnosis not present

## 2022-04-11 DIAGNOSIS — Z1211 Encounter for screening for malignant neoplasm of colon: Secondary | ICD-10-CM

## 2022-04-11 NOTE — Patient Instructions (Signed)
Mr. Richard Vazquez , Thank you for taking time to come for your Medicare Wellness Visit. I appreciate your ongoing commitment to your health goals. Please review the following plan we discussed and let me know if I can assist you in the future.   Screening recommendations/referrals: Colonoscopy: referral 04/11/2022 Recommended yearly ophthalmology/optometry visit for glaucoma screening and checkup Recommended yearly dental visit for hygiene and checkup  Vaccinations: Influenza vaccine: completed  Pneumococcal vaccine: due  Tdap vaccine: 02/07/2020 Shingles vaccine: will consider     Advanced directives: none   Conditions/risks identified: none   Next appointment: none   Preventive Care 65 Years and Older, Male Preventive care refers to lifestyle choices and visits with your health care provider that can promote health and wellness. What does preventive care include? A yearly physical exam. This is also called an annual well check. Dental exams once or twice a year. Routine eye exams. Ask your health care provider how often you should have your eyes checked. Personal lifestyle choices, including: Daily care of your teeth and gums. Regular physical activity. Eating a healthy diet. Avoiding tobacco and drug use. Limiting alcohol use. Practicing safe sex. Taking low doses of aspirin every day. Taking vitamin and mineral supplements as recommended by your health care provider. What happens during an annual well check? The services and screenings done by your health care provider during your annual well check will depend on your age, overall health, lifestyle risk factors, and family history of disease. Counseling  Your health care provider may ask you questions about your: Alcohol use. Tobacco use. Drug use. Emotional well-being. Home and relationship well-being. Sexual activity. Eating habits. History of falls. Memory and ability to understand (cognition). Work and work  Astronomer. Screening  You may have the following tests or measurements: Height, weight, and BMI. Blood pressure. Lipid and cholesterol levels. These may be checked every 5 years, or more frequently if you are over 61 years old. Skin check. Lung cancer screening. You may have this screening every year starting at age 73 if you have a 30-pack-year history of smoking and currently smoke or have quit within the past 15 years. Fecal occult blood test (FOBT) of the stool. You may have this test every year starting at age 24. Flexible sigmoidoscopy or colonoscopy. You may have a sigmoidoscopy every 5 years or a colonoscopy every 10 years starting at age 63. Prostate cancer screening. Recommendations will vary depending on your family history and other risks. Hepatitis C blood test. Hepatitis B blood test. Sexually transmitted disease (STD) testing. Diabetes screening. This is done by checking your blood sugar (glucose) after you have not eaten for a while (fasting). You may have this done every 1-3 years. Abdominal aortic aneurysm (AAA) screening. You may need this if you are a current or former smoker. Osteoporosis. You may be screened starting at age 49 if you are at high risk. Talk with your health care provider about your test results, treatment options, and if necessary, the need for more tests. Vaccines  Your health care provider may recommend certain vaccines, such as: Influenza vaccine. This is recommended every year. Tetanus, diphtheria, and acellular pertussis (Tdap, Td) vaccine. You may need a Td booster every 10 years. Zoster vaccine. You may need this after age 37. Pneumococcal 13-valent conjugate (PCV13) vaccine. One dose is recommended after age 1. Pneumococcal polysaccharide (PPSV23) vaccine. One dose is recommended after age 91. Talk to your health care provider about which screenings and vaccines you need and how often  you need them. This information is not intended to replace  advice given to you by your health care provider. Make sure you discuss any questions you have with your health care provider. Document Released: 09/10/2015 Document Revised: 05/03/2016 Document Reviewed: 06/15/2015 Elsevier Interactive Patient Education  2017 [place] Prevention in the Home Falls can cause injuries. They can happen to people of all ages. There are many things you can do to make your home safe and to help prevent falls. What can I do on the outside of my home? Regularly fix the edges of walkways and driveways and fix any cracks. Remove anything that might make you trip as you walk through a door, such as a raised step or threshold. Trim any bushes or trees on the path to your home. Use bright outdoor lighting. Clear any walking paths of anything that might make someone trip, such as rocks or tools. Regularly check to see if handrails are loose or broken. Make sure that both sides of any steps have handrails. Any raised decks and porches should have guardrails on the edges. Have any leaves, snow, or ice cleared regularly. Use sand or salt on walking paths during winter. Clean up any spills in your garage right away. This includes oil or grease spills. What can I do in the bathroom? Use night lights. Install grab bars by the toilet and in the tub and shower. Do not use towel bars as grab bars. Use non-skid mats or decals in the tub or shower. If you need to sit down in the shower, use a plastic, non-slip stool. Keep the floor dry. Clean up any water that spills on the floor as soon as it happens. Remove soap buildup in the tub or shower regularly. Attach bath mats securely with double-sided non-slip rug tape. Do not have throw rugs and other things on the floor that can make you trip. What can I do in the bedroom? Use night lights. Make sure that you have a light by your bed that is easy to reach. Do not use any sheets or blankets that are too big for your bed.  They should not hang down onto the floor. Have a firm chair that has side arms. You can use this for support while you get dressed. Do not have throw rugs and other things on the floor that can make you trip. What can I do in the kitchen? Clean up any spills right away. Avoid walking on wet floors. Keep items that you use a lot in easy-to-reach places. If you need to reach something above you, use a strong step stool that has a grab bar. Keep electrical cords out of the way. Do not use floor polish or wax that makes floors slippery. If you must use wax, use non-skid floor wax. Do not have throw rugs and other things on the floor that can make you trip. What can I do with my stairs? Do not leave any items on the stairs. Make sure that there are handrails on both sides of the stairs and use them. Fix handrails that are broken or loose. Make sure that handrails are as long as the stairways. Check any carpeting to make sure that it is firmly attached to the stairs. Fix any carpet that is loose or worn. Avoid having throw rugs at the top or bottom of the stairs. If you do have throw rugs, attach them to the floor with carpet tape. Make sure that you have a light  switch at the top of the stairs and the bottom of the stairs. If you do not have them, ask someone to add them for you. What else can I do to help prevent falls? Wear shoes that: Do not have high heels. Have rubber bottoms. Are comfortable and fit you well. Are closed at the toe. Do not wear sandals. If you use a stepladder: Make sure that it is fully opened. Do not climb a closed stepladder. Make sure that both sides of the stepladder are locked into place. Ask someone to hold it for you, if possible. Clearly [place] and make sure that you can see: Any grab bars or handrails. First and last steps. Where the edge of each step is. Use tools that help you move around (mobility aids) if they are needed. These  include: Canes. Walkers. Scooters. Crutches. Turn on the lights when you go into a dark area. Replace any light bulbs as soon as they burn out. Set up your furniture so you have a clear path. Avoid moving your furniture around. If any of your floors are uneven, fix them. If there are any pets around you, be aware of where they are. Review your medicines with your doctor. Some medicines can make you feel dizzy. This can increase your chance of falling. Ask your doctor what other things that you can do to help prevent falls. This information is not intended to replace advice given to you by your health care provider. Make sure you discuss any questions you have with your health care provider. Document Released: 06/10/2009 Document Revised: 01/20/2016 Document Reviewed: 09/18/2014 Elsevier Interactive Patient Education  2017 [employer].

## 2022-04-11 NOTE — Progress Notes (Signed)
Subjective:   Richard Vazquez is a 66 y.o. male who presents for an Initial Medicare Annual Wellness Visit.   I connected with Richard Vazquez  today by telephone and verified that I am speaking with the correct person using two identifiers. Location patient: home Location provider: work Persons participating in the virtual visit: patient, provider.   I discussed the limitations, risks, security and privacy concerns of performing an evaluation and management service by telephone and the availability of in person appointments. I also discussed with the patient that there may be a patient responsible charge related to this service. The patient expressed understanding and verbally consented to this telephonic visit.    Interactive audio and video telecommunications were attempted between this provider and patient, however failed, due to patient having technical difficulties OR patient did not have access to video capability.  We continued and completed visit with audio only.    Review of Systems     Cardiac Risk Factors include: advanced age (>53men, >30 women);male gender     Objective:    Today's Vitals   There is no height or weight on file to calculate BMI.     04/11/2022    2:17 PM 01/16/2022    6:09 AM 12/05/2021   10:48 PM 12/05/2021    3:24 PM 10/05/2021   10:54 AM 05/31/2021    7:07 AM 05/30/2021   10:08 AM  Advanced Directives  Does Patient Have a Medical Advance Directive? No Yes;No No No No No   Would patient like information on creating a medical advance directive? No - Patient declined Yes (MAU/Ambulatory/Procedural Areas - Information given) No - Patient declined  No - Patient declined No - Patient declined No - Patient declined    Current Medications (verified) Outpatient Encounter Medications as of 04/11/2022  Medication Sig   acetaminophen (TYLENOL) 325 MG tablet Take 325-650 mg by mouth every 6 (six) hours as needed for headache.   apixaban (ELIQUIS) 5 MG TABS tablet Take  1 tablet (5 mg total) by mouth 2 (two) times daily.   b complex vitamins capsule Take 1 capsule by mouth daily.   diltiazem (CARDIZEM CD) 180 MG 24 hr capsule Take 1 capsule (180 mg total) by mouth daily.   flecainide (TAMBOCOR) 100 MG tablet Take 1 tablet (100 mg total) by mouth 2 (two) times daily.   indapamide (LOZOL) 2.5 MG tablet Take 2.5 mg by mouth daily.   Iron, Ferrous Sulfate, 325 (65 Fe) MG TABS Take 325 mg by mouth every other day.   Multiple Vitamin (MULTIVITAMIN) tablet Take 1 tablet by mouth daily.   nitroGLYCERIN (NITROSTAT) 0.4 MG SL tablet Place 1 tablet (0.4 mg total) under the tongue every 5 (five) minutes as needed for chest pain (Max 3 doses).   Omega-3 1000 MG CAPS Take 1,000 mg by mouth daily.   METAMUCIL FIBER PO Take 1 Scoop by mouth daily.   rosuvastatin (CRESTOR) 20 MG tablet Take 1 tablet (20 mg total) by mouth daily.   No facility-administered encounter medications on file as of 04/11/2022.    Allergies (verified) Lipitor [atorvastatin], Codeine, and Latex   History: Past Medical History:  Diagnosis Date   Carotid artery disease ([place])    Carotid US 12/22: R 40-59; L 1-39   Former smoker 12/21/2015   Hyperlipemia    Kidney stones    Left carotid bruit 08/05/2021   Obesity    Paroxysmal atrial flutter ([place]) 05/29/2021   Past Surgical History:  Procedure Laterality Date  A-FLUTTER ABLATION N/A 01/16/2022   Procedure: A-FLUTTER ABLATION;  Surgeon: Deboraha Sprang, MD;  Location: [place] [facility];  Service: Cardiovascular;  Laterality: N/A;   APPENDECTOMY     CARDIOVERSION N/A 05/31/2021   Procedure: CARDIOVERSION;  Surgeon: [name], MD;  Location: [facility] ENDOSCOPY;  Service: Cardiovascular;  Laterality: N/A;   CARDIOVERSION N/A 10/05/2021   Procedure: CARDIOVERSION;  Surgeon: [name], MD;  Location: [place];  Service: Cardiovascular;  Laterality: N/A;   LEFT HEART CATH AND CORONARY ANGIOGRAPHY N/A 12/07/2021   Procedure: LEFT HEART CATH  AND CORONARY ANGIOGRAPHY;  Surgeon: [name], MD;  Location: [place] [facility];  Service: Cardiovascular;  Laterality: N/A;   TEE WITHOUT CARDIOVERSION N/A 05/31/2021   Procedure: TRANSESOPHAGEAL ECHOCARDIOGRAM (TEE);  Surgeon: [name], MD;  Location: [facility] ENDOSCOPY;  Service: Cardiovascular;  Laterality: N/A;   History reviewed. No pertinent family history. Social History   Socioeconomic History   Marital status: Married    Spouse name: Not on file   Number of children: Not on file   Years of education: Not on file   Highest education level: Not on file  Occupational History   Not on file  Tobacco Use   Smoking status: Former    Packs/day: 1.50    Years: 40.00    Total pack years: 60.00    Types: Cigarettes    Quit date: 12/09/2019    Years since quitting: 2.3    Passive exposure: Past   Smokeless tobacco: Never  Vaping Use   Vaping Use: Never used  Substance and Sexual Activity   Alcohol use: Not Currently   Drug use: Never   Sexual activity: Not Currently  Other Topics Concern   Not on file  Social History Narrative   Not on file   Social Determinants of Health   Financial Resource Strain: Low Risk  (04/11/2022)   Overall Financial Resource Strain (CARDIA)    Difficulty of Paying Living Expenses: Not hard at all  Food Insecurity: No Food Insecurity (04/11/2022)   Hunger Vital Sign    Worried About Running Out of Food in the Last Year: Never true    [place] in the Last Year: Never true  Transportation Needs: No Transportation Needs (04/11/2022)   PRAPARE - Hydrologist (Medical): No    Lack of Transportation (Non-Medical): No  Physical Activity: Inactive (04/11/2022)   Exercise Vital Sign    Days of Exercise per Week: 0 days    Minutes of Exercise per Session: 0 min  Stress: No Stress Concern Present (04/11/2022)   [place]    Feeling of Stress :  Not at all  Social Connections: Moderately Isolated (04/11/2022)   Social Connection and Isolation Panel [NHANES]    Frequency of Communication with Friends and Family: Three times a week    Frequency of Social Gatherings with Friends and Family: Three times a week    Attends Religious Services: Never    Active Member of Clubs or Organizations: No    Attends Music therapist: Never    Marital Status: Married    Tobacco Counseling Counseling given: Not Answered   Clinical Intake:  Pre-visit preparation completed: Yes  Pain : No/denies pain     Nutritional Risks: None Diabetes: No  How often do you need to have someone help you when you read instructions, pamphlets, or other written materials from  your doctor or pharmacy?: 1 - Never What is the last grade level you completed in school?: High School  Diabetic?no   Interpreter Needed?: No  Information entered by :: L.Wilson,LPN   Activities of Daily Living    04/11/2022    2:18 PM 04/11/2022    1:44 PM  In your present state of health, do you have any difficulty performing the following activities:  Hearing? 0 0  Vision? 0 0  Difficulty concentrating or making decisions? 0 0  Walking or climbing stairs? 0 0  Dressing or bathing? 0 0  Doing errands, shopping? 0 0  Preparing Food and eating ? N N  Using the Toilet? N N  In the past six months, have you accidently leaked urine? N N  Do you have problems with loss of bowel control? N N  Managing your Medications? N N  Managing your Finances? N N  Housekeeping or managing your Housekeeping? N N    Patient Care Team: Garnette Gunner, MD as PCP - General (Family Medicine) Pricilla Riffle, MD as PCP - Cardiology (Cardiology) Kennon Rounds as Physician Assistant (Cardiology)  Indicate any recent Medical Services you may have received from other than [facility] providers in the past year (date may be approximate).     Assessment:   This is a routine  wellness examination for [employer].  Hearing/Vision screen Vision Screening - Comments:: Patient to schedule an appointment   Dietary issues and exercise activities discussed: Current Exercise Habits: The patient does not participate in regular exercise at present, Exercise limited by: None identified   Goals Addressed   None    Depression Screen    04/11/2022    2:18 PM 04/11/2022    2:15 PM 12/14/2021    1:11 PM  PHQ 2/9 Scores  PHQ - 2 Score 0 0 0    Fall Risk    04/11/2022    2:18 PM 04/11/2022    1:44 PM 04/07/2022    3:01 PM 12/14/2021    1:10 PM  Fall Risk   Falls in the past year? 0 0 0 0  Number falls in past yr: 0 0 0 0  Injury with Fall? 0 0 0 0  Follow up Falls evaluation completed;Education provided       FALL RISK PREVENTION PERTAINING TO THE HOME:  Any stairs in or around the home? Yes  If so, are there any without handrails? No  Home free of loose throw rugs in walkways, pet beds, electrical cords, etc? Yes  Adequate lighting in your home to reduce risk of falls? Yes   ASSISTIVE DEVICES UTILIZED TO PREVENT FALLS:  Life alert? No  Use of a cane, walker or w/c? No  Grab bars in the bathroom? No  Shower chair or bench in shower? No  Elevated toilet seat or a handicapped toilet? No    Cognitive Function:    Normal cognitive status assessed by telephone conversation by this Nurse Health Advisor. No abnormalities found.      04/11/2022    2:25 PM  6CIT Screen  What Year? 0 points  What month? 0 points  What time? 0 points  Count back from 20 0 points  Months in reverse 0 points  Repeat phrase 0 points  Total Score 0 points    Immunizations Immunization History  Administered Date(s) Administered   Tdap 02/07/2020    TDAP status: Up to date  Flu Vaccine status: Up to date  Pneumococcal vaccine status:  Due, Education has been provided regarding the importance of this vaccine. Advised may receive this vaccine at local pharmacy or Health Dept.  Aware to provide a copy of the vaccination record if obtained from local pharmacy or Health Dept. Verbalized acceptance and understanding.  Covid-19 vaccine status: Completed vaccines  Qualifies for Shingles Vaccine? Yes   Zostavax completed No   Shingrix Completed?: No.    Education has been provided regarding the importance of this vaccine. Patient has been advised to call insurance company to determine out of pocket expense if they have not yet received this vaccine. Advised may also receive vaccine at local pharmacy or Health Dept. Verbalized acceptance and understanding.  Screening Tests Health Maintenance  Topic Date Due   COVID-19 Vaccine (1) Never done   Hepatitis C Screening  Never done   Zoster Vaccines- Shingrix (1 of 2) Never done   COLONOSCOPY (Pts 45-82yrs Insurance coverage will need to be confirmed)  Never done   Pneumonia Vaccine 44+ Years old (1 - PCV) Never done   INFLUENZA VACCINE  03/28/2022   TETANUS/TDAP  02/06/2030   HPV VACCINES  Aged Out    Health Maintenance  Health Maintenance Due  Topic Date Due   COVID-19 Vaccine (1) Never done   Hepatitis C Screening  Never done   Zoster Vaccines- Shingrix (1 of 2) Never done   COLONOSCOPY (Pts 45-76yrs Insurance coverage will need to be confirmed)  Never done   Pneumonia Vaccine 99+ Years old (1 - PCV) Never done   INFLUENZA VACCINE  03/28/2022    Colorectal cancer screening: Referral to GI placed 04/11/2022. Pt aware the office will call re: appt.  Lung Cancer Screening: (Low Dose CT Chest recommended if Age 76-80 years, 30 pack-year currently smoking OR have quit w/in 15years.) does not qualify.   Lung Cancer Screening Referral: n/a  Additional Screening:  Hepatitis C Screening: does not qualify;   Vision Screening: Recommended annual ophthalmology exams for early detection of glaucoma and other disorders of the eye. Is the patient up to date with their annual eye exam?  No  Who is the provider or what is  the name of the office in which the patient attends annual eye exams? Patient to schedule to schedule with eye doctor  If pt is not established with a provider, would they like to be referred to a provider to establish care? No .   Dental Screening: Recommended annual dental exams for proper oral hygiene  Community Resource Referral / Chronic Care Management: CRR required this visit?  No   CCM required this visit?  No      Plan:     I have personally reviewed and noted the following in the patient's chart:   Medical and social history Use of alcohol, tobacco or illicit drugs  Current medications and supplements including opioid prescriptions. Patient is not currently taking opioid prescriptions. Functional ability and status Nutritional status Physical activity Advanced directives List of other physicians Hospitalizations, surgeries, and ER visits in previous 12 months Vitals Screenings to include cognitive, depression, and falls Referrals and appointments  In addition, I have reviewed and discussed with patient certain preventive protocols, quality metrics, and best practice recommendations. A written personalized care plan for preventive services as well as general preventive health recommendations were provided to patient.     Lorrene Reid, LPN   2/68/3419   Nurse Notes: none

## 2022-04-18 DIAGNOSIS — G4733 Obstructive sleep apnea (adult) (pediatric): Secondary | ICD-10-CM | POA: Diagnosis not present

## 2022-04-24 NOTE — Telephone Encounter (Signed)
Na

## 2022-04-29 DIAGNOSIS — G4733 Obstructive sleep apnea (adult) (pediatric): Secondary | ICD-10-CM | POA: Diagnosis not present

## 2022-05-05 DIAGNOSIS — G4733 Obstructive sleep apnea (adult) (pediatric): Secondary | ICD-10-CM | POA: Diagnosis not present

## 2022-05-16 ENCOUNTER — Ambulatory Visit (INDEPENDENT_AMBULATORY_CARE_PROVIDER_SITE_OTHER): Payer: Medicare Other | Admitting: Family Medicine

## 2022-05-16 ENCOUNTER — Ambulatory Visit (INDEPENDENT_AMBULATORY_CARE_PROVIDER_SITE_OTHER): Payer: Medicare Other

## 2022-05-16 ENCOUNTER — Encounter: Payer: Self-pay | Admitting: Family Medicine

## 2022-05-16 VITALS — BP 136/84 | HR 68 | Temp 97.6°F | Wt 279.6 lb

## 2022-05-16 DIAGNOSIS — M25561 Pain in right knee: Secondary | ICD-10-CM

## 2022-05-16 DIAGNOSIS — Z23 Encounter for immunization: Secondary | ICD-10-CM

## 2022-05-16 DIAGNOSIS — W19XXXA Unspecified fall, initial encounter: Secondary | ICD-10-CM | POA: Diagnosis not present

## 2022-05-16 HISTORY — DX: Pain in right knee: M25.561

## 2022-05-16 MED ORDER — PREDNISONE 20 MG PO TABS: 20.0000 mg | ORAL_TABLET | Freq: Every day | ORAL | 0 refills | Status: AC

## 2022-05-16 MED ORDER — ACETAMINOPHEN 325 MG PO TABS: 650.0000 mg | ORAL_TABLET | Freq: Four times a day (QID) | ORAL | 0 refills | Status: AC | PRN

## 2022-05-16 MED ORDER — DICLOFENAC SODIUM 1 % EX GEL: 4.0000 g | Freq: Four times a day (QID) | CUTANEOUS | 3 refills | Status: DC | PRN

## 2022-05-16 NOTE — Assessment & Plan Note (Signed)
Condition and edema and tenderness slightly lateral to the patella Pain with flexion extension, but overall exam appears stable Knee x-ray ordered NSAIDs contraindicated due to anticoagulation Continue Tylenol, Voltaren, ice and heat, may also consider bracing Return cautions discussed

## 2022-05-16 NOTE — Progress Notes (Signed)
Assessment/Plan:   Problem List Items Addressed This Visit       Other   Acute pain of right knee - Primary    Condition and edema and tenderness slightly lateral to the patella Pain with flexion extension, but overall exam appears stable Knee x-ray ordered NSAIDs contraindicated due to anticoagulation Continue Tylenol, Voltaren, ice and heat, may also consider bracing Return cautions discussed      Relevant Medications   predniSONE (DELTASONE) 20 MG tablet   diclofenac Sodium (VOLTAREN) 1 % GEL   acetaminophen (TYLENOL) 325 MG tablet   Other Relevant Orders   DG Knee Complete 4 Views Right   Other Visit Diagnoses     Need for influenza vaccination       Relevant Orders   Flu Vaccine QUAD High Dose(Fluad) (Completed)   Fall, initial encounter       Relevant Medications   predniSONE (DELTASONE) 20 MG tablet   diclofenac Sodium (VOLTAREN) 1 % GEL   acetaminophen (TYLENOL) 325 MG tablet   Other Relevant Orders   DG Knee Complete 4 Views Right          Subjective:  HPI:  Richard Vazquez is a 66 y.o. male who has Atrial flutter (HCC); Hyperlipemia; Hematuria; Persistent atrial fibrillation (HCC); CAD (coronary artery disease); Shift work sleep disorder; OSA (obstructive sleep apnea); Class 2 obesity due to excess calories without serious comorbidity with body mass index (BMI) of 35.0 to 35.9 in adult; Renal stones; Prediabetes; Chronic constipation; and Acute pain of right knee on their problem list..   He  has a past medical history of Carotid artery disease (HCC), Former smoker (12/21/2015), Hyperlipemia, Kidney stones, Left carotid bruit (08/05/2021), Obesity, and Paroxysmal atrial flutter (HCC) (05/29/2021).[name]   He presents with chief complaint of Knee Injury (Right knee pain after fall last night. Patient states that it's swollen and warm to touch.) .   Knee Pain: Patient presents with a knee injury involving the  right knee. Onset of the symptoms was yesterday. Inciting  event: injured during a fall while moving pallet jack . Current symptoms include pain located anterior knee . Pain is aggravated by  extension and flexion .  Patient has had no prior knee problems. Evaluation to date: none. Treatment to date: OTC analgesics which are somewhat effective.   Past Surgical History:  Procedure Laterality Date   A-FLUTTER ABLATION N/A 01/16/2022   Procedure: A-FLUTTER ABLATION;  Surgeon: Duke Salvia, MD;  Location: [facility] INVASIVE CV LAB;  Service: Cardiovascular;  Laterality: N/A;   APPENDECTOMY     CARDIOVERSION N/A 05/31/2021   Procedure: CARDIOVERSION;  Surgeon: Chrystie Nose, MD;  Location: [facility] ENDOSCOPY;  Service: Cardiovascular;  Laterality: N/A;   CARDIOVERSION N/A 10/05/2021   Procedure: CARDIOVERSION;  Surgeon: Chrystie Nose, MD;  Location: [facility] ENDOSCOPY;  Service: Cardiovascular;  Laterality: N/A;   LEFT HEART CATH AND CORONARY ANGIOGRAPHY N/A 12/07/2021   Procedure: LEFT HEART CATH AND CORONARY ANGIOGRAPHY;  Surgeon: [name], MD;  Location: [facility] INVASIVE CV LAB;  Service: Cardiovascular;  Laterality: N/A;   TEE WITHOUT CARDIOVERSION N/A 05/31/2021   Procedure: TRANSESOPHAGEAL ECHOCARDIOGRAM (TEE);  Surgeon: Chrystie Nose, MD;  Location: [facility] ENDOSCOPY;  Service: Cardiovascular;  Laterality: N/A;    Outpatient Medications Prior to Visit  Medication Sig Dispense Refill   apixaban (ELIQUIS) 5 MG TABS tablet Take 1 tablet (5 mg total) by mouth 2 (two) times daily. 180 tablet 3   b complex vitamins capsule Take 1 capsule  by mouth daily.     diltiazem (CARDIZEM CD) 180 MG 24 hr capsule Take 1 capsule (180 mg total) by mouth daily. 90 capsule 3   flecainide (TAMBOCOR) 100 MG tablet Take 1 tablet (100 mg total) by mouth 2 (two) times daily. 180 tablet 3   indapamide (LOZOL) 2.5 MG tablet Take 2.5 mg by mouth daily.     Iron, Ferrous Sulfate, 325 (65 Fe) MG TABS Take 325 mg by mouth every other day.     Multiple Vitamin (MULTIVITAMIN) tablet Take 1  tablet by mouth daily.     nitroGLYCERIN (NITROSTAT) 0.4 MG SL tablet Place 1 tablet (0.4 mg total) under the tongue every 5 (five) minutes as needed for chest pain (Max 3 doses). 20 tablet 2   Omega-3 1000 MG CAPS Take 1,000 mg by mouth daily.     acetaminophen (TYLENOL) 325 MG tablet Take 325-650 mg by mouth every 6 (six) hours as needed for headache.     METAMUCIL FIBER PO Take 1 Scoop by mouth daily.     rosuvastatin (CRESTOR) 20 MG tablet Take 1 tablet (20 mg total) by mouth daily. 90 tablet 3   No facility-administered medications prior to visit.    No family history on file.  Social History   Socioeconomic History   Marital status: Married    Spouse name: Not on file   Number of children: Not on file   Years of education: Not on file   Highest education level: Not on file  Occupational History   Not on file  Tobacco Use   Smoking status: Former    Packs/day: 1.50    Years: 40.00    Total pack years: 60.00    Types: Cigarettes    Quit date: 12/09/2019    Years since quitting: 2.4    Passive exposure: Past   Smokeless tobacco: Never  Vaping Use   Vaping Use: Never used  Substance and Sexual Activity   Alcohol use: Not Currently   Drug use: Never   Sexual activity: Not Currently  Other Topics Concern   Not on file  Social History Narrative   Not on file   Social Determinants of Health   Financial Resource Strain: Low Risk  (04/11/2022)   Overall Financial Resource Strain (CARDIA)    Difficulty of Paying Living Expenses: Not hard at all  Food Insecurity: No Food Insecurity (04/11/2022)   Hunger Vital Sign    Worried About Running Out of Food in the Last Year: Never true    [place] in the Last Year: Never true  Transportation Needs: No Transportation Needs (04/11/2022)   PRAPARE - Hydrologist (Medical): No    Lack of Transportation (Non-Medical): No  Physical Activity: Inactive (04/11/2022)   Exercise Vital Sign    Days of  Exercise per Week: 0 days    Minutes of Exercise per Session: 0 min  Stress: No Stress Concern Present (04/11/2022)   [place]    Feeling of Stress : Not at all  Social Connections: Moderately Isolated (04/11/2022)   Social Connection and Isolation Panel [NHANES]    Frequency of Communication with Friends and Family: Three times a week    Frequency of Social Gatherings with Friends and Family: Three times a week    Attends Religious Services: Never    Active Member of Clubs or Organizations: No    Attends Archivist Meetings: Never  Marital Status: Married  Catering manager Violence: Not At Risk (04/11/2022)   Humiliation, Afraid, Rape, and Kick questionnaire    Fear of Current or Ex-Partner: No    Emotionally Abused: No    Physically Abused: No    Sexually Abused: No                                                                                                 Objective:  Physical Exam: BP 136/84 (BP Location: Left Arm, Patient Position: Sitting, Cuff Size: Large)   Pulse 68   Temp 97.6 F (36.4 C) (Temporal)   Wt 279 lb 9.6 oz (126.8 kg)   SpO2 96%   BMI 34.95 kg/m    General: No acute distress. Awake and conversant.  Eyes: Normal conjunctiva, anicteric. Round symmetric pupils.  ENT: Hearing grossly intact. No nasal discharge.  Neck: Neck is supple. No masses or thyromegaly.  Respiratory: Respirations are non-labored. No auditory wheezing.  Skin: Warm. No rashes or ulcers.  Psych: Alert and oriented. Cooperative, Appropriate mood and affect, Normal judgment.  CV: No cyanosis or JVD MSK: Normal ambulation. No clubbing  Knee Musculoskeletal Exam Gait   Gait is normal.  Inspection   Leg length disparity: no discrepancy   Right     Erythema: mild       Effusion: mild       Edema: mild       Ecchymosis: small       Deformity: none       Alignment: normal       Previous incision: no  previous incision  Palpation   Right     Increased warmth: none     Masses: none       Crepitus: none       Tenderness: present         Medial joint line: mild         Patella: mild    Range of Motion   Right     Right knee range of motion is normal.    Strength   Right     Right knee strength is normal.    Instability   Right     Instability signs: none - stable     Varus stress grade: normal     Valgus stress grade: normal     Anterior drawer: normal     Posterior drawer: normal   Neuro: Sensation and CN II-XII grossly normal.        Garner Nash, MD, MS

## 2022-05-16 NOTE — Patient Instructions (Addendum)
[facility] at [facility] Address: [address], [place], [place] [address] Phone: [phone]

## 2022-05-18 ENCOUNTER — Telehealth: Payer: Self-pay

## 2022-05-18 NOTE — Telephone Encounter (Signed)
Advised patient of annotation below and he verbalized understanding.  

## 2022-05-18 NOTE — Telephone Encounter (Signed)
Patient called in inquiring about his x-ray results from 05/16/22. He states his knee is still swollen, but he wanted to try to go to work this evening. I advised him we were still waiting on the radiologist findings. Is there any updates on his results?

## 2022-05-23 ENCOUNTER — Ambulatory Visit (INDEPENDENT_AMBULATORY_CARE_PROVIDER_SITE_OTHER): Payer: Medicare Other | Admitting: Family Medicine

## 2022-05-23 ENCOUNTER — Encounter: Payer: Self-pay | Admitting: Family Medicine

## 2022-05-23 VITALS — BP 128/72 | HR 72 | Wt 283.4 lb

## 2022-05-23 DIAGNOSIS — S8001XD Contusion of right knee, subsequent encounter: Secondary | ICD-10-CM | POA: Diagnosis not present

## 2022-05-23 NOTE — Progress Notes (Signed)
[facility] [address] [place] [address] Dept: [phone] Dept Fax: [phone]  Office Visit  Subjective:    Patient ID: Richard Vazquez, male    DOB: 1956/03/26, 66 y.o..   MRN: [id number]  Chief Complaint  Patient presents with   Acute Visit    Rt Knee sore, warm to the touch. Pt stated to come back in if symptoms worsen. Tylenol for pain.    History of Present Illness:  Patient is in today for reassessment of his knee. Mr. Lowe was seen last week by Dr. Grandville Silos. He had suffered a fall at work. He notes he tripped while walking with his hands full. He feel onto his right knee. He notes he was able to watch the surveillance video of the incident. It took him 28 secs before he got up after the fall, which surprised him. He was sent for an x-ray last week. He notes that he has had some bruising show up with time. He has also noted increased warmth over the knee. He did go back to work yesterday.  Past Medical History: Patient Active Problem List   Diagnosis Date Noted   Acute pain of right knee 05/16/2022   Shift work sleep disorder 12/14/2021   OSA (obstructive sleep apnea) 12/14/2021   Renal stones 12/14/2021   Prediabetes 12/14/2021   Chronic constipation 12/14/2021   CAD (coronary artery disease) 12/08/2021   Persistent atrial fibrillation (HCC)    Hematuria 08/05/2021   Atrial flutter ([place]) 05/29/2021   Class 2 obesity due to excess calories without serious comorbidity with body mass index (BMI) of 35.0 to 35.9 in adult 01/12/2020   Hyperlipemia 06/05/2014   Past Surgical History:  Procedure Laterality Date   A-FLUTTER ABLATION N/A 01/16/2022   Procedure: A-FLUTTER ABLATION;  Surgeon: Deboraha Sprang, MD;  Location: [place] CV LAB;  Service: Cardiovascular;  Laterality: N/A;   APPENDECTOMY     CARDIOVERSION N/A 05/31/2021   Procedure: CARDIOVERSION;  Surgeon: [name], MD;  Location: [facility] ENDOSCOPY;   Service: Cardiovascular;  Laterality: N/A;   CARDIOVERSION N/A 10/05/2021   Procedure: CARDIOVERSION;  Surgeon: [name], MD;  Location: [place];  Service: Cardiovascular;  Laterality: N/A;   LEFT HEART CATH AND CORONARY ANGIOGRAPHY N/A 12/07/2021   Procedure: LEFT HEART CATH AND CORONARY ANGIOGRAPHY;  Surgeon: [name], MD;  Location: [place] CV LAB;  Service: Cardiovascular;  Laterality: N/A;   TEE WITHOUT CARDIOVERSION N/A 05/31/2021   Procedure: TRANSESOPHAGEAL ECHOCARDIOGRAM (TEE);  Surgeon: [name], MD;  Location: [facility] ENDOSCOPY;  Service: Cardiovascular;  Laterality: N/A;   History reviewed. No pertinent family history.  Outpatient Medications Prior to Visit  Medication Sig Dispense Refill   acetaminophen (TYLENOL) 325 MG tablet Take 2 tablets (650 mg total) by mouth every 6 (six) hours as needed for up to 15 days for mild pain or moderate pain. 30 tablet 0   apixaban (ELIQUIS) 5 MG TABS tablet Take 1 tablet (5 mg total) by mouth 2 (two) times daily. 180 tablet 3   b complex vitamins capsule Take 1 capsule by mouth daily.     diclofenac Sodium (VOLTAREN) 1 % GEL Apply 4 g topically 4 (four) times daily as needed. 100 g 3   diltiazem (CARDIZEM CD) 180 MG 24 hr capsule Take 1 capsule (180 mg total) by mouth daily. 90 capsule 3   flecainide (TAMBOCOR) 100 MG tablet Take 1 tablet (100 mg total) by mouth 2 (  two) times daily. 180 tablet 3   indapamide (LOZOL) 2.5 MG tablet Take 2.5 mg by mouth daily.     Iron, Ferrous Sulfate, 325 (65 Fe) MG TABS Take 325 mg by mouth every other day.     Multiple Vitamin (MULTIVITAMIN) tablet Take 1 tablet by mouth daily.     nitroGLYCERIN (NITROSTAT) 0.4 MG SL tablet Place 1 tablet (0.4 mg total) under the tongue every 5 (five) minutes as needed for chest pain (Max 3 doses). 20 tablet 2   Omega-3 1000 MG CAPS Take 1,000 mg by mouth daily.     METAMUCIL FIBER PO Take 1 Scoop by mouth daily. (Patient not taking: Reported on  05/23/2022)     rosuvastatin (CRESTOR) 20 MG tablet Take 1 tablet (20 mg total) by mouth daily. 90 tablet 3   No facility-administered medications prior to visit.    Allergies  Allergen Reactions   Lipitor [Atorvastatin] Other (See Comments)    Myalgias   Codeine Itching   Latex Itching    Objective:   Today's Vitals   05/23/22 1452  BP: 128/72  Pulse: 72  SpO2: 93%  Weight: 283 lb 6.4 oz (128.5 kg)   Body mass index is 35.42 kg/m.   General: Well developed, well nourished. No acute distress. Extremities: There is a 4-5 cm hematoma overlying the lateral aspect of the patellar area of   the knee. There is increased warmth associated with this. There is some yellowing around the  margins. The hematoma is soft. There is ecchymosis in the posterior leg around the popliteal   fossa. There is also some ecchymosis apparent in the lower right leg, near the ankle. He as   2-3+ bilateral lower leg edema. Full ROM. varus/valgus testing, Lachman's and McMurray's    test are all normal. Skin: Warm and dry. No rashes. Neuro: CN II-XII intact. Normal sensation and DTR bilaterally. Psych: Alert and oriented. Normal mood and affect.  Health Maintenance Due  Topic Date Due   Hepatitis C Screening  Never done   Zoster Vaccines- Shingrix (1 of 2) Never done   COLONOSCOPY (Pts 45-48yrs Insurance coverage will need to be confirmed)  Never done   Pneumonia Vaccine 36+ Years old (1 - PCV) Never done    Imaging Right knee x-ray (05/16/2022) IMPRESSION: Negative.    Assessment & Plan:   1. Traumatic hematoma of right knee, subsequent encounter Mr. Lorenzini has a hematoma that developed from where he struck his knee during his recent fall. This likely developed as a consequence of him being on anticoagulation with Eliquis. He has no sign of infection. I provided him with an Ace wrap for compression. I recommend he use heat over this area to help resolve the hematoma. He should continue to monitor  for sings of infection.  Return in about 2 weeks (around 06/06/2022) for Reassessment.   Haydee Salter, MD

## 2022-05-29 DIAGNOSIS — G4733 Obstructive sleep apnea (adult) (pediatric): Secondary | ICD-10-CM | POA: Diagnosis not present

## 2022-06-08 ENCOUNTER — Ambulatory Visit: Payer: Medicare Other | Attending: Cardiology | Admitting: Cardiology

## 2022-06-08 ENCOUNTER — Encounter: Payer: Self-pay | Admitting: Cardiology

## 2022-06-08 VITALS — BP 130/70 | HR 62 | Ht 75.0 in | Wt 280.0 lb

## 2022-06-08 DIAGNOSIS — G4733 Obstructive sleep apnea (adult) (pediatric): Secondary | ICD-10-CM

## 2022-06-08 NOTE — Progress Notes (Signed)
SLEEP MEDICINE VIRTUAL CONSULT NOTE via Video Note   Because of Richard Vazquez's co-morbid illnesses, he is at least at moderate risk for complications without adequate follow up.  This format is felt to be most appropriate for this patient at this time.  All issues noted in this document were discussed and addressed.  A limited physical exam was performed with this format.  Please refer to the patient's chart for his consent to telehealth for Richard Vazquez.      Date:  06/08/2022   ID:  Richard Vazquez, DOB 1955-09-26, MRN [id number] The patient was identified using 2 identifiers.  Patient Location: Home Provider Location: Home Office   PCP:  [facility], [place] [facility] Cardiologist:  Dorris Carnes, MD Cardiology APP:  Liliane Shi, PA-C     Evaluation Performed:  Consultation - Richard Vazquez was referred by Dorris Carnes, MD for the evaluation of OSA.  Chief Complaint:  OSA  History of Present Illness:    Richard Vazquez is a 66 y.o. male who is being seen today for the evaluation of OSA at the request of Dorris Carnes, MD.  Richard Vazquez is a 66 y.o. male with a hx of Carotid artery stenosis, remote tobacco use, HLD and PAFlutter.  During his DCCV, he had significant O2 desats and was told that he might have OSA.  His wife said that he snored very badly and would feel tired when he would wake up and then get sleepy at work.    He was set up for home sleep study.  Sleep study demonstrated severe OSA with an AHI of 62.6/hr and nocturnal Hypoxemia with O2 sats as low as 77%.  He underwent CPAP titration but due to ongoing events was transitioned to BiPAP and placed on BiPAP at 11/7cm H2O.    He is doing well with his PAP device and thinks that he has gotten used to it.  He tolerates the mask and feels the pressure is adequate.  Since going on PAP he feels rested in the am and has no significant daytime sleepiness.  He denies any significant mouth or nasal dryness or  nasal congestion.  He does not think that he snores.    Past Medical History:  Diagnosis Date   Carotid artery disease ([place])    Carotid US 12/22: R 40-59; L 1-39   Former smoker 12/21/2015   Hyperlipemia    Kidney stones    Left carotid bruit 08/05/2021   Obesity    Paroxysmal atrial flutter ([place]) 05/29/2021   Past Surgical History:  Procedure Laterality Date   A-FLUTTER ABLATION N/A 01/16/2022   Procedure: A-FLUTTER ABLATION;  Surgeon: Deboraha Sprang, MD;  Location: [place] CV LAB;  Service: Cardiovascular;  Laterality: N/A;   APPENDECTOMY     CARDIOVERSION N/A 05/31/2021   Procedure: CARDIOVERSION;  Surgeon: [name], MD;  Location: [facility] ENDOSCOPY;  Service: Cardiovascular;  Laterality: N/A;   CARDIOVERSION N/A 10/05/2021   Procedure: CARDIOVERSION;  Surgeon: [name], MD;  Location: [place];  Service: Cardiovascular;  Laterality: N/A;   LEFT HEART CATH AND CORONARY ANGIOGRAPHY N/A 12/07/2021   Procedure: LEFT HEART CATH AND CORONARY ANGIOGRAPHY;  Surgeon: [name], MD;  Location: [place] CV LAB;  Service: Cardiovascular;  Laterality: N/A;   TEE WITHOUT CARDIOVERSION N/A 05/31/2021   Procedure: TRANSESOPHAGEAL ECHOCARDIOGRAM (TEE);  Surgeon: [name], MD;  Location: [place];  Service: Cardiovascular;  Laterality: N/A;     Current Meds  Medication Sig   apixaban (ELIQUIS) 5 MG TABS tablet Take 1 tablet (5 mg total) by mouth 2 (two) times daily.   b complex vitamins capsule Take 1 capsule by mouth daily.   diclofenac Sodium (VOLTAREN) 1 % GEL Apply 4 g topically 4 (four) times daily as needed.   diltiazem (CARDIZEM CD) 180 MG 24 hr capsule Take 1 capsule (180 mg total) by mouth daily.   flecainide (TAMBOCOR) 100 MG tablet Take 1 tablet (100 mg total) by mouth 2 (two) times daily.   indapamide (LOZOL) 2.5 MG tablet Take 2.5 mg by mouth daily.   Iron, Ferrous Sulfate, 325 (65 Fe) MG TABS Take 325 mg by mouth every other day.   Multiple  Vitamin (MULTIVITAMIN) tablet Take 1 tablet by mouth daily.   nitroGLYCERIN (NITROSTAT) 0.4 MG SL tablet Place 1 tablet (0.4 mg total) under the tongue every 5 (five) minutes as needed for chest pain (Max 3 doses).   Omega-3 1000 MG CAPS Take 1,000 mg by mouth daily.   polyethylene glycol (MIRALAX / GLYCOLAX) 17 g packet Take 17 g by mouth daily.   rosuvastatin (CRESTOR) 20 MG tablet Take 1 tablet (20 mg total) by mouth daily.     Allergies:   Lipitor [atorvastatin], Codeine, and Latex   Social History   Tobacco Use   Smoking status: Former    Packs/day: 1.50    Years: 40.00    Total pack years: 60.00    Types: Cigarettes    Quit date: 12/09/2019    Years since quitting: 2.4    Passive exposure: Past   Smokeless tobacco: Never  Vaping Use   Vaping Use: Never used  Substance Use Topics   Alcohol use: Not Currently   Drug use: Never     Family Hx: The patient's family history is not on file.  ROS:   Please see the history of present illness.     All other systems reviewed and are negative.   Prior Sleep studies:   The following studies were reviewed today:  Home sleep study, PAP titration, PAP compliance download  Labs/Other Tests and Data Reviewed:     Recent Labs: 08/05/2021: ALT 15 12/06/2021: Magnesium 1.9; TSH 3.997 01/13/2022: BUN 22; Creatinine, Ser 1.00; Hemoglobin 13.9; Platelets 254; Potassium 3.8; Sodium 140    Wt Readings from Last 3 Encounters:  06/08/22 280 lb (127 kg)  05/23/22 283 lb 6.4 oz (128.5 kg)  05/16/22 279 lb 9.6 oz (126.8 kg)     Risk Assessment/Calculations:    CHA2DS2-VASc Score = 2   This indicates a 2.2% annual risk of stroke. The patient's score is based upon: CHF History: 0 HTN History: 1 Diabetes History: 0 Stroke History: 0 Vascular Disease History: 0 Age Score: 1 Gender Score: 0         Objective:    Vital Signs:  BP 130/70   Pulse 62   Ht 6\' 3"  (1.905 m)   Wt 280 lb (127 kg)   SpO2 99%   BMI 35.00 kg/m     VITAL SIGNS:  reviewed GEN:  no acute distress EYES:  sclerae anicteric, EOMI - Extraocular Movements Intact RESPIRATORY:  normal respiratory effort, symmetric expansion CARDIOVASCULAR:  no peripheral edema SKIN:  no rash, lesions or ulcers. MUSCULOSKELETAL:  no obvious deformities. NEURO:  alert and oriented x 3, no obvious focal deficit PSYCH:  normal affect  ASSESSMENT & PLAN:    OSA - The  patient is tolerating PAP therapy well without any problems. The PAP download performed by his DME was personally reviewed and interpreted by me today and showed an AHI of 0.9/hr on BiPAP at 11/7 cm H2O with 97% compliance in using more than 4 hours nightly.  The patient has been using and benefiting from PAP use and will continue to benefit from therapy.    Time:   Today, I have spent 20 minutes with the patient with telehealth technology discussing the above problems.     Medication Adjustments/Labs and Tests Ordered: Current medicines are reviewed at length with the patient today.  Concerns regarding medicines are outlined above.   Tests Ordered: No orders of the defined types were placed in this encounter.   Medication Changes: No orders of the defined types were placed in this encounter.   Follow Up:  In Person in 1 year(s)  Signed, Armanda Magic, MD  06/08/2022 10:35 AM    [facility]

## 2022-06-08 NOTE — Patient Instructions (Signed)
Medication Instructions:  Your physician recommends that you continue on your current medications as directed. Please refer to the Current Medication list given to you today.  *If you need a refill on your cardiac medications before your next appointment, please call your pharmacy*  Follow-Up: At [facility], you and your health needs are our priority.  As part of our continuing mission to provide you with exceptional heart care, we have created designated Provider Care Teams.  These Care Teams include your primary Cardiologist (physician) and Advanced Practice Providers (APPs -  Physician Assistants and Nurse Practitioners) who all work together to provide you with the care you need, when you need it.  Your next appointment:   1 year(s)  The format for your next appointment:   In Person  Provider:   Traci Turner, MD     Important Information About Sugar       

## 2022-06-09 ENCOUNTER — Telehealth: Payer: Self-pay | Admitting: Family Medicine

## 2022-06-09 ENCOUNTER — Telehealth: Payer: Self-pay | Admitting: Internal Medicine

## 2022-06-09 DIAGNOSIS — N200 Calculus of kidney: Secondary | ICD-10-CM | POA: Diagnosis not present

## 2022-06-09 DIAGNOSIS — Z87442 Personal history of urinary calculi: Secondary | ICD-10-CM | POA: Diagnosis not present

## 2022-06-09 DIAGNOSIS — N400 Enlarged prostate without lower urinary tract symptoms: Secondary | ICD-10-CM | POA: Diagnosis not present

## 2022-06-09 NOTE — Telephone Encounter (Signed)
Spoke with patient and he inquired if possible if he can do a video visit for the f/u visit due to his work schedule?

## 2022-06-09 NOTE — Telephone Encounter (Signed)
Patient scheduled on 06/15/2022 at 3:20 pm with pcp.

## 2022-06-09 NOTE — Telephone Encounter (Signed)
Caller Name: Adedamola Seto Call back phone #: [phone]  Reason for Call: Was in on 9/26 and recommended to came back in 2 weeks for reassessment. He doesn't feel like he needs one, please advise

## 2022-06-09 NOTE — Telephone Encounter (Signed)
   Pre-operative Risk Assessment    Patient Name: Richard Vazquez  DOB: 02-09-1956 MRN: [id number]      Request for Surgical Clearance    Procedure:   Part 1 stink placement bilateral retrogrades   Part 2 is shockwave lithotripsy   Date of Surgery:  Clearance Part 1 06/19/22   Part 2 06/26/22                              Surgeon:  Dr. Thomasene Mohair Surgeon's Group or Practice Name:  [facility]  Phone number:  [phone] Fax number:  [phone]   Type of Clearance Requested:   - Medical  - Pharmacy:  Hold Apixaban (Eliquis)     Type of Anesthesia:  Local    Additional requests/questions:      SignedMilbert Coulter   06/09/2022, 3:57 PM

## 2022-06-12 ENCOUNTER — Telehealth: Payer: Self-pay | Admitting: Internal Medicine

## 2022-06-12 ENCOUNTER — Encounter: Payer: Self-pay | Admitting: Internal Medicine

## 2022-06-12 NOTE — Telephone Encounter (Signed)
Patient with diagnosis of afib on Eliquis for anticoagulation.    Procedure:  Part 1 stent placement bilateral retrogrades   Part 2 is shockwave lithotripsy  Date of procedure: 06/19/22 and 10/30   CHA2DS2-VASc Score = 3   This indicates a 3.2% annual risk of stroke. The patient's score is based upon: CHF History: 0 HTN History: 1 Diabetes History: 0 Stroke History: 0 Vascular Disease History: 1 Age Score: 1 Gender Score: 0      CrCl 104 ml/min  Per office protocol, patient can hold Eliquis for 2 days prior to each procedure.    Patient should resume Eliquis when deemed safe between each procedure.  **This guidance is not considered finalized until pre-operative APP has relayed final recommendations.**

## 2022-06-12 NOTE — Telephone Encounter (Signed)
I have sent a message to pharm-d for recommendations on Eliquis.

## 2022-06-12 NOTE — Telephone Encounter (Signed)
Attempted phone call to pt.  Left voicemail to contact RN at [phone]. 

## 2022-06-12 NOTE — Telephone Encounter (Signed)
   Name: Richard Vazquez  DOB: 1956/07/20  MRN: [id number]   Primary Cardiologist: Dorris Carnes, MD  Chart reviewed as part of pre-operative protocol coverage. Patient was contacted 06/12/2022 in reference to pre-operative risk assessment for pending surgery as outlined below.  Richard Vazquez was last seen on 02/22/2022 by Dr. Caryl Comes.  Since that day, Richard Vazquez has done well from a cardiac standpoint.  He denies any new symptoms or concerns.  He has a able to complete greater than 4 METS without difficulty.  Therefore, based on ACC/AHA guidelines, the patient would be at acceptable risk for the planned procedure without further cardiovascular testing.   The patient was advised that if he develops new symptoms prior to surgery to contact our office to arrange for a follow-up visit, and he verbalized understanding.  Per office protocol, patient can hold Eliquis for 2 days prior to each procedure  (Part 1 stent placement bilateral retrogrades Part 2 is shockwave lithotripsy  Date of procedure: 06/19/22 and 10/30). Patient should resume Eliquis when deemed safe between each procedure.     I will route this recommendation to the requesting party via Epic fax function and remove from pre-op pool. Please call with questions.  Lenna Sciara, NP 06/12/2022, 4:21 PM

## 2022-06-12 NOTE — Telephone Encounter (Signed)
Patient called to talk with Raquel Sarna or nurse in regards to medical clearance

## 2022-06-12 NOTE — Telephone Encounter (Signed)
Office calling to f/u on Clearance for pt. Please advise

## 2022-06-14 NOTE — Telephone Encounter (Signed)
  christy [facility]) calling back, they received the clearance but hey also need copy of pt's recent EKG. She ask if that can be fax today

## 2022-06-15 ENCOUNTER — Ambulatory Visit (INDEPENDENT_AMBULATORY_CARE_PROVIDER_SITE_OTHER): Payer: Medicare Other | Admitting: Family Medicine

## 2022-06-15 ENCOUNTER — Encounter: Payer: Self-pay | Admitting: Family Medicine

## 2022-06-15 VITALS — BP 132/84 | HR 70 | Temp 97.8°F | Wt 273.6 lb

## 2022-06-15 DIAGNOSIS — M25561 Pain in right knee: Secondary | ICD-10-CM | POA: Diagnosis not present

## 2022-06-15 NOTE — Patient Instructions (Signed)
I am glad you knee is better

## 2022-06-15 NOTE — Progress Notes (Signed)
Assessment/Plan:   Problem List Items Addressed This Visit       Other   Acute pain of right knee - Primary    Significantly improved.  Reassurance provided.  Patient follow-up as needed   Subjective:  HPI:  Richard Vazquez is a 66 y.o. male who has Atrial flutter ([place]); Hyperlipemia; Hematuria; Persistent atrial fibrillation ([place]); CAD (coronary artery disease); Shift work sleep disorder; OSA (obstructive sleep apnea); Class 2 obesity due to excess calories without serious comorbidity with body mass index (BMI) of 35.0 to 35.9 in adult; Renal stones; Prediabetes; Chronic constipation; and Acute pain of right knee on their problem list..   He  has a past medical history of Carotid artery disease ([place]), Former smoker (12/21/2015), Hyperlipemia, Kidney stones, Left carotid bruit (08/05/2021), Obesity, and Paroxysmal atrial flutter ([place]) (05/29/2021).Richard Vazquez   He presents with chief complaint of Follow-up (Follow up on possible hematoma on right knee. ) .   Knee Pain:  Update 06/15/22 Patient reports that he has had complete resolution of pain.  Right knee x-ray obtained at prior visit showed no acute fracture.  Patient is not taking any medications.  He is walking at his job without any issue.  05/16/2022 Patient presents with a knee injury involving the  right knee. Onset of the symptoms was yesterday. 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 Current symptoms include pain located anterior knee . Pain is aggravated by  extension and flexion .  Patient has had no prior knee problems. Evaluation to date: none. Treatment to date: OTC analgesics which are somewhat effective.   Past Surgical History:  Procedure Laterality Date   A-FLUTTER ABLATION N/A 01/16/2022   Procedure: A-FLUTTER ABLATION;  Surgeon: Deboraha Sprang, MD;  Location: [place] [facility];  Service: Cardiovascular;  Laterality: N/A;   APPENDECTOMY     CARDIOVERSION N/A 05/31/2021   Procedure: CARDIOVERSION;   Surgeon: [name], MD;  Location: [facility] ENDOSCOPY;  Service: Cardiovascular;  Laterality: N/A;   CARDIOVERSION N/A 10/05/2021   Procedure: CARDIOVERSION;  Surgeon: [name], MD;  Location: [place];  Service: Cardiovascular;  Laterality: N/A;   LEFT HEART CATH AND CORONARY ANGIOGRAPHY N/A 12/07/2021   Procedure: LEFT HEART CATH AND CORONARY ANGIOGRAPHY;  Surgeon: [name], MD;  Location: [place] [facility];  Service: Cardiovascular;  Laterality: N/A;   TEE WITHOUT CARDIOVERSION N/A 05/31/2021   Procedure: TRANSESOPHAGEAL ECHOCARDIOGRAM (TEE);  Surgeon: [name], MD;  Location: [facility] ENDOSCOPY;  Service: Cardiovascular;  Laterality: N/A;    Outpatient Medications Prior to Visit  Medication Sig Dispense Refill   apixaban (ELIQUIS) 5 MG TABS tablet Take 1 tablet (5 mg total) by mouth 2 (two) times daily. 180 tablet 3   b complex vitamins capsule Take 1 capsule by mouth daily.     diclofenac Sodium (VOLTAREN) 1 % GEL Apply 4 g topically 4 (four) times daily as needed. 100 g 3   diltiazem (CARDIZEM CD) 180 MG 24 hr capsule Take 1 capsule (180 mg total) by mouth daily. 90 capsule 3   flecainide (TAMBOCOR) 100 MG tablet Take 1 tablet (100 mg total) by mouth 2 (two) times daily. 180 tablet 3   indapamide (LOZOL) 2.5 MG tablet Take 2.5 mg by mouth daily.     Iron, Ferrous Sulfate, 325 (65 Fe) MG TABS Take 325 mg by mouth every other day.     Multiple Vitamin (MULTIVITAMIN) tablet Take 1 tablet by mouth daily.  nitroGLYCERIN (NITROSTAT) 0.4 MG SL tablet Place 1 tablet (0.4 mg total) under the tongue every 5 (five) minutes as needed for chest pain (Max 3 doses). 20 tablet 2   Omega-3 1000 MG CAPS Take 1,000 mg by mouth daily.     polyethylene glycol (MIRALAX / GLYCOLAX) 17 g packet Take 17 g by mouth daily.     rosuvastatin (CRESTOR) 20 MG tablet Take 1 tablet (20 mg total) by mouth daily. 90 tablet 3   No facility-administered medications prior to visit.    No family  history on file.  Social History   Socioeconomic History   Marital status: Married    Spouse name: Not on file   Number of children: Not on file   Years of education: Not on file   Highest education level: Not on file  Occupational History   Not on file  Tobacco Use   Smoking status: Former    Packs/day: 1.50    Years: 40.00    Total pack years: 60.00    Types: Cigarettes    Quit date: 12/09/2019    Years since quitting: 2.5    Passive exposure: Past   Smokeless tobacco: Never  Vaping Use   Vaping Use: Never used  Substance and Sexual Activity   Alcohol use: Not Currently   Drug use: Never   Sexual activity: Not Currently  Other Topics Concern   Not on file  Social History Narrative   Not on file   Social Determinants of Health   Financial Resource Strain: Low Risk  (04/11/2022)   Overall Financial Resource Strain (CARDIA)    Difficulty of Paying Living Expenses: Not hard at all  Food Insecurity: No Food Insecurity (04/11/2022)   Hunger Vital Sign    Worried About Running Out of Food in the Last Year: Never true    [place] in the Last Year: Never true  Transportation Needs: No Transportation Needs (04/11/2022)   PRAPARE - Hydrologist (Medical): No    Lack of Transportation (Non-Medical): No  Physical Activity: Inactive (04/11/2022)   Exercise Vital Sign    Days of Exercise per Week: 0 days    Minutes of Exercise per Session: 0 min  Stress: No Stress Concern Present (04/11/2022)   [place]    Feeling of Stress : Not at all  Social Connections: Moderately Isolated (04/11/2022)   Social Connection and Isolation Panel [NHANES]    Frequency of Communication with Friends and Family: Three times a week    Frequency of Social Gatherings with Friends and Family: Three times a week    Attends Religious Services: Never    Active Member of Clubs or Organizations: No     Attends Archivist Meetings: Never    Marital Status: Married  Human resources officer Violence: Not At Risk (04/11/2022)   Humiliation, Afraid, Rape, and Kick questionnaire    Fear of Current or Ex-Partner: No    Emotionally Abused: No    Physically Abused: No    Sexually Abused: No  Objective:  Physical Exam: BP 132/84 (BP Location: Left Arm, Patient Position: Sitting, Cuff Size: Large)   Pulse 70   Temp 97.8 F (36.6 C) (Temporal)   Wt 273 lb 9.6 oz (124.1 kg)   SpO2 97%   BMI 34.20 kg/m    General: No acute distress. Awake and conversant.  Eyes: Normal conjunctiva, anicteric. Round symmetric pupils.  ENT: Hearing grossly intact. No nasal discharge.  Neck: Neck is supple. No masses or thyromegaly.  Respiratory: Respirations are non-labored. No auditory wheezing.  Skin: Warm. No rashes or ulcers.  Psych: Alert and oriented. Cooperative, Appropriate mood and affect, Normal judgment.  CV: No cyanosis or JVD MSK: Normal ambulation. No clubbing  Knee Musculoskeletal Exam Gait   Gait is normal.  Inspection   Leg length disparity: no discrepancy   Right     Right knee inspection is normal.    Palpation   Right     Right knee palpation is unremarkable.    Range of Motion   Right     Right knee range of motion is normal.    Strength   Right     Right knee strength is normal.    Instability   Right     Instability signs: none - stable     Varus stress grade: normal     Valgus stress grade: normal     Anterior drawer: normal     Posterior drawer: normal   Neuro: Sensation and CN II-XII grossly normal.        Alesia Banda, MD, MS

## 2022-06-19 DIAGNOSIS — N200 Calculus of kidney: Secondary | ICD-10-CM | POA: Diagnosis not present

## 2022-06-19 DIAGNOSIS — Z87891 Personal history of nicotine dependence: Secondary | ICD-10-CM | POA: Diagnosis not present

## 2022-06-19 DIAGNOSIS — N210 Calculus in bladder: Secondary | ICD-10-CM | POA: Diagnosis not present

## 2022-06-20 ENCOUNTER — Telehealth: Payer: Self-pay | Admitting: Internal Medicine

## 2022-06-20 NOTE — Telephone Encounter (Signed)
Pt advised to follow up with urology first about this being that stent placed yesterday and unsure if this is normal post procedure. Advised to have them reach out to [place] if needed. Patient verbalized understanding and agreeable to plan.

## 2022-06-20 NOTE — Telephone Encounter (Signed)
Pt c/o medication issue:  1. Name of Medication:  apixaban (ELIQUIS) 5 MG TABS tablet  2. How are you currently taking this medication (dosage and times per day)? Take 1 tablet (5 mg total) by mouth 2 (two) times daily.  3. Are you having a reaction (difficulty breathing--STAT)? no  4. What is your medication issue? Patient had a kidney stent done yesterday, he was advise to restart at 3:10am this morning (as he works night shift).  He states he urine is very bloody, it's a bright red.   He is wondering if he can hold off taking Eliquis, if so for how long he can hold it for.

## 2022-06-21 ENCOUNTER — Other Ambulatory Visit: Payer: Self-pay | Admitting: Physician Assistant

## 2022-06-21 DIAGNOSIS — I4819 Other persistent atrial fibrillation: Secondary | ICD-10-CM

## 2022-06-21 NOTE — Telephone Encounter (Signed)
Pt c/o medication issue:  1. Name of Medication:  apixaban (ELIQUIS) 5 MG TABS tablet  2. How are you currently taking this medication (dosage and times per day)?   3. Are you having a reaction (difficulty breathing--STAT)?   4. What is your medication issue?   Patient is following up. He states he has not taken Eliquis since Friday, 10/20 due to stent placement. He states his urologist advised to continue holding it because he is scheduled for a lithotripsy on Monday, 10/30. Patient would like to confirm whether cardiology is agreeable with continuing to hold Eliquis and if so, how long can he hold it?

## 2022-06-21 NOTE — Telephone Encounter (Signed)
Prescription refill request for Eliquis received. Indication: Afib/flutter Last office visit: 06/08/22 Radford Pax)  Scr: 1.00 (01/13/22)  Age: 66 Weight: 124.1kg  Appropriate dose and refill sent to requested pharmacy.

## 2022-06-22 ENCOUNTER — Encounter: Payer: Self-pay | Admitting: Internal Medicine

## 2022-06-22 NOTE — Telephone Encounter (Signed)
I think I wrote a note for this already Need to hold Eliquis per urology given bleeding  Risk is low but weighing risk/benefit of holding  vs taking     No hx of CVA in past

## 2022-06-22 NOTE — Telephone Encounter (Signed)
Probably low risk   Would follow what urology reocmm for now   Resume when OK from urologic standpoint

## 2022-06-26 DIAGNOSIS — N200 Calculus of kidney: Secondary | ICD-10-CM | POA: Diagnosis not present

## 2022-06-26 NOTE — Telephone Encounter (Signed)
Pt having his procedure today I have sent him a My chart with this information.

## 2022-06-29 DIAGNOSIS — H524 Presbyopia: Secondary | ICD-10-CM | POA: Diagnosis not present

## 2022-06-30 DIAGNOSIS — S37012A Minor contusion of left kidney, initial encounter: Secondary | ICD-10-CM | POA: Diagnosis not present

## 2022-06-30 DIAGNOSIS — R059 Cough, unspecified: Secondary | ICD-10-CM | POA: Diagnosis not present

## 2022-06-30 DIAGNOSIS — I4819 Other persistent atrial fibrillation: Secondary | ICD-10-CM | POA: Diagnosis not present

## 2022-06-30 DIAGNOSIS — E6609 Other obesity due to excess calories: Secondary | ICD-10-CM | POA: Diagnosis not present

## 2022-06-30 DIAGNOSIS — N99820 Postprocedural hemorrhage and hematoma of a genitourinary system organ or structure following a genitourinary system procedure: Secondary | ICD-10-CM | POA: Diagnosis not present

## 2022-06-30 DIAGNOSIS — A419 Sepsis, unspecified organism: Secondary | ICD-10-CM | POA: Diagnosis not present

## 2022-06-30 DIAGNOSIS — N12 Tubulo-interstitial nephritis, not specified as acute or chronic: Secondary | ICD-10-CM | POA: Diagnosis not present

## 2022-06-30 DIAGNOSIS — I97418 Intraoperative hemorrhage and hematoma of a circulatory system organ or structure complicating other circulatory system procedure: Secondary | ICD-10-CM | POA: Diagnosis not present

## 2022-06-30 DIAGNOSIS — K683 Retroperitoneal hematoma: Secondary | ICD-10-CM | POA: Diagnosis not present

## 2022-06-30 DIAGNOSIS — R6520 Severe sepsis without septic shock: Secondary | ICD-10-CM | POA: Diagnosis not present

## 2022-06-30 DIAGNOSIS — I7143 Infrarenal abdominal aortic aneurysm, without rupture: Secondary | ICD-10-CM | POA: Diagnosis not present

## 2022-06-30 DIAGNOSIS — N170 Acute kidney failure with tubular necrosis: Secondary | ICD-10-CM | POA: Diagnosis not present

## 2022-06-30 DIAGNOSIS — J9690 Respiratory failure, unspecified, unspecified whether with hypoxia or hypercapnia: Secondary | ICD-10-CM | POA: Diagnosis not present

## 2022-06-30 DIAGNOSIS — G4733 Obstructive sleep apnea (adult) (pediatric): Secondary | ICD-10-CM | POA: Diagnosis not present

## 2022-06-30 DIAGNOSIS — J9601 Acute respiratory failure with hypoxia: Secondary | ICD-10-CM | POA: Diagnosis not present

## 2022-06-30 DIAGNOSIS — N200 Calculus of kidney: Secondary | ICD-10-CM | POA: Diagnosis not present

## 2022-06-30 DIAGNOSIS — R0602 Shortness of breath: Secondary | ICD-10-CM | POA: Diagnosis not present

## 2022-06-30 DIAGNOSIS — D509 Iron deficiency anemia, unspecified: Secondary | ICD-10-CM | POA: Diagnosis not present

## 2022-06-30 DIAGNOSIS — R319 Hematuria, unspecified: Secondary | ICD-10-CM | POA: Diagnosis not present

## 2022-06-30 DIAGNOSIS — N132 Hydronephrosis with renal and ureteral calculous obstruction: Secondary | ICD-10-CM | POA: Diagnosis not present

## 2022-06-30 DIAGNOSIS — N390 Urinary tract infection, site not specified: Secondary | ICD-10-CM | POA: Diagnosis not present

## 2022-06-30 DIAGNOSIS — E876 Hypokalemia: Secondary | ICD-10-CM | POA: Diagnosis not present

## 2022-06-30 DIAGNOSIS — R109 Unspecified abdominal pain: Secondary | ICD-10-CM | POA: Diagnosis not present

## 2022-06-30 DIAGNOSIS — J90 Pleural effusion, not elsewhere classified: Secondary | ICD-10-CM | POA: Diagnosis not present

## 2022-06-30 DIAGNOSIS — R1084 Generalized abdominal pain: Secondary | ICD-10-CM | POA: Diagnosis not present

## 2022-06-30 DIAGNOSIS — Z960 Presence of urogenital implants: Secondary | ICD-10-CM | POA: Diagnosis not present

## 2022-06-30 DIAGNOSIS — J811 Chronic pulmonary edema: Secondary | ICD-10-CM | POA: Diagnosis not present

## 2022-06-30 DIAGNOSIS — S37019A Minor contusion of unspecified kidney, initial encounter: Secondary | ICD-10-CM | POA: Diagnosis not present

## 2022-06-30 DIAGNOSIS — A4189 Other specified sepsis: Secondary | ICD-10-CM | POA: Diagnosis not present

## 2022-06-30 DIAGNOSIS — N136 Pyonephrosis: Secondary | ICD-10-CM | POA: Diagnosis not present

## 2022-06-30 DIAGNOSIS — Z6835 Body mass index (BMI) 35.0-35.9, adult: Secondary | ICD-10-CM | POA: Diagnosis not present

## 2022-06-30 DIAGNOSIS — N179 Acute kidney failure, unspecified: Secondary | ICD-10-CM | POA: Diagnosis not present

## 2022-06-30 DIAGNOSIS — J9811 Atelectasis: Secondary | ICD-10-CM | POA: Diagnosis not present

## 2022-06-30 DIAGNOSIS — D62 Acute posthemorrhagic anemia: Secondary | ICD-10-CM | POA: Diagnosis not present

## 2022-06-30 DIAGNOSIS — I4892 Unspecified atrial flutter: Secondary | ICD-10-CM | POA: Diagnosis not present

## 2022-07-06 ENCOUNTER — Encounter: Payer: Self-pay | Admitting: Internal Medicine

## 2022-07-10 DIAGNOSIS — N200 Calculus of kidney: Secondary | ICD-10-CM | POA: Diagnosis not present

## 2022-07-24 DIAGNOSIS — N200 Calculus of kidney: Secondary | ICD-10-CM | POA: Diagnosis not present

## 2022-08-01 ENCOUNTER — Telehealth: Payer: Self-pay | Admitting: Internal Medicine

## 2022-08-01 DIAGNOSIS — R0989 Other specified symptoms and signs involving the circulatory and respiratory systems: Secondary | ICD-10-CM

## 2022-08-01 NOTE — Telephone Encounter (Signed)
Pt is scheduled to have test done on 08/31/22 but needs a later date due to a conflict. Pt would like to know if order can be extended so that he is able to reschedule beyond 08/31/22. Please advise

## 2022-08-01 NOTE — Telephone Encounter (Signed)
Appt changed and order updated per pt request.   Pt says he had Lithotripsy and then an emergent surgery for a post complication so he has been off of his Eliquis since 06/11/22... his surgeon asked him to reach out to Cardio and see when to restart. He is just getting around to asking about it now... will forward to Dr Tenny Craw to review.

## 2022-08-02 ENCOUNTER — Encounter: Payer: Self-pay | Admitting: Internal Medicine

## 2022-08-02 NOTE — Telephone Encounter (Signed)
Resume as soon as safe from surgical standpoint

## 2022-08-03 NOTE — Telephone Encounter (Signed)
Please see other open encounter

## 2022-08-07 ENCOUNTER — Telehealth: Payer: Self-pay | Admitting: Internal Medicine

## 2022-08-07 NOTE — Telephone Encounter (Signed)
Pt reports tat he tried going back on the Eliquis but he started bleeding form his urinary tract again... bright red blood.. he spoke with Nephrology and they are having him hold it again... I made him an appt with Dr Tenny Craw and he will let us know if anything changes... he says he has his apple watch and he will monitor his heart rhythm.   I will forward to Dr Linus Orn for her review.

## 2022-08-07 NOTE — Telephone Encounter (Signed)
I spoke with the pt and made him a follow up apt for 09/25/22... see phone note from his call.

## 2022-08-07 NOTE — Telephone Encounter (Signed)
Asking that the nurse gives him a call. Please advise

## 2022-08-09 DIAGNOSIS — R310 Gross hematuria: Secondary | ICD-10-CM | POA: Diagnosis not present

## 2022-08-10 DIAGNOSIS — N1330 Unspecified hydronephrosis: Secondary | ICD-10-CM | POA: Diagnosis not present

## 2022-08-10 DIAGNOSIS — I7143 Infrarenal abdominal aortic aneurysm, without rupture: Secondary | ICD-10-CM | POA: Diagnosis not present

## 2022-08-10 DIAGNOSIS — N200 Calculus of kidney: Secondary | ICD-10-CM | POA: Diagnosis not present

## 2022-08-10 DIAGNOSIS — K661 Hemoperitoneum: Secondary | ICD-10-CM | POA: Diagnosis not present

## 2022-08-10 DIAGNOSIS — I2510 Atherosclerotic heart disease of native coronary artery without angina pectoris: Secondary | ICD-10-CM | POA: Diagnosis not present

## 2022-08-17 ENCOUNTER — Encounter: Payer: Self-pay | Admitting: Internal Medicine

## 2022-08-18 ENCOUNTER — Other Ambulatory Visit: Payer: Self-pay | Admitting: *Deleted

## 2022-08-18 MED ORDER — ROSUVASTATIN CALCIUM 20 MG PO TABS: 20.0000 mg | ORAL_TABLET | Freq: Every day | ORAL | 3 refills | Status: DC

## 2022-08-26 DIAGNOSIS — G4733 Obstructive sleep apnea (adult) (pediatric): Secondary | ICD-10-CM | POA: Diagnosis not present

## 2022-08-30 DIAGNOSIS — N400 Enlarged prostate without lower urinary tract symptoms: Secondary | ICD-10-CM | POA: Diagnosis not present

## 2022-08-30 DIAGNOSIS — N12 Tubulo-interstitial nephritis, not specified as acute or chronic: Secondary | ICD-10-CM | POA: Diagnosis not present

## 2022-08-30 DIAGNOSIS — Z87442 Personal history of urinary calculi: Secondary | ICD-10-CM | POA: Diagnosis not present

## 2022-08-30 DIAGNOSIS — N200 Calculus of kidney: Secondary | ICD-10-CM | POA: Diagnosis not present

## 2022-08-31 ENCOUNTER — Encounter (HOSPITAL_COMMUNITY): Payer: Medicare Other

## 2022-09-01 ENCOUNTER — Ambulatory Visit (HOSPITAL_COMMUNITY)
Admission: RE | Admit: 2022-09-01 | Discharge: 2022-09-01 | Disposition: A | Discharge status: Home / Self Care | Payer: Medicare Other | Source: Ambulatory Visit | Attending: Cardiology | Admitting: Cardiology

## 2022-09-01 DIAGNOSIS — R0989 Other specified symptoms and signs involving the circulatory and respiratory systems: Secondary | ICD-10-CM | POA: Insufficient documentation

## 2022-09-03 ENCOUNTER — Encounter: Payer: Self-pay | Admitting: Physician Assistant

## 2022-09-04 ENCOUNTER — Telehealth: Payer: Self-pay | Admitting: *Deleted

## 2022-09-04 DIAGNOSIS — R0989 Other specified symptoms and signs involving the circulatory and respiratory systems: Secondary | ICD-10-CM

## 2022-09-04 DIAGNOSIS — I779 Disorder of arteries and arterioles, unspecified: Secondary | ICD-10-CM

## 2022-09-04 NOTE — Telephone Encounter (Signed)
-----   Message from Liliane Shi, [place] sent at 09/03/2022  3:56 PM EST ----- Results sent to [name] via [place]. See MyChart comment. I have sent a copy to his PCP PLAN: -Repeat carotid US in 1 year Richardson Dopp, [place]    09/03/2022 3:53 PM

## 2022-09-15 IMAGING — DX DG CHEST 1V PORT
1 series · 1 of 1 positions shown · non-contrast
Comparison: None.

CLINICAL DATA: Shortness of breath and palpitations for 1 week.

EXAM:
PORTABLE CHEST 1 VIEW

[chest ap]
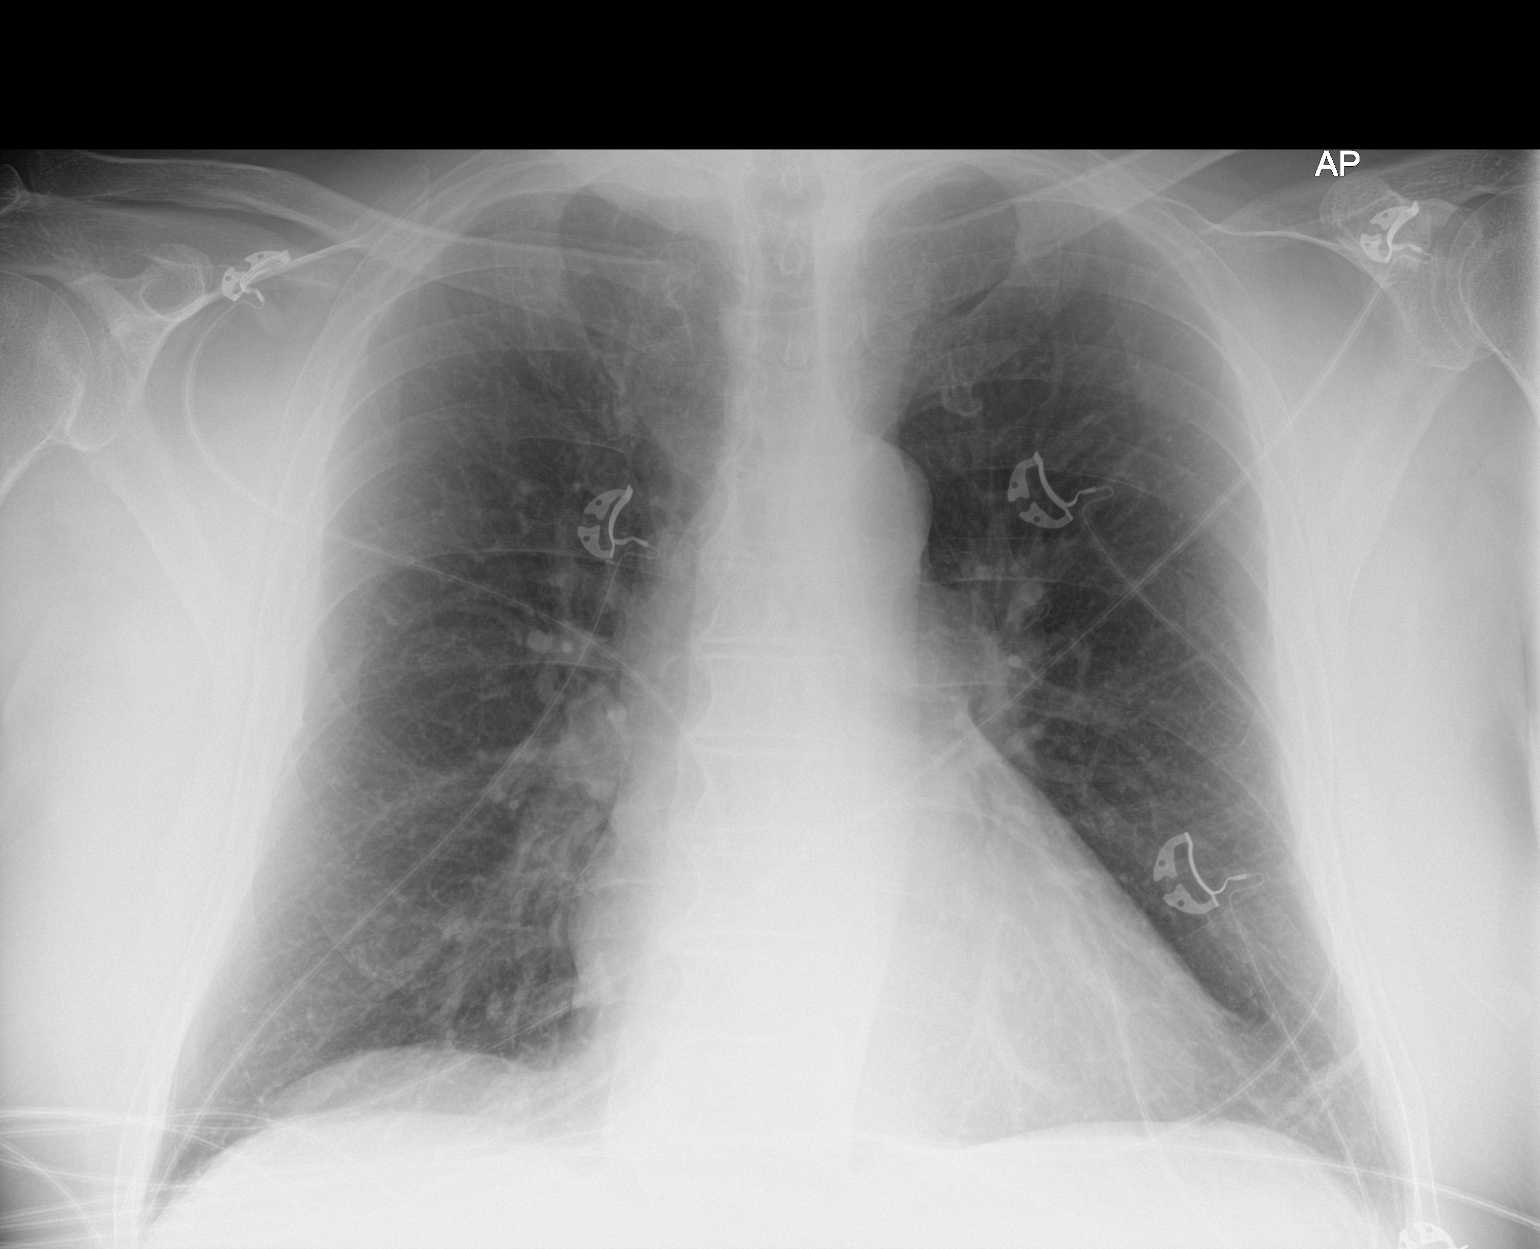

[1 of 1 positions shown; findings below may reference images not displayed]

FINDINGS: The heart size and mediastinal contours are within normal limits.
There is mild left basilar atelectasis. The right lung is clear.
There is no pleural effusion or pneumothorax. Degenerative changes
are seen in the spine.
IMPRESSION: No active disease.

## 2022-09-25 ENCOUNTER — Ambulatory Visit: Payer: Medicare Other | Attending: Internal Medicine | Admitting: Internal Medicine

## 2022-09-25 ENCOUNTER — Encounter: Payer: Self-pay | Admitting: Internal Medicine

## 2022-09-25 VITALS — BP 130/72 | HR 70 | Ht 75.0 in | Wt 281.6 lb

## 2022-09-25 DIAGNOSIS — I4819 Other persistent atrial fibrillation: Secondary | ICD-10-CM | POA: Diagnosis not present

## 2022-09-25 DIAGNOSIS — Z79899 Other long term (current) drug therapy: Secondary | ICD-10-CM | POA: Diagnosis not present

## 2022-09-25 DIAGNOSIS — I779 Disorder of arteries and arterioles, unspecified: Secondary | ICD-10-CM

## 2022-09-25 DIAGNOSIS — E785 Hyperlipidemia, unspecified: Secondary | ICD-10-CM | POA: Diagnosis not present

## 2022-09-25 DIAGNOSIS — R7303 Prediabetes: Secondary | ICD-10-CM

## 2022-09-25 MED ORDER — APIXABAN 5 MG PO TABS: 5.0000 mg | ORAL_TABLET | Freq: Two times a day (BID) | ORAL | 3 refills | Status: DC

## 2022-09-25 MED ORDER — NITROGLYCERIN 0.4 MG SL SUBL: 0.4000 mg | SUBLINGUAL_TABLET | SUBLINGUAL | 2 refills | Status: DC | PRN

## 2022-09-25 NOTE — Progress Notes (Signed)
Cardiology Office Note   Date:  09/25/2022   ID:  Richard Vazquez, DOB May 14, 1956, MRN [id number]  PCP:  Bonnita Hollow, MD  Cardiologist:   Dorris Carnes, MD    Patient presents for follow-up of atrial flutter.   History of Present Illness:  Mr. Richard Vazquez is a 67 year old gentleman with a history of cerebrovascular disease, atrial flutter.First presented in 2022  Had TEE cardioversion    2023 had recurrence of atrial flutter  Cardioversion     I saw the pt in March 2023 Cardiac cath in April 2023 showed mild nonobstructive CAD   He underwent ablatipn of atrial flutter in May 2023  There were multiple paths noted at EP study  The one stable path was ablatedUnforturnatly he had recurrence   Went onto flecanide then went on to cardioversion   Currently on flecanide and eliquis      He has been seen by PAs and Olin Pia since  The pt had kidney stones at end of yearUnderwent lithotripsy  Complicated by hematoma   Went off of anticoagulation   Just restarted on Sep 16, 2022  The pt denies CP   Breathing is OK  No palpitations     No dizziness Took Vit d 50000 weekly  Has finished     Current Meds  Medication Sig   b complex vitamins capsule Take 1 capsule by mouth daily.   bisacodyl (DULCOLAX) 5 MG EC tablet Take by mouth.   diclofenac Sodium (VOLTAREN) 1 % GEL Apply 4 g topically 4 (four) times daily as needed.   diltiazem (CARDIZEM CD) 180 MG 24 hr capsule TAKE ONE CAPSULE BY MOUTH DAILY   flecainide (TAMBOCOR) 100 MG tablet Take 1 tablet (100 mg total) by mouth 2 (two) times daily.   indapamide (LOZOL) 2.5 MG tablet Take 2.5 mg by mouth daily.   Iron, Ferrous Sulfate, 325 (65 Fe) MG TABS Take 325 mg by mouth every other day.   Multiple Vitamin (MULTIVITAMIN) tablet Take 1 tablet by mouth daily.   Omega-3 1000 MG CAPS Take 1,000 mg by mouth daily.   polyethylene glycol (MIRALAX / GLYCOLAX) 17 g packet Take 17 g by mouth daily.   rosuvastatin (CRESTOR) 20 MG tablet Take 1 tablet (20 mg total)  by mouth daily.   tamsulosin (FLOMAX) 0.4 MG CAPS capsule Take 1 capsule by mouth daily.   [DISCONTINUED] apixaban (ELIQUIS) 5 MG TABS tablet TAKE ONE TABLET BY MOUTH TWICE A DAY   [DISCONTINUED] nitroGLYCERIN (NITROSTAT) 0.4 MG SL tablet Place 1 tablet (0.4 mg total) under the tongue every 5 (five) minutes as needed for chest pain (Max 3 doses).     Allergies:   Lipitor [atorvastatin], Codeine, and Latex   Past Medical History:  Diagnosis Date   Carotid artery disease ([place])    Carotid US 09/01/2022: R 40-59, L 1-39   Former smoker 12/21/2015   Hyperlipemia    Kidney stones    Left carotid bruit 08/05/2021   Obesity    Paroxysmal atrial flutter ([place]) 05/29/2021    Past Surgical History:  Procedure Laterality Date   A-FLUTTER ABLATION N/A 01/16/2022   Procedure: A-FLUTTER ABLATION;  Surgeon: Deboraha Sprang, MD;  Location: [place] [facility];  Service: Cardiovascular;  Laterality: N/A;   APPENDECTOMY     CARDIOVERSION N/A 05/31/2021   Procedure: CARDIOVERSION;  Surgeon: [name], MD;  Location: [facility] ENDOSCOPY;  Service: Cardiovascular;  Laterality: N/A;   CARDIOVERSION N/A 10/05/2021   Procedure: CARDIOVERSION;  Surgeon: [name], MD;  Location: [place];  Service: Cardiovascular;  Laterality: N/A;   LEFT HEART CATH AND CORONARY ANGIOGRAPHY N/A 12/07/2021   Procedure: LEFT HEART CATH AND CORONARY ANGIOGRAPHY;  Surgeon: [name], MD;  Location: [place] [facility];  Service: Cardiovascular;  Laterality: N/A;   TEE WITHOUT CARDIOVERSION N/A 05/31/2021   Procedure: TRANSESOPHAGEAL ECHOCARDIOGRAM (TEE);  Surgeon: [name], MD;  Location[facility] ENDOSCOPY;  Service: Cardiovascular;  Laterality: N/A;     Social History:  The patient  reports that he quit smoking about 2 years ago. His smoking use included cigarettes. He has a 60.00 pack-year smoking history. He has been exposed to tobacco smoke. He has never used smokeless tobacco. He reports that he does not  currently use alcohol. He reports that he does not use drugs.   Family History:  The patient's family history is not on file.    ROS:  Please see the history of present illness. All other systems are reviewed and  Negative to the above problem except as noted.    PHYSICAL EXAM: VS:  BP 130/72   Pulse 70   Ht 6\' 3"  (1.905 m)   Wt 281 lb 9.6 oz (127.7 kg)   SpO2 96%   BMI 35.20 kg/m   GEN: Obese 67 year old in no acute distress  HEENT: normal  Neck: no JVD, carotid bruit Cardiac: RRR  Normal S1, S2,  no S3  No significant murmurs     1+ LE edema  Respiratory:  clear to auscultation bilaterally,  GI: Distended.  Soft.  Nontender.   MS: no deformity Moving all extremities   Skin: warm and dry, no rash Neuro:  Strength and sensation are intact Psych: euthymic mood, full affect   EKG:  EKG is ordered today. SR  66 bpm   First degree AV block  PR 228 msec     Lipid Panel    Component Value Date/Time   CHOL 111 12/06/2021 0230   CHOL 183 08/05/2021 0924   TRIG 187 (H) 12/06/2021 0230   HDL 34 (L) 12/06/2021 0230   HDL 36 (L) 08/05/2021 0924   CHOLHDL 3.3 12/06/2021 0230   VLDL 37 12/06/2021 0230   LDLCALC 40 12/06/2021 0230   LDLCALC 126 (H) 08/05/2021 0924      Wt Readings from Last 3 Encounters:  09/25/22 281 lb 9.6 oz (127.7 kg)  06/15/22 273 lb 9.6 oz (124.1 kg)  06/08/22 280 lb (127 kg)      ASSESSMENT AND PLAN:  1.  Atrial flutter.  Pt s/p ablation  Then recurrence   Back on Elqius and Flecanide  Doing well  Remains in SR    2 CV disease.  Patient with moderate plaquing on the left common carotid.  This will need to be followed.  Keep on statin  3. HL   Keep on statin  Excellent control  4  Kidney stones   Had hemotoma after recent lithotripsy   Finally back on Eliquis   Check CBC   5  metabolics  Discussed diet and A1C   Will recheck      Will check:  CBC,BMET, BNP, Vit D, A1C     F/U in Aug 2024   Current medicines are reviewed at length with the  patient today.  The patient does not have concerns regarding medicines.  Signed, Dorris Carnes, MD  09/25/2022 2:33 PM    [place], [place]  [address] Phone: [phone]; Fax: [phone]

## 2022-09-25 NOTE — Patient Instructions (Signed)
Medication Instructions:   *If you need a refill on your cardiac medications before your next appointment, please call your pharmacy*   Lab Work: Bmet, cbc, pro bnp, hgba1c, vit d  If you have labs (blood work) drawn today and your tests are completely normal, you will receive your results only by: [place] (if you have MyChart) OR A paper copy in the mail If you have any lab test that is abnormal or we need to change your treatment, we will call you to review the results.   Testing/Procedures:    Follow-Up: At [facility], you and your health needs are our priority.  As part of our continuing mission to provide you with exceptional heart care, we have created designated Provider Care Teams.  These Care Teams include your primary Cardiologist (physician) and Advanced Practice Providers (APPs -  Physician Assistants and Nurse Practitioners) who all work together to provide you with the care you need, when you need it.  We recommend signing up for the patient portal called "MyChart".  Sign up information is provided on this After Visit Summary.  MyChart is used to connect with patients for Virtual Visits (Telemedicine).  Patients are able to view lab/test results, encounter notes, upcoming appointments, etc.  Non-urgent messages can be sent to your provider as well.   To learn more about what you can do with MyChart, go to [email or web address].    Your next appointment:   7 month(s)  Provider:   Dorris Carnes, MD     Other Instructions

## 2022-09-26 DIAGNOSIS — G4733 Obstructive sleep apnea (adult) (pediatric): Secondary | ICD-10-CM | POA: Diagnosis not present

## 2022-09-26 LAB — CBC
Hematocrit: 40.7 % (ref 37.5–51.0)
Hemoglobin: 13.3 g/dL (ref 13.0–17.7)
MCH: 27.7 pg (ref 26.6–33.0)
MCHC: 32.7 g/dL (ref 31.5–35.7)
MCV: 85 fL (ref 79–97)
Platelets: 275 10*3/uL (ref 150–450)
RBC: 4.81 x10E6/uL (ref 4.14–5.80)
RDW: 15.3 % (ref 11.6–15.4)
WBC: 9.7 10*3/uL (ref 3.4–10.8)

## 2022-09-26 LAB — VITAMIN D 25 HYDROXY (VIT D DEFICIENCY, FRACTURES): Vit D, 25-Hydroxy: 60.8 ng/mL (ref 30.0–100.0)

## 2022-09-26 LAB — BASIC METABOLIC PANEL
BUN/Creatinine Ratio: 19 (ref 10–24)
BUN: 20 mg/dL (ref 8–27)
CO2: 25 mmol/L (ref 20–29)
Calcium: 9.6 mg/dL (ref 8.6–10.2)
Chloride: 101 mmol/L (ref 96–106)
Creatinine, Ser: 1.05 mg/dL (ref 0.76–1.27)
Glucose: 136 mg/dL — ABNORMAL HIGH (ref 70–99)
Potassium: 4.2 mmol/L (ref 3.5–5.2)
Sodium: 141 mmol/L (ref 134–144)
eGFR: 78 mL/min/{1.73_m2} (ref >59)

## 2022-09-26 LAB — HEMOGLOBIN A1C
Est. average glucose Bld gHb Est-mCnc: 134 mg/dL
Hgb A1c MFr Bld: 6.3 % — ABNORMAL HIGH (ref 4.8–5.6)

## 2022-09-26 LAB — PRO B NATRIURETIC PEPTIDE: NT-Pro BNP: 58 pg/mL (ref 0–376)

## 2022-10-26 DIAGNOSIS — G4733 Obstructive sleep apnea (adult) (pediatric): Secondary | ICD-10-CM | POA: Diagnosis not present

## 2022-11-25 DIAGNOSIS — G4733 Obstructive sleep apnea (adult) (pediatric): Secondary | ICD-10-CM | POA: Diagnosis not present

## 2022-12-18 ENCOUNTER — Ambulatory Visit (INDEPENDENT_AMBULATORY_CARE_PROVIDER_SITE_OTHER): Payer: Medicare Other | Admitting: Family Medicine

## 2022-12-18 ENCOUNTER — Encounter: Payer: Self-pay | Admitting: Family Medicine

## 2022-12-18 VITALS — BP 138/86 | HR 74 | Temp 97.6°F | Wt 274.8 lb

## 2022-12-18 DIAGNOSIS — R21 Rash and other nonspecific skin eruption: Secondary | ICD-10-CM | POA: Insufficient documentation

## 2022-12-18 DIAGNOSIS — I779 Disorder of arteries and arterioles, unspecified: Secondary | ICD-10-CM | POA: Diagnosis not present

## 2022-12-18 DIAGNOSIS — Z122 Encounter for screening for malignant neoplasm of respiratory organs: Secondary | ICD-10-CM

## 2022-12-18 DIAGNOSIS — E559 Vitamin D deficiency, unspecified: Secondary | ICD-10-CM | POA: Diagnosis not present

## 2022-12-18 DIAGNOSIS — Z87891 Personal history of nicotine dependence: Secondary | ICD-10-CM

## 2022-12-18 DIAGNOSIS — R600 Localized edema: Secondary | ICD-10-CM | POA: Diagnosis not present

## 2022-12-18 DIAGNOSIS — R0609 Other forms of dyspnea: Secondary | ICD-10-CM | POA: Diagnosis not present

## 2022-12-18 DIAGNOSIS — I4892 Unspecified atrial flutter: Secondary | ICD-10-CM

## 2022-12-18 DIAGNOSIS — I809 Phlebitis and thrombophlebitis of unspecified site: Secondary | ICD-10-CM

## 2022-12-18 MED ORDER — HYDROXYZINE HCL 25 MG PO TABS
12.5000 mg | ORAL_TABLET | Freq: Three times a day (TID) | ORAL | 0 refills | Status: DC | PRN
Start: 2022-12-18 — End: 2023-03-09

## 2022-12-18 MED ORDER — TRIAMCINOLONE ACETONIDE 0.1 % EX CREA
1.0000 | TOPICAL_CREAM | Freq: Two times a day (BID) | CUTANEOUS | 0 refills | Status: DC
Start: 2022-12-18 — End: 2023-06-19

## 2022-12-18 NOTE — Assessment & Plan Note (Signed)
Schedule ultrasound imaging for the lower legs to rule out DVT and assess for other blood flow issues. Continue monitoring and manage any contributing factors such as heart conditions or venous insufficiency.

## 2022-12-18 NOTE — Assessment & Plan Note (Signed)
Likely related to poorly controlled atrial fibrillation. Continue management of atrial fibrillation with current medications and closely monitor symptoms. Screen for heart failure, electrolyte abnormalities, anemia Return and ED precautions discussed.

## 2022-12-18 NOTE — Patient Instructions (Addendum)
For rash, you may apply triamcinolone ointment as prescribed and take hydroxyzine as needed for itching.  Please follow-up if no improvement in rash. For lower extremity swelling, we are ordering ultrasound. For shortness of breath, we are checking labs. For lung cancer screening, we ordered CT scan.  Office will call to help coordinate the ultrasound and the CT scan. Please follow-up no improvement, if severe chest pain or shortness of breath, please go to emergency room.

## 2022-12-18 NOTE — Progress Notes (Signed)
Assessment/Plan:   Problem List Items Addressed This Visit       Cardiovascular and Mediastinum   Atrial flutter   Carotid artery disease   Relevant Orders   Lipid Profile     Musculoskeletal and Integument   Rash    Differential diagnosis includes contact dermatitis and allergic reaction, fungal infection Topical treatment with triamcinolone  Prescribed hydroxyzine tablet for the itch if significantly bothersome, with caution to avoid operating machinery if drowsy.  Follow-up on effectiveness of treatment and for further evaluation if no improvement.      Relevant Medications   triamcinolone cream (KENALOG) 0.1 %   hydrOXYzine (ATARAX) 25 MG tablet     Other   Dyspnea on exertion    Likely related to poorly controlled atrial fibrillation. Continue management of atrial fibrillation with current medications and closely monitor symptoms. Screen for heart failure, electrolyte abnormalities, anemia Return and ED precautions discussed.      Relevant Orders   US Venous Img Lower Bilateral   B Nat Peptide   Comp Met (CMET)   TSH   CBC w/Diff   Bilateral edema of lower extremity    Schedule ultrasound imaging for the lower legs to rule out DVT and assess for other blood flow issues. Continue monitoring and manage any contributing factors such as heart conditions or venous insufficiency.      Relevant Orders   US Venous Img Lower Bilateral   Comp Met (CMET)   TSH   CBC w/Diff   Vitamin D deficiency   Relevant Orders   B Nat Peptide   Vitamin D 1,25 dihydroxy   Other Visit Diagnoses     Encounter for screening for malignant neoplasm of lung in former smoker who quit in past 15 years with 30 pack year history or greater    -  Primary   Relevant Orders   CT CHEST LUNG CA SCREEN LOW DOSE W/O CM       There are no discontinued medications.  Return in about 6 months (around 06/19/2023) for BP, fasting labs, HLD.    Subjective:   Encounter date:  12/18/2022  Richard Vazquez is a 67 y.o. male who has Atrial flutter; Hyperlipemia; Hematuria; Carotid artery disease; Persistent atrial fibrillation; CAD (coronary artery disease); Shift work sleep disorder; OSA (obstructive sleep apnea); Class 2 obesity due to excess calories without serious comorbidity with body mass index (BMI) of 35.0 to 35.9 in adult; Renal stones; Prediabetes; Chronic constipation; Rash; Dyspnea on exertion; Bilateral edema of lower extremity; and Vitamin D deficiency on their problem list..   He  has a past medical history of Acute pain of right knee (05/16/2022), Carotid artery disease, Former smoker (12/21/2015), Hyperlipemia, Kidney stones, Left carotid bruit (08/05/2021), Obesity, and Paroxysmal atrial flutter (05/29/2021)..   CHIEF COMPLAINT: Medical management of chronic issues and new skin rash with itching and burning around the neck and collarbone.  HISTORY OF PRESENT ILLNESS:  Rash: The patient reports a rash that started approximately two weeks ago, with sudden onset. It has been getting progressively worse, initially presenting on the left side and now spreading bilaterally. The rash is itchy and burns, interrupting his sleep. The rash is located around the neck, face, and collarbone area. The discomfort from the rash sometimes improves with lotion application but returns after a short while. The patient is concerned about potential relation to medications or if it could be shingles, but reassurance was provided that it was not likely shingles given that it did  not have a unilateral distribution typical for shingles.  Respiratory: The patient experiences shortness of breath, particularly when walking distances at work.  Cardiovascular: History of atrial fibrillation, with some palpitations noticed.  Patient notices that shortness of breath typically occurs when heart rate is elevated especially after exertion.  Heart rate is typically in the 110s monitored by smart  watch.  Edema: Patient reports bilateral leg swelling, more significant on the left leg, that has been present for a couple of years. The swelling has been progressively worsening and is now problematic when putting on shoes.  Medications: Patient has been taking Vitamin D supplementation post-hospitalization due to a deficiency.  Review of Systems  Respiratory:  Positive for shortness of breath.   Cardiovascular:  Positive for palpitations and leg swelling (Bilateral,  worse on left). Negative for chest pain.  Skin:  Positive for itching and rash.    Past Surgical History:  Procedure Laterality Date   A-FLUTTER ABLATION N/A 01/16/2022   Procedure: A-FLUTTER ABLATION;  Surgeon: Duke Salvia, MD;  Location: [facility] INVASIVE CV LAB;  Service: Cardiovascular;  Laterality: N/A;   APPENDECTOMY     CARDIOVERSION N/A 05/31/2021   Procedure: CARDIOVERSION;  Surgeon: Chrystie Nose, MD;  Location: [facility] ENDOSCOPY;  Service: Cardiovascular;  Laterality: N/A;   CARDIOVERSION N/A 10/05/2021   Procedure: CARDIOVERSION;  Surgeon: Chrystie Nose, MD;  Location: [facility] ENDOSCOPY;  Service: Cardiovascular;  Laterality: N/A;   LEFT HEART CATH AND CORONARY ANGIOGRAPHY N/A 12/07/2021   Procedure: LEFT HEART CATH AND CORONARY ANGIOGRAPHY;  Surgeon: [name], MD;  Location: [facility] INVASIVE CV LAB;  Service: Cardiovascular;  Laterality: N/A;   TEE WITHOUT CARDIOVERSION N/A 05/31/2021   Procedure: TRANSESOPHAGEAL ECHOCARDIOGRAM (TEE);  Surgeon: Chrystie Nose, MD;  Location: [facility] ENDOSCOPY;  Service: Cardiovascular;  Laterality: N/A;    Outpatient Medications Prior to Visit  Medication Sig Dispense Refill   apixaban (ELIQUIS) 5 MG TABS tablet Take 1 tablet (5 mg total) by mouth 2 (two) times daily. 180 tablet 3   b complex vitamins capsule Take 1 capsule by mouth daily.     bisacodyl (DULCOLAX) 5 MG EC tablet Take by mouth.     cholecalciferol (VITAMIN D3) 25 MCG (1000 UNIT) tablet      diclofenac Sodium  (VOLTAREN) 1 % GEL Apply 4 g topically 4 (four) times daily as needed. 100 g 3   diltiazem (CARDIZEM CD) 180 MG 24 hr capsule TAKE ONE CAPSULE BY MOUTH DAILY 90 capsule 2   flecainide (TAMBOCOR) 100 MG tablet Take 1 tablet (100 mg total) by mouth 2 (two) times daily. 180 tablet 3   indapamide (LOZOL) 2.5 MG tablet Take 2.5 mg by mouth daily.     Iron, Ferrous Sulfate, 325 (65 Fe) MG TABS Take 325 mg by mouth every other day.     Multiple Vitamin (MULTIVITAMIN) tablet Take 1 tablet by mouth daily.     nitroGLYCERIN (NITROSTAT) 0.4 MG SL tablet Place 1 tablet (0.4 mg total) under the tongue every 5 (five) minutes as needed for chest pain (Max 3 doses). 25 tablet 2   Omega-3 1000 MG CAPS Take 1,000 mg by mouth daily.     polyethylene glycol (MIRALAX / GLYCOLAX) 17 g packet Take 17 g by mouth daily.     tamsulosin (FLOMAX) 0.4 MG CAPS capsule Take 1 capsule by mouth daily.     rosuvastatin (CRESTOR) 20 MG tablet Take 1 tablet (20 mg total) by mouth daily. 90 tablet 3  No facility-administered medications prior to visit.    No family history on file.  Social History   Socioeconomic History   Marital status: Married    Spouse name: Not on file   Number of children: Not on file   Years of education: Not on file   Highest education level: 12th grade  Occupational History   Not on file  Tobacco Use   Smoking status: Former    Packs/day: 1.50    Years: 40.00    Additional pack years: 0.00    Total pack years: 60.00    Types: Cigarettes    Quit date: 12/09/2019    Years since quitting: 3.0    Passive exposure: Past   Smokeless tobacco: Never  Vaping Use   Vaping Use: Never used  Substance and Sexual Activity   Alcohol use: Not Currently   Drug use: Never   Sexual activity: Not Currently  Other Topics Concern   Not on file  Social History Narrative   Not on file   Social Determinants of Health   Financial Resource Strain: Low Risk  (12/14/2022)   Overall Financial Resource  Strain (CARDIA)    Difficulty of Paying Living Expenses: Not hard at all  Food Insecurity: No Food Insecurity (12/14/2022)   Hunger Vital Sign    Worried About Running Out of Food in the Last Year: Never true    Ran Out of Food in the Last Year: Never true  Transportation Needs: No Transportation Needs (12/14/2022)   PRAPARE - Administrator, Civil Service (Medical): No    Lack of Transportation (Non-Medical): No  Physical Activity: Inactive (12/14/2022)   Exercise Vital Sign    Days of Exercise per Week: 0 days    Minutes of Exercise per Session: 0 min  Stress: No Stress Concern Present (12/14/2022)   [employer] of Occupational Health - Occupational Stress Questionnaire    Feeling of Stress : Only a little  Social Connections: Moderately Isolated (12/14/2022)   Social Connection and Isolation Panel [NHANES]    Frequency of Communication with Friends and Family: More than three times a week    Frequency of Social Gatherings with Friends and Family: Three times a week    Attends Religious Services: Never    Active Member of Clubs or Organizations: No    Attends Banker Meetings: Never    Marital Status: Married  Catering manager Violence: Not At Risk (04/11/2022)   Humiliation, Afraid, Rape, and Kick questionnaire    Fear of Current or Ex-Partner: No    Emotionally Abused: No    Physically Abused: No    Sexually Abused: No                                                                                                  Objective:  Physical Exam: BP 138/86 (BP Location: Left Arm, Patient Position: Sitting, Cuff Size: Large)   Pulse 74   Temp 97.6 F (36.4 C) (Temporal)   Wt 274 lb 12.8 oz (124.6 kg)   SpO2 98%   BMI 34.35 kg/m  Physical Exam Constitutional:      Appearance: Normal appearance.  HENT:     Head: Normocephalic and atraumatic.     Right Ear: Hearing normal.     Left Ear: Hearing normal.     Nose: Nose normal.  Eyes:      General: No scleral icterus.       Right eye: No discharge.        Left eye: No discharge.     Extraocular Movements: Extraocular movements intact.  Cardiovascular:     Rate and Rhythm: Normal rate. Rhythm irregular.     Heart sounds: Normal heart sounds.  Pulmonary:     Effort: Pulmonary effort is normal.     Breath sounds: Normal breath sounds.  Abdominal:     Palpations: Abdomen is soft.     Tenderness: There is no abdominal tenderness.  Musculoskeletal:     Right lower leg: 2+ Edema present.     Left lower leg: 3+ Pitting Edema present.  Skin:    General: Skin is warm.     Findings: Erythema and rash present. Rash is papular (Circumferential neck and upper chest and lower face).  Neurological:     General: No focal deficit present.     Mental Status: He is alert.     Cranial Nerves: No cranial nerve deficit.  Psychiatric:        Mood and Affect: Mood normal.        Behavior: Behavior normal.        Thought Content: Thought content normal.        Judgment: Judgment normal.     No results found.  Recent Results (from the past 2160 hour(s))  CBC     Status: None   Collection Time: 09/25/22  2:33 PM  Result Value Ref Range   WBC 9.7 3.4 - 10.8 x10E3/uL   RBC 4.81 4.14 - 5.80 x10E6/uL   Hemoglobin 13.3 13.0 - 17.7 g/dL   Hematocrit 16.1 09.6 - 51.0 %   MCV 85 79 - 97 fL   MCH 27.7 26.6 - 33.0 pg   MCHC 32.7 31.5 - 35.7 g/dL   RDW 04.5 40.9 - 81.1 %   Platelets 275 150 - 450 x10E3/uL  Basic Metabolic Panel (BMET)     Status: Abnormal   Collection Time: 09/25/22  2:33 PM  Result Value Ref Range   Glucose 136 (H) 70 - 99 mg/dL   BUN 20 8 - 27 mg/dL   Creatinine, Ser 9.14 0.76 - 1.27 mg/dL   eGFR 78 >78 GN/FAO/1.30   BUN/Creatinine Ratio 19 10 - 24   Sodium 141 134 - 144 mmol/L   Potassium 4.2 3.5 - 5.2 mmol/L   Chloride 101 96 - 106 mmol/L   CO2 25 20 - 29 mmol/L   Calcium 9.6 8.6 - 10.2 mg/dL  Pro b natriuretic peptide (BNP)     Status: None   Collection Time:  09/25/22  2:33 PM  Result Value Ref Range   NT-Pro BNP 58 0 - 376 pg/mL    Comment: The following cut-points have been suggested for the use of proBNP for the diagnostic evaluation of heart failure (HF) in patients with acute dyspnea: Modality                     Age           Optimal Cut                            (  years)            Point ------------------------------------------------------ Diagnosis (rule in HF)        <50            450 pg/mL                           50 - 75            900 pg/mL                               >75           1800 pg/mL Exclusion (rule out HF)  Age independent     300 pg/mL   HgB A1c     Status: Abnormal   Collection Time: 09/25/22  2:33 PM  Result Value Ref Range   Hgb A1c MFr Bld 6.3 (H) 4.8 - 5.6 %    Comment:          Prediabetes: 5.7 - 6.4          Diabetes: >6.4          Glycemic control for adults with diabetes: <7.0    Est. average glucose Bld gHb Est-mCnc 134 mg/dL  VITAMIN D 25 Hydroxy (Vit-D Deficiency, Fractures)     Status: None   Collection Time: 09/25/22  2:33 PM  Result Value Ref Range   Vit D, 25-Hydroxy 60.8 30.0 - 100.0 ng/mL    Comment: Vitamin D deficiency has been defined by the Institute of Medicine and an Endocrine Society practice guideline as a level of serum 25-OH vitamin D less than 20 ng/mL (1,2). The Endocrine Society went on to further define vitamin D insufficiency as a level between 21 and 29 ng/mL (2). 1. IOM (Institute of Medicine). 2010. Dietary reference    intakes for calcium and D. [place]: The    [employer]. 2. Holick MF, Binkley Harrison, Bischoff-Ferrari HA, et al.    Evaluation, treatment, and prevention of vitamin D    deficiency: an Endocrine Society clinical practice    guideline. JCEM. 2011 Jul; 96(7):1911-30.         Garner Nash, MD, MS

## 2022-12-18 NOTE — Assessment & Plan Note (Signed)
Differential diagnosis includes contact dermatitis and allergic reaction, fungal infection Topical treatment with triamcinolone  Prescribed hydroxyzine tablet for the itch if significantly bothersome, with caution to avoid operating machinery if drowsy.  Follow-up on effectiveness of treatment and for further evaluation if no improvement.

## 2022-12-22 ENCOUNTER — Ambulatory Visit (HOSPITAL_BASED_OUTPATIENT_CLINIC_OR_DEPARTMENT_OTHER)
Admission: RE | Admit: 2022-12-22 | Discharge: 2022-12-22 | Disposition: A | Discharge status: Home / Self Care | Payer: Medicare Other | Source: Ambulatory Visit | Attending: Family Medicine | Admitting: Family Medicine

## 2022-12-22 DIAGNOSIS — R600 Localized edema: Secondary | ICD-10-CM | POA: Diagnosis not present

## 2022-12-22 DIAGNOSIS — R0609 Other forms of dyspnea: Secondary | ICD-10-CM

## 2022-12-22 DIAGNOSIS — M7989 Other specified soft tissue disorders: Secondary | ICD-10-CM | POA: Diagnosis not present

## 2022-12-22 NOTE — Addendum Note (Signed)
Addended by: Garnette Gunner on: 12/22/2022 01:39 PM   Modules accepted: Orders

## 2023-01-09 ENCOUNTER — Telehealth: Payer: Self-pay | Admitting: Family Medicine

## 2023-01-09 DIAGNOSIS — K921 Melena: Secondary | ICD-10-CM

## 2023-01-09 NOTE — Telephone Encounter (Signed)
Pt would like  a referral for a colonoscopy. He would like an appt with a [place] office. Sometimes he sees blood when he wipes.

## 2023-01-10 ENCOUNTER — Encounter: Payer: Self-pay | Admitting: Family Medicine

## 2023-01-17 NOTE — Addendum Note (Signed)
Addended by: Garnette Gunner on: 01/17/2023 06:38 PM   Modules accepted: Orders

## 2023-01-18 ENCOUNTER — Ambulatory Visit
Admission: RE | Admit: 2023-01-18 | Discharge: 2023-01-18 | Disposition: A | Discharge status: Home / Self Care | Payer: Medicare Other | Source: Ambulatory Visit | Attending: Family Medicine | Admitting: Family Medicine

## 2023-01-18 DIAGNOSIS — Z87891 Personal history of nicotine dependence: Secondary | ICD-10-CM | POA: Diagnosis not present

## 2023-01-24 ENCOUNTER — Other Ambulatory Visit: Payer: Self-pay | Admitting: Nurse Practitioner

## 2023-01-30 ENCOUNTER — Telehealth: Payer: Self-pay | Admitting: Internal Medicine

## 2023-01-30 NOTE — Telephone Encounter (Signed)
  Patient c/o Palpitations:  High priority if patient c/o lightheadedness, shortness of breath, or chest pain  How long have you had palpitations/irregular HR/ Afib? Are you having the symptoms now? Yes   Are you currently experiencing lightheadedness, SOB or CP? A little lightheadedness and SOB   Do you have a history of afib (atrial fibrillation) or irregular heart rhythm? Yes   Have you checked your BP or HR? (document readings if available): last Saturday 183 one of the nights it went below 45  Are you experiencing any other symptoms?  Pt said, for the past couple of days, he is experiencing irregular HR, he thinks he is in afib again. He said, last Saturday his smart watch notified him his HR at 183. He gets SOB when walking for couple of steps and sometimes he feels light headedness. He wants to know if he need to see Dr. Tenny Craw to be check

## 2023-01-30 NOTE — Telephone Encounter (Signed)
Patient reports that since this weekend he has been going in and out of afib. He reports that his heart rate has been fluctuating. This morning it was in the 60s but has gone up to 120-140 at times. He states that his morning he had a headache but otherwise he has been feeling fine. He states he has a smart watch to monitor his heart rate and rhythm. He states that currently he is in normal sinus rhythm. He has gotten several readings that have read as A fib over the past several days. He had an A flutter ablation with Dr. Graciela Husbands about a year ago. He is going to continue to monitor things and I will see if we can get him a follow up appointment with EP sometime soon.

## 2023-02-12 ENCOUNTER — Telehealth: Payer: Self-pay | Admitting: Internal Medicine

## 2023-02-12 NOTE — Telephone Encounter (Signed)
Returned call to patient. Patient states he usually takes his Eliquis and Flecainide at 3:15am and 3:15pm. He states he took his missed dose at 7:00am this morning and would like to know if it would be OK for him to take his second dose at 3:15pm as he normally does.  Informed patient it has been about 8 hours between doses and should be fine to take afternoon dose today and get back on his 12-hour dosing.  Patient verbalized understanding and expressed appreciation for call.

## 2023-02-12 NOTE — Telephone Encounter (Signed)
Pt c/o medication issue:  1. Name of Medication:  Flecainide  Eliquis  2. How are you currently taking this medication (dosage and times per day)?   3. Are you having a reaction (difficulty breathing--STAT)?   4. What is your medication issue?   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 He forgot his medications yesterday so he missed a dose and took it around 7:00 AM. He would like to know if it will be alright to take his other medications like he normally does at 3:15 PM.

## 2023-02-15 NOTE — Progress Notes (Addendum)
Cardiology Office Note Date:  02/15/2023  Patient ID:  Richard, Vazquez Oct 08, 1955, MRN [id number] PCP:  Garnette Gunner, MD  Cardiologist:  Dr Tenny Craw Electrophysiologist: Dr. Graciela Husbands    Chief Complaint:  AFib/annual visit  History of Present Illness: Richard Vazquez is a 67 y.o. 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 It was elected to ablate his cavotricuspid isthmus; unfortunately, within a couple of weeks he had recurrence of a flutter. Flecainide was initiated   He saw Dr. Graciela Husbands 02/22/22, maintaining then SR, stable intervals  He saw Dr. Tenny Craw 09/25/22, s/p lithotripsy complicated by hematoma requiring interruption in [facility], doing well otherwise, no changes were made, planned for labs  TODAY All in all doing well He works still, second shift at a warehouse, on his feet walking quite a bit with good exertional capacity, will get a lttle winded with longer distances, no difficulties with usual rounds, or ADLs No CP No near syncope or syncope. He for years has had some intermittent hematuria and hematochezia, unchanged, known renal stones Due for his colonoscopy (has been >10 years), not scheduled.  June 1st he was alerted by his watch (before he got out of bed) to a high HR, he felt his heart beat fast, though didn't feel unwell. Went about his day, felt a little "off", but OK. Ran some errands, HR was "all over the pace", 80's-130s once home went upstairs and back down, started to feel a little weak and laid down, HR then to 180s, settled and eventually back to normal rhythm. He has observed HRs in the last several months anywhere from the 40's -110's only of late with the very fast rate  Reports excellent  medication compliance  He has also had low HR alerts to the 40's as well, one I saw on his phone today about midnight though that is in the middle of hisshoft at work. When it is slow he does feel a little fatigued    AF/AAD hx AFlutter May 2023 Ablation 01/16/22 May 2023 Flecainide post ABlation with atypical AFlutter    Past Medical History:  Diagnosis Date   Acute pain of right knee 05/16/2022   Carotid artery disease (HCC)    Carotid US 09/01/2022: R 40-59, L 1-39   Former smoker 12/21/2015   Hyperlipemia    Kidney stones    Left carotid bruit 08/05/2021   Obesity    Paroxysmal atrial flutter (HCC) 05/29/2021    Past Surgical History:  Procedure Laterality Date   A-FLUTTER ABLATION N/A 01/16/2022   Procedure: A-FLUTTER ABLATION;  Surgeon: Duke Salvia, MD;  Location: [facility] INVASIVE CV LAB;  Service: Cardiovascular;  Laterality: N/A;   APPENDECTOMY     CARDIOVERSION N/A 05/31/2021   Procedure: CARDIOVERSION;  Surgeon: Chrystie Nose, MD;  Location: [facility] ENDOSCOPY;  Service: Cardiovascular;  Laterality: N/A;   CARDIOVERSION N/A 10/05/2021   Procedure: CARDIOVERSION;  Surgeon: Chrystie Nose, MD;  Location: [facility] ENDOSCOPY;  Service: Cardiovascular;  Laterality: N/A;   LEFT HEART CATH AND CORONARY ANGIOGRAPHY N/A 12/07/2021   Procedure: LEFT HEART CATH AND CORONARY ANGIOGRAPHY;  Surgeon: [name], MD;  Location: [facility] INVASIVE CV LAB;  Service: Cardiovascular;  Laterality: N/A;   TEE WITHOUT CARDIOVERSION N/A 05/31/2021   Procedure: TRANSESOPHAGEAL ECHOCARDIOGRAM (TEE);  Surgeon: Chrystie Nose, MD;  Location[facility] ENDOSCOPY;  Service: Cardiovascular;  Laterality: N/A;    Current Outpatient Medications  Medication Sig Dispense Refill   apixaban (ELIQUIS) 5 MG TABS tablet Take 1 tablet (5 mg total) by mouth 2 (two) times daily. 180 tablet 3   b complex vitamins capsule Take 1 capsule by mouth daily.     bisacodyl (DULCOLAX) 5 MG EC tablet Take by mouth.     cholecalciferol  (VITAMIN D3) 25 MCG (1000 UNIT) tablet      diclofenac Sodium (VOLTAREN) 1 % GEL Apply 4 g topically 4 (four) times daily as needed. 100 g 3   diltiazem (CARDIZEM CD) 180 MG 24 hr capsule TAKE ONE CAPSULE BY MOUTH DAILY 90 capsule 2   flecainide (TAMBOCOR) 100 MG tablet TAKE 1 TABLET BY MOUTH TWICE A DAY 180 tablet 3   hydrOXYzine (ATARAX) 25 MG tablet Take 0.5-1 tablets (12.5-25 mg total) by mouth every 8 (eight) hours as needed for itching. 30 tablet 0   indapamide (LOZOL) 2.5 MG tablet Take 2.5 mg by mouth daily.     Iron, Ferrous Sulfate, 325 (65 Fe) MG TABS Take 325 mg by mouth every other day.     Multiple Vitamin (MULTIVITAMIN) tablet Take 1 tablet by mouth daily.     nitroGLYCERIN (NITROSTAT) 0.4 MG SL tablet Place 1 tablet (0.4 mg total) under the tongue every 5 (five) minutes as needed for chest pain (Max 3 doses). 25 tablet 2   Omega-3 1000 MG CAPS Take 1,000 mg by mouth daily.     polyethylene glycol (MIRALAX / GLYCOLAX) 17 g packet Take 17 g by mouth daily.     rosuvastatin (CRESTOR) 20 MG tablet Take 1 tablet (20 mg total) by mouth daily. 90 tablet 3   tamsulosin (FLOMAX) 0.4 MG CAPS capsule Take 1 capsule by mouth daily.     triamcinolone cream (KENALOG) 0.1 % Apply 1 Application topically 2 (two) times daily. 30 g 0   No current facility-administered medications for this visit.    Allergies:   Lipitor [atorvastatin], Codeine, and Latex   Social History:  The patient  reports that he quit smoking about 3 years ago. His smoking use included cigarettes. He has a 60.00 pack-year smoking history. He has been exposed to tobacco smoke. He has never used smokeless tobacco. He reports that he does not currently use alcohol. He reports that he does not use drugs.   Family History:  The patient's family history is not on file.  ROS:  Please see the history of present illness.    All other systems are reviewed and otherwise negative.   PHYSICAL EXAM:  VS:  There were no vitals taken  for this visit. BMI: There is no height or weight on file to calculate BMI. Well nourished, well developed, in no acute distress HEENT: normocephalic, atraumatic Neck: no JVD, carotid bruits or masses Cardiac:   RRR; no significant murmurs, no rubs, or gallops Lungs:  CTA b/l, no wheezing, rhonchi or rales Abd: soft, nontender MS: no deformity or atrophy Ext: trace edema b/l Skin: warm and dry, no rash Neuro:  No gross deficits appreciated Psych: euthymic mood, full affect   EKG:  Done today and reviewed by myself shows  SR 60bpm, 1st degree AVblock, (224 > 228 > ) , RBBB (new, QRS durations 96 > 110 > 150 today)   03/02/22: ETT The ECG was negative for ischemia. No ST deviation was noted.   Exercise  capacity was mildly impaired. Patient exercised for 5 min and 0 sec. Maximum HR of 125 bpm. MPHR 81.0 %. Peak METS 7.0 .   Hypertensive response to exercise.    01/16/22: EPS/ablation Impression: #1-normal sinus node function #2 abnormal atrial function manifested by sustained atrial flutter. Catheter ablation successfully eliminated the substrate for isthmus dependent flutter #3-normal AV nodal function #4-normal His-Purkinje system function #5-no evidence of accessory pathway #6-normal ventricular function as described above.  Pt had a variety of post catheter arrhythmia including different flutters. It was elected to porceed as his clinical rhythm had been typical atrial flutter and this had prompted repeated hospital therapies    12/06/21: TTE 1. Left ventricular ejection fraction, by estimation, is 65 to 70%. The  left ventricle has hyperdynamic function. The left ventricle has no  regional wall motion abnormalities. There is mild concentric left  ventricular hypertrophy. Left ventricular  diastolic function could not be evaluated.   2. Right ventricular systolic function was not well visualized. The right  ventricular size is normal. There is normal pulmonary artery  systolic  pressure.   3. The mitral valve is normal in structure. Mild mitral valve  regurgitation. No evidence of mitral stenosis.   4. The aortic valve is normal in structure. Aortic valve regurgitation is  not visualized. No aortic stenosis is present.   5. The inferior vena cava is dilated in size with >50% respiratory  variability, suggesting right atrial pressure of 8 mmHg.    Recent Labs: 09/25/2022: BUN 20; Creatinine, Ser 1.05; Hemoglobin 13.3; NT-Pro BNP 58; Platelets 275; Potassium 4.2; Sodium 141  No results found for requested labs within last 365 days.   CrCl cannot be calculated (Patient's most recent lab result is older than the maximum 21 days allowed.).   Wt Readings from Last 3 Encounters:  12/18/22 274 lb 12.8 oz (124.6 kg)  09/25/22 281 lb 9.6 oz (127.7 kg)  06/15/22 273 lb 9.6 oz (124.1 kg)     Other studies reviewed: Additional studies/records reviewed today include: summarized above  ASSESSMENT AND PLAN:  AFlutter (atypical) CTI ablation 2023 CHA2DS2Vasc is one, on Eliquis, appropriately dosed intermittent burden by symptoms, more so perhaps of late  New RBBB with lengthening of both PR and QRS intervals Dose reduction not likely helpful given he has been having some palpitations, AF (?flutter vs fib by symptoms and phone). Also has awake brady rates 40's    Media Information      Media Information    STOP flecainide EKG on Mon or Tues of next week 2 week AT monitor, AFlutter vs AFib Need to consider alternative AAD, perhaps Tikosyn, perhaps consideration of ablation   2. Secondary hypercoagulable state   Disposition: F/u with [place] in 3-4 weeks, discussed recurrent/sustained fast rates, symptoms to seek attention in the meantime.    Current medicines are reviewed at length with the patient today.  The patient did not have any concerns regarding medicines.  Richard Fredrickson, PA-C 02/15/2023 7:21 AM     [facility] [address] [place] [address] [phone] (office)  [phone] (fax)

## 2023-02-16 ENCOUNTER — Encounter: Payer: Self-pay | Admitting: Physician Assistant

## 2023-02-16 ENCOUNTER — Other Ambulatory Visit (INDEPENDENT_AMBULATORY_CARE_PROVIDER_SITE_OTHER): Payer: Medicare Other

## 2023-02-16 ENCOUNTER — Ambulatory Visit: Payer: Medicare Other | Attending: Physician Assistant | Admitting: Physician Assistant

## 2023-02-16 VITALS — BP 122/68 | HR 62 | Ht 75.0 in | Wt 269.6 lb

## 2023-02-16 DIAGNOSIS — T462X5A Adverse effect of other antidysrhythmic drugs, initial encounter: Secondary | ICD-10-CM | POA: Diagnosis not present

## 2023-02-16 DIAGNOSIS — I4819 Other persistent atrial fibrillation: Secondary | ICD-10-CM | POA: Diagnosis not present

## 2023-02-16 DIAGNOSIS — I495 Sick sinus syndrome: Secondary | ICD-10-CM

## 2023-02-16 LAB — BASIC METABOLIC PANEL
BUN/Creatinine Ratio: 21 (ref 10–24)
CO2: 28 mmol/L (ref 20–29)

## 2023-02-16 LAB — CBC

## 2023-02-16 NOTE — Progress Notes (Unsigned)
Enrolled for [employer] to mail a ZIO AT Live Telemetry monitor to patients address on file.   Dr. Tenny Craw to read.

## 2023-02-16 NOTE — Patient Instructions (Addendum)
Medication Instructions:   STOP TAKING AND REMOVE THIS MEDICATION FROM YOUR MEDICATION LIST: FLECAINIDE    *If you need a refill on your cardiac medications before your next appointment, please call your pharmacy*   Lab Work:  BMET AND CBC TODAY    If you have labs (blood work) drawn today and your tests are completely normal, you will receive your results only by: MyChart Message (if you have MyChart) OR A paper copy in the mail If you have any lab test that is abnormal or we need to change your treatment, we will call you to review the results.   Testing/Procedures: Your physician has recommended that you wear an event monitor. Event monitors are medical devices that record the heart's electrical activity. Doctors most often [place] these monitors to diagnose arrhythmias. Arrhythmias are problems with the speed or rhythm of the heartbeat. The monitor is a small, portable device. You can wear one while you do your normal daily activities. This is usually used to diagnose what is causing palpitations/syncope (passing out).    Follow-Up: At [facility], you and your health needs are our priority.  As part of our continuing mission to provide you with exceptional heart care, we have created designated Provider Care Teams.  These Care Teams include your primary Cardiologist (physician) and Advanced Practice Providers (APPs -  Physician Assistants and Nurse Practitioners) who all work together to provide you with the care you need, when you need it.  We recommend signing up for the patient portal called "MyChart".  Sign up information is provided on this After Visit Summary.  MyChart is used to connect with patients for Virtual Visits (Telemedicine).  Patients are able to view lab/test results, encounter notes, upcoming appointments, etc.  Non-urgent messages can be sent to your provider as well.   To learn more about what you can do with MyChart, go to [email or web address].    Your  next appointment:   NURSE VISIT  NEXT WEEK WITH EKG   3-4  week(s)  Provider:   You may see Dr. Graciela Husbands  or one of the following Advanced Practice Providers on your designated Care Team:   Francis Dowse, [place] Casimiro Needle "Mardelle Matte" Lanna Poche, [place]   Other Instructions  ZIO AT Long term monitor-Live Telemetry  Your physician has requested you wear a ZIO patch monitor for 14 days.  This is a single patch monitor. [employer] supplies one patch monitor per enrollment. Additional  stickers are not available.  Please do not apply patch if you will be having a Nuclear Stress Test, Echocardiogram, Cardiac CT, MRI,  or Chest Xray during the period you would be wearing the monitor. The patch cannot be worn during  these tests. You cannot remove and re-apply the ZIO AT patch monitor.  Your ZIO patch monitor will be mailed 3 day USPS to your address on file. It may take 3-5 days to  receive your monitor after you have been enrolled.  Once you have received your monitor, please review the enclosed instructions. Your monitor has  already been registered assigning a specific monitor serial # to you.   Billing and Patient Assistance Program information  Meredeth Ide has been supplied with any insurance information on record for billing. [employer] offers a sliding scale Patient Assistance Program for patients without insurance, or whose  insurance does not completely cover the cost of the ZIO patch monitor. You must apply for the  Patient Assistance Program to qualify for the discounted rate. To apply, call [employer]  at [phone],  select option 4, select option 2 , ask to apply for the Patient Assistance Program, (you can request an  interpreter if needed). [employer] will ask your household income and how many people are in your  household. [employer] will quote your out-of-pocket cost based on this information. They will also be able  to set up a 12 month interest free payment plan if needed.  Applying the monitor   Shave  hair from upper left chest.  Hold the abrader disc by orange tab. Rub the abrader in 40 strokes over left upper chest as indicated in  your monitor instructions.  Clean area with 4 enclosed alcohol pads. Use all pads to ensure the area is cleaned thoroughly. Let  dry.  Apply patch as indicated in monitor instructions. Patch will be placed under collarbone on left side of  chest with arrow pointing upward.  Rub patch adhesive wings for 2 minutes. Remove the white label marked "1". Remove the white label  marked "2". Rub patch adhesive wings for 2 additional minutes.  While looking in a mirror, press and release button in center of patch. A small green light will flash 3-4  times. This will be your only indicator that the monitor has been turned on.  Do not shower for the first 24 hours. You may shower after the first 24 hours.  Press the button if you feel a symptom. You will hear a small click. Record Date, Time and Symptom in  the Patient Log.   Starting the Gateway  In your kit there is a Audiological scientist box the size of a cellphone. This is Buyer, retail. It transmits all your  recorded data to [facility]. This box must always stay within 10 feet of you. Open the box and push the *  button. There will be a light that blinks orange and then green a few times. When the light stops  blinking, the Gateway is connected to the ZIO patch. Call [employer] at [phone] to confirm your monitor is transmitting.  Returning your monitor  Remove your patch and place it inside the Gateway. In the lower half of the Gateway there is a white  bag with prepaid postage on it. Place Gateway in bag and seal. Mail package back to [place] as soon as  possible. Your physician should have your final report approximately 7 days after you have mailed back  your monitor. Call [facility] Customer Care at [phone] if you have questions regarding your ZIO AT  patch monitor. Call them immediately if you see  an orange light blinking on your monitor.  If your monitor falls off in less than 4 days, contact our Monitor department at [phone]. If your  monitor becomes loose or falls off after 4 days call [employer] at [phone] for suggestions on  securing your monitor

## 2023-02-17 LAB — BASIC METABOLIC PANEL
BUN: 24 mg/dL (ref 8–27)
Calcium: 9.5 mg/dL (ref 8.6–10.2)
Chloride: 103 mmol/L (ref 96–106)
Creatinine, Ser: 1.14 mg/dL (ref 0.76–1.27)
Glucose: 143 mg/dL — ABNORMAL HIGH (ref 70–99)
Potassium: 3.8 mmol/L (ref 3.5–5.2)
Sodium: 144 mmol/L (ref 134–144)
eGFR: 70 mL/min/{1.73_m2} (ref >59)

## 2023-02-17 LAB — CBC
MCH: 29.3 pg (ref 26.6–33.0)
MCHC: 33.3 g/dL (ref 31.5–35.7)
RBC: 4.68 x10E6/uL (ref 4.14–5.80)
RDW: 14.5 % (ref 11.6–15.4)

## 2023-02-20 ENCOUNTER — Telehealth: Payer: Self-pay | Admitting: Internal Medicine

## 2023-02-20 DIAGNOSIS — I4892 Unspecified atrial flutter: Secondary | ICD-10-CM

## 2023-02-20 DIAGNOSIS — I495 Sick sinus syndrome: Secondary | ICD-10-CM

## 2023-02-20 NOTE — Telephone Encounter (Signed)
Patient calling back to report that his apple watch did show that he was in Afib with heart rate of 63. He states that his blood pressure was 139/72. He was not dizzy at this time. Advised that message has been sent to [facility]. Advised to continue monitoring.

## 2023-02-20 NOTE — Telephone Encounter (Signed)
Called patient back about his message. Patient stated since his flecainide was stopped he has been having dizzy spells here and there, and feels off.  Patient stated he just does not feel good. Patient is due to get EKG later this week and has monitor to start wearing. Patient's stated his HR has been fine, but does not know what his BP is. Encouraged patient to check his BP when he has these dizzy spells. Encouraged patient to stay hydrated. Will forward to Francis Dowse PA for further advisement.

## 2023-02-20 NOTE — Telephone Encounter (Signed)
STAT if patient feels like he/she is going to faint   Are you dizzy now? no  Do you feel faint or have you passed out? no  Do you have any other symptoms? Nausea   Have you checked your HR and BP (record if available)? HR 78  Think its because he has been taking off his medication. Please advise

## 2023-02-20 NOTE — Telephone Encounter (Signed)
Pt called in asking to speak with nurse again.

## 2023-02-21 DIAGNOSIS — I495 Sick sinus syndrome: Secondary | ICD-10-CM | POA: Diagnosis not present

## 2023-02-22 ENCOUNTER — Ambulatory Visit: Payer: Medicare Other | Attending: Cardiology

## 2023-02-22 VITALS — BP 142/70 | HR 64 | Ht 75.0 in | Wt 269.8 lb

## 2023-02-22 DIAGNOSIS — I4819 Other persistent atrial fibrillation: Secondary | ICD-10-CM | POA: Diagnosis not present

## 2023-02-22 NOTE — Telephone Encounter (Signed)
Left detailed message per DPR. Patient has been referred to EP for evaluation of afib.

## 2023-02-22 NOTE — Progress Notes (Signed)
EKG reviewed and shows NSR with RBBB with shorter QRS duration after stopping flecainide

## 2023-02-22 NOTE — Patient Instructions (Signed)
Medication Instructions:  Your physician recommends that you continue on your current medications as directed. Please refer to the Current Medication list given to you today.  *If you need a refill on your cardiac medications before your next appointment, please call your pharmacy*   Follow-Up: At [facility], you and your health needs are our priority.  As part of our continuing mission to provide you with exceptional heart care, we have created designated Provider Care Teams.  These Care Teams include your primary Cardiologist (physician) and Advanced Practice Providers (APPs -  Physician Assistants and Nurse Practitioners) who all work together to provide you with the care you need, when you need it.   Your next appointment:   As Scheduled

## 2023-02-22 NOTE — Progress Notes (Addendum)
   Nurse Visit   Date of Encounter: 02/22/2023 ID: Richard Vazquez, DOB [birth year], MRN [id number]  PCP:  Garnette Gunner, MD   [facility] Providers Cardiologist:  Dietrich Pates, MD Cardiology APP:  Beatrice Lecher, PA-C      Visit Details   VS:  BP (!) 142/70   Pulse 64   Ht 6\' 3"  (1.905 m)   Wt 269 lb 12.8 oz (122.4 kg)   SpO2 96%   BMI 33.72 kg/m  , BMI Body mass index is 33.72 kg/m.  Wt Readings from Last 3 Encounters:  02/22/23 269 lb 12.8 oz (122.4 kg)  02/16/23 269 lb 9.6 oz (122.3 kg)  12/18/22 274 lb 12.8 oz (124.6 kg)     Reason for visit: EKG  Performed today: Vitals, EKG, Provider consulted:Dr Mayford Knife (DOD), and Education Changes (medications, testing, etc.) : none Length of Visit: 25 minutes  EKG Interpretation Date/Time:  Thursday February 22 2023 14:24:01 EDT Ventricular Rate:  56 PR Interval:  216 QRS Duration:  128 QT Interval:  440 QTC Calculation: 424 R Axis:   69  Text Interpretation: Normal sinus rhythm Right bundle branch block When compared with ECG of 16-Feb-2023 09:23, No significant change since last tracing Confirmed by Armanda Magic ([id number]) on 02/22/2023 2:56:06 PM    Medications Adjustments/Labs and Tests Ordered: Orders Placed This Encounter  Procedures   EKG 12-Lead   EKG 12-Lead   No orders of the defined types were placed in this encounter.  Will continue to monitor. Patient is current wearing a 14 Day Zio monitor and has a referral for EP.   Signed, Eilleen Kempf, RN  02/22/2023 3:03 PM

## 2023-02-23 NOTE — Telephone Encounter (Signed)
Sheilah Pigeon, PA-C  You3 minutes ago (4:36 PM)   EKG yesterday looked stable HR, BP sound OK  Please schedule to see the AFib clinic next week ER precautions   Called patient with [facility] advisement. Placed referral for A. FIB clinic. Will send A. FIB clinic this  message.

## 2023-02-23 NOTE — Addendum Note (Signed)
Addended by: Virl Axe, Madden Piazza L on: 02/23/2023 04:59 PM   Modules accepted: Orders

## 2023-02-26 ENCOUNTER — Telehealth: Payer: Self-pay | Admitting: Internal Medicine

## 2023-02-26 DIAGNOSIS — N200 Calculus of kidney: Secondary | ICD-10-CM | POA: Diagnosis not present

## 2023-02-26 NOTE — Telephone Encounter (Signed)
Pt returning nurses phone call. Please advise ?

## 2023-02-26 NOTE — Telephone Encounter (Signed)
Left voicemail message for the pt to call the clinic. 

## 2023-02-26 NOTE — Telephone Encounter (Signed)
Returned call to patient.  He said he is dizzy and short of breath and probably calling out of work tonight for this.  He has a urology appointment today at 4pm.  -  His wife will drive him.     139/71, HR 65. Staying hydrated.   I gave ER precautions for worsening symptoms or syncope.  He voices understanding and agreement.  Seeing afib clinic 03/09/23.

## 2023-02-26 NOTE — Telephone Encounter (Signed)
Called patient back.  See telephone encounter 02/20/23.

## 2023-02-26 NOTE — Telephone Encounter (Signed)
Appt made for next week. Pt in agreement.

## 2023-02-27 ENCOUNTER — Encounter: Payer: Self-pay | Admitting: Physician Assistant

## 2023-03-02 ENCOUNTER — Telehealth: Payer: Self-pay

## 2023-03-02 NOTE — Telephone Encounter (Signed)
Patient returned RN's call. 

## 2023-03-02 NOTE — Telephone Encounter (Signed)
Returned call to patient who states he doesn't feel any need to come in sooner than his A-fib clinic

## 2023-03-02 NOTE — Telephone Encounter (Signed)
-----   Message from Sheilah Pigeon, [place] sent at 03/02/2023 10:18 AM EDT ----- Can you please follow up with him?  Follow up on his symptoms. I left a message for him, following up on his calls/symptoms last week. My note is in  Thanks  [place]

## 2023-03-02 NOTE — Telephone Encounter (Signed)
Called to follow up, left a voice mail following up on symptoms from last week. Advised to call the office, left out phone number for him. Will ask out RN triage team to follow up  Zio data reviewed, SR rates good.  I do not see any significant/prolonged bradycardia, no AFib 7/1 he had very transient rates dipped to 48/49 with sinus arrhythmia We can think about reducing his diltiazem dose if needed.  Francis Dowse, PA-C

## 2023-03-02 NOTE — Telephone Encounter (Signed)
He states he has 4 days left on his live zio and hasn't felt abnormal.

## 2023-03-02 NOTE — Telephone Encounter (Signed)
Called patient at R. Ursuy's (PA-C) request to follow up on symptoms and see if he feels  he needs to be seen by DOD before afib clinic appt on 03/09/23. Patient was sleeping so I spoke with spouse (DPR). Spouse states patient has not been complaining of symptoms recently and acknowledges appt with afib clinic on 03/09/23. Spouse declines DOD appt offered for 03/06/23 until she can talk to patient, states she feels this follow up is adequate but she will check with patient and have him call us back.

## 2023-03-09 ENCOUNTER — Other Ambulatory Visit: Payer: Self-pay

## 2023-03-09 ENCOUNTER — Telehealth: Payer: Self-pay

## 2023-03-09 ENCOUNTER — Ambulatory Visit (HOSPITAL_COMMUNITY)
Admission: RE | Admit: 2023-03-09 | Discharge: 2023-03-09 | Disposition: A | Discharge status: Home / Self Care | Payer: Medicare Other | Source: Ambulatory Visit | Attending: Internal Medicine | Admitting: Internal Medicine

## 2023-03-09 VITALS — BP 142/72 | HR 56 | Ht 75.0 in | Wt 271.4 lb

## 2023-03-09 DIAGNOSIS — D6869 Other thrombophilia: Secondary | ICD-10-CM | POA: Diagnosis not present

## 2023-03-09 DIAGNOSIS — I4510 Unspecified right bundle-branch block: Secondary | ICD-10-CM | POA: Insufficient documentation

## 2023-03-09 DIAGNOSIS — Z7901 Long term (current) use of anticoagulants: Secondary | ICD-10-CM | POA: Insufficient documentation

## 2023-03-09 DIAGNOSIS — G4733 Obstructive sleep apnea (adult) (pediatric): Secondary | ICD-10-CM | POA: Insufficient documentation

## 2023-03-09 DIAGNOSIS — I480 Paroxysmal atrial fibrillation: Secondary | ICD-10-CM | POA: Insufficient documentation

## 2023-03-09 DIAGNOSIS — E785 Hyperlipidemia, unspecified: Secondary | ICD-10-CM | POA: Insufficient documentation

## 2023-03-09 DIAGNOSIS — Z8679 Personal history of other diseases of the circulatory system: Secondary | ICD-10-CM | POA: Diagnosis not present

## 2023-03-09 DIAGNOSIS — E669 Obesity, unspecified: Secondary | ICD-10-CM | POA: Diagnosis not present

## 2023-03-09 DIAGNOSIS — I484 Atypical atrial flutter: Secondary | ICD-10-CM

## 2023-03-09 DIAGNOSIS — Z6833 Body mass index (BMI) 33.0-33.9, adult: Secondary | ICD-10-CM | POA: Insufficient documentation

## 2023-03-09 DIAGNOSIS — Z79899 Other long term (current) drug therapy: Secondary | ICD-10-CM | POA: Diagnosis not present

## 2023-03-09 DIAGNOSIS — I4819 Other persistent atrial fibrillation: Secondary | ICD-10-CM

## 2023-03-09 DIAGNOSIS — I491 Atrial premature depolarization: Secondary | ICD-10-CM | POA: Diagnosis not present

## 2023-03-09 NOTE — Telephone Encounter (Signed)
-----   Message from [facility] Apolinar Junes J sent at 03/05/2023  2:32 PM EDT ----- Regarding: RE: cpap supplies Patient updated his insurance per [facility]) and everything looks good on their end. Thanks ----- Message ----- From: Luellen Pucker, RN Sent: 02/22/2023   3:28 PM EDT To: Quintella Reichert, MD; Reesa Chew, CMA Subject: cpap supplies                                  Hey ladies- This patient was last seen October 2023 and has follow up scheduled October 2024, but he is saying that his insurance is denying everything and he doesn't know why. He called to ask if he needs to be seen sooner in order to get his supplies. Any guidance? Richard Vazquez

## 2023-03-09 NOTE — Telephone Encounter (Signed)
Spoke with patient who states he did in fact receive his cpap supplies, he states it appears there was an issue with blue cross blue shield at one point but it was ultimately resolved. He states he has no needs at this time.

## 2023-03-09 NOTE — Progress Notes (Signed)
Primary Care Physician: Garnette Gunner, MD Primary Cardiologist: Dietrich Pates, MD Electrophysiologist: None     Referring Physician: Francis Dowse, PA-C     Richard Vazquez is a 67 y.o. male with a history of obesity, HLD, OSA on CPAP, atypical atrial flutter, atrial fibrillation, and CAD who presents for consultation in the [facility].  He has a history of atrial flutter ablation 01/16/2022 and had to be started on flecainide after due to recurrence of arrhythmia. Seen by Luster Landsberg on 6/21 and noted to have new RBBB with lengthening of both PR and QRS intervals. Flecainide was stopped and wearing cardiac monitor to determine flutter vs Afib. Patient is on Eliquis for a CHADS2VASC score of 1.  On evaluation today, he is currently in NSR. He called office on 6/25 feeling presyncopal. Review of Zio data by [facility] 7/5 showed NO significant or prolonged bradycardia. Patient noted on 7/5 to not have felt abnormal. He has noted a few paroxysmal episodes of Afib and appears to be rate controlled during episodes. He states they are brief. He has confirmed he stopped taking flecainide.  Today, he denies symptoms of palpitations, chest pain, shortness of breath, orthopnea, PND, lower extremity edema, dizziness, presyncope, syncope, snoring, daytime somnolence, bleeding, or neurologic sequela. The patient is tolerating medications without difficulties and is otherwise without complaint today.    Atrial Fibrillation Risk Factors:  he does have symptoms or diagnosis of sleep apnea. he is compliant with CPAP therapy.  he has a BMI of Body mass index is 33.92 kg/m.[name] Filed Weights   03/09/23 1128  Weight: 123.1 kg    Current Outpatient Medications  Medication Sig Dispense Refill   apixaban (ELIQUIS) 5 MG TABS tablet Take 1 tablet (5 mg total) by mouth 2 (two) times daily. 180 tablet 3   b complex vitamins capsule Take 1 capsule by mouth daily.     bisacodyl (DULCOLAX) 5 MG EC  tablet Take 5 mg by mouth as needed.     cholecalciferol (VITAMIN D3) 25 MCG (1000 UNIT) tablet Take 1,000 Units by mouth daily.     diclofenac Sodium (VOLTAREN) 1 % GEL Apply 4 g topically 4 (four) times daily as needed. 100 g 3   diltiazem (CARDIZEM CD) 180 MG 24 hr capsule TAKE ONE CAPSULE BY MOUTH DAILY 90 capsule 2   indapamide (LOZOL) 2.5 MG tablet Take 2.5 mg by mouth daily.     Iron, Ferrous Sulfate, 325 (65 Fe) MG TABS Take 325 mg by mouth every other day.     Multiple Vitamin (MULTIVITAMIN) tablet Take 1 tablet by mouth daily.     nitroGLYCERIN (NITROSTAT) 0.4 MG SL tablet Place 1 tablet (0.4 mg total) under the tongue every 5 (five) minutes as needed for chest pain (Max 3 doses). 25 tablet 2   Omega-3 1000 MG CAPS Take 1,000 mg by mouth daily.     polyethylene glycol (MIRALAX / GLYCOLAX) 17 g packet Take 17 g by mouth daily.     rosuvastatin (CRESTOR) 20 MG tablet Take 1 tablet (20 mg total) by mouth daily. 90 tablet 3   tamsulosin (FLOMAX) 0.4 MG CAPS capsule Take 1 capsule by mouth daily.     triamcinolone cream (KENALOG) 0.1 % Apply 1 Application topically 2 (two) times daily. 30 g 0   No current facility-administered medications for this encounter.    Atrial Fibrillation Management history:  Previous antiarrhythmic drugs: flecainide Previous cardioversions: None Previous ablations: flutter ablation 01/16/2022 Anticoagulation history:  Eliquis   ROS- All systems are reviewed and negative except as per the HPI above.  Physical Exam: BP (!) 142/72   Pulse (!) 56   Ht 6\' 3"  (1.905 m)   Wt 123.1 kg   BMI 33.92 kg/m   GEN: Well nourished, well developed in no acute distress NECK: No JVD; No carotid bruits CARDIAC: Regular rate and rhythm, no murmurs, rubs, gallops RESPIRATORY:  Clear to auscultation without rales, wheezing or rhonchi  ABDOMEN: Soft, non-tender, non-distended EXTREMITIES:  No edema; No deformity   EKG today demonstrates  Vent. rate 56 BPM PR interval  204 ms QRS duration 128 ms QT/QTcB 452/436 ms P-R-T axes 90 91 28  Suspect arm lead reversal, interpretation assumes no reversal Sinus bradycardia with Premature atrial complexes Right bundle branch block Abnormal ECG When compared with ECG of 22-Feb-2023 14:24, PREVIOUS ECG IS PRESENT  Echo 12/06/21 demonstrated  1. Left ventricular ejection fraction, by estimation, is 65 to 70%. The  left ventricle has hyperdynamic function. The left ventricle has no  regional wall motion abnormalities. There is mild concentric left  ventricular hypertrophy. Left ventricular  diastolic function could not be evaluated.   2. Right ventricular systolic function was not well visualized. The right  ventricular size is normal. There is normal pulmonary artery systolic  pressure.   3. The mitral valve is normal in structure. Mild mitral valve  regurgitation. No evidence of mitral stenosis.   4. The aortic valve is normal in structure. Aortic valve regurgitation is  not visualized. No aortic stenosis is present.   5. The inferior vena cava is dilated in size with >50% respiratory  variability, suggesting right atrial pressure of 8 mmHg.   ASSESSMENT & PLAN CHA2DS2-VASc Score = 3  The patient's score is based upon: CHF History: 0 HTN History: 1 Diabetes History: 0 Stroke History: 0 Vascular Disease History: 1 Age Score: 1 Gender Score: 0       ASSESSMENT AND PLAN: Paroxysmal Atrial Fibrillation (ICD10:  I48.0) / Hx typical atrial flutter / atypical atrial flutter The patient's CHA2DS2-VASc score is 3, indicating a 3.2% annual risk of stroke.    He is in NSR today.   We discussed Tikosyn admission at length, to contact insurance for pricing, and we also discussed ablation as a procedural option for treatment pending monitor results. If he chose Tikosyn, would have to transition from indapamide which notes to be contraindicated with Tikosyn. Qtc 436 ms in NSR. After discussion, given Tikosyn  information sheet and patient and wife will wait to discuss monitor results before proceeding with plan of treatment.  Secondary Hypercoagulable State (ICD10:  D68.69) The patient is at significant risk for stroke/thromboembolism based upon his CHA2DS2-VASc Score of 3.  Continue Apixaban (Eliquis).  No missed doses.    Follow up as scheduled with Francis Dowse, PA-C.   [place] Bells, PA-C  [facility] [address] [place], [place] [address] [phone]

## 2023-03-19 ENCOUNTER — Other Ambulatory Visit: Payer: Self-pay

## 2023-03-19 ENCOUNTER — Emergency Department (HOSPITAL_BASED_OUTPATIENT_CLINIC_OR_DEPARTMENT_OTHER)
Admission: EM | Admit: 2023-03-19 | Discharge: 2023-03-19 | Disposition: A | Discharge status: Home / Self Care | Payer: Medicare Other | Attending: Emergency Medicine | Admitting: Emergency Medicine

## 2023-03-19 ENCOUNTER — Encounter (HOSPITAL_BASED_OUTPATIENT_CLINIC_OR_DEPARTMENT_OTHER): Payer: Self-pay

## 2023-03-19 ENCOUNTER — Telehealth: Payer: Self-pay | Admitting: Internal Medicine

## 2023-03-19 DIAGNOSIS — I4892 Unspecified atrial flutter: Secondary | ICD-10-CM | POA: Diagnosis not present

## 2023-03-19 DIAGNOSIS — Z91040 Latex allergy status: Secondary | ICD-10-CM | POA: Diagnosis not present

## 2023-03-19 DIAGNOSIS — Z7901 Long term (current) use of anticoagulants: Secondary | ICD-10-CM | POA: Diagnosis not present

## 2023-03-19 DIAGNOSIS — Z87891 Personal history of nicotine dependence: Secondary | ICD-10-CM | POA: Diagnosis not present

## 2023-03-19 DIAGNOSIS — R002 Palpitations: Secondary | ICD-10-CM | POA: Diagnosis not present

## 2023-03-19 LAB — CBC WITH DIFFERENTIAL/PLATELET
Abs Immature Granulocytes: 0.06 10*3/uL (ref 0.00–0.07)
Basophils Absolute: 0.1 10*3/uL (ref 0.0–0.1)
Basophils Relative: 1 %
Eosinophils Absolute: 0.6 10*3/uL — ABNORMAL HIGH (ref 0.0–0.5)
Eosinophils Relative: 7 %
HCT: 44.7 % (ref 39.0–52.0)
Hemoglobin: 15 g/dL (ref 13.0–17.0)
Immature Granulocytes: 1 %
Lymphocytes Relative: 11 %
Lymphs Abs: 1 10*3/uL (ref 0.7–4.0)
MCH: 29.6 pg (ref 26.0–34.0)
MCHC: 33.6 g/dL (ref 30.0–36.0)
MCV: 88.2 fL (ref 80.0–100.0)
Monocytes Absolute: 0.8 10*3/uL (ref 0.1–1.0)
Monocytes Relative: 9 %
Neutro Abs: 6.3 10*3/uL (ref 1.7–7.7)
Neutrophils Relative %: 71 %
Platelets: 206 10*3/uL (ref 150–400)
RBC: 5.07 MIL/uL (ref 4.22–5.81)
RDW: 14.3 % (ref 11.5–15.5)
WBC: 8.9 10*3/uL (ref 4.0–10.5)
nRBC: 0 % (ref 0.0–0.2)

## 2023-03-19 LAB — COMPREHENSIVE METABOLIC PANEL
ALT: 20 U/L (ref 0–44)
AST: 20 U/L (ref 15–41)
Albumin: 4 g/dL (ref 3.5–5.0)
Alkaline Phosphatase: 70 U/L (ref 38–126)
Anion gap: 10 (ref 5–15)
BUN: 18 mg/dL (ref 8–23)
CO2: 24 mmol/L (ref 22–32)
Calcium: 8.8 mg/dL — ABNORMAL LOW (ref 8.9–10.3)
Chloride: 103 mmol/L (ref 98–111)
Creatinine, Ser: 0.98 mg/dL (ref 0.61–1.24)
GFR, Estimated: >60 mL/min (ref >60)
Glucose, Bld: 127 mg/dL — ABNORMAL HIGH (ref 70–99)
Potassium: 3.9 mmol/L (ref 3.5–5.1)
Sodium: 137 mmol/L (ref 135–145)
Total Bilirubin: 0.9 mg/dL (ref 0.3–1.2)
Total Protein: 7.3 g/dL (ref 6.5–8.1)

## 2023-03-19 LAB — MAGNESIUM: Magnesium: 1.8 mg/dL (ref 1.7–2.4)

## 2023-03-19 MED ORDER — DILTIAZEM HCL-DEXTROSE 125-5 MG/125ML-% IV SOLN (PREMIX)
INTRAVENOUS | Status: AC
  Filled 2023-03-19: qty 125

## 2023-03-19 MED ORDER — PROPOFOL 10 MG/ML IV BOLUS
0.5000 mg/kg | Freq: Once | INTRAVENOUS | Status: AC
  Administered 2023-03-19: 40 mg via INTRAVENOUS
  Filled 2023-03-19: qty 20

## 2023-03-19 MED ORDER — DILTIAZEM HCL-DEXTROSE 125-5 MG/125ML-% IV SOLN (PREMIX)
5.0000 mg/h | INTRAVENOUS | Status: DC
  Administered 2023-03-19: 5 mg/h via INTRAVENOUS

## 2023-03-19 NOTE — ED Notes (Signed)
Pt requested something to eat and drink. Per MD it is ok for pt to eat

## 2023-03-19 NOTE — ED Notes (Signed)
Verbal order for Propofol 20mg  to be given

## 2023-03-19 NOTE — ED Notes (Signed)
Verbal order for another 20mg  of propofol to be given per MD order

## 2023-03-19 NOTE — ED Triage Notes (Signed)
Patient presents to ED via POV from home. Here with tachycardiac. History of a fib. Denies pain. Mild shortness of breath. Able to speak full, complete sentences.

## 2023-03-19 NOTE — Telephone Encounter (Signed)
STAT if HR is under 50 or over 120 (normal HR is 60-100 beats per minute)  What is your heart rate? 017-510  Do you have a log of your heart rate readings (document readings)? 116-148  Do you have any other symptoms? Patient states that his HR has been high for over the last hour.  Wanting to know if he should head to [facility].

## 2023-03-19 NOTE — ED Notes (Signed)
Increased dose to 10mg  /hour per order

## 2023-03-19 NOTE — ED Provider Notes (Signed)
[facility] Provider Note   CSN: [id number] Arrival date & time: 03/19/23  1515     History  Chief Complaint  Patient presents with   Tachycardia    Richard Vazquez is a 67 y.o. male.  The history is provided by the patient and the spouse.  Palpitations Palpitations quality:  Fast Onset quality:  Sudden Timing:  Constant Progression:  Worsening Chronicity:  Recurrent Worsened by:  Nothing Associated symptoms: no shortness of breath and no syncope   Patient with history of paroxysmal atrial flutter on Eliquis, obesity presents with palpitations.  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  No chest pain or shortness of breath. No syncope.  He has been compliant with Eliquis for months and has not missed any doses.  He has been seen by cardiology recently was noted to have normal rhythm at that time, he is considering taking Tikosyn but has not started   Patient has had ablation and cardioversions previously Past Medical History:  Diagnosis Date   Acute pain of right knee 05/16/2022   Carotid artery disease (HCC)    Carotid US 09/01/2022: R 40-59, L 1-39   Former smoker 12/21/2015   Hyperlipemia    Kidney stones    Left carotid bruit 08/05/2021   Obesity    Paroxysmal atrial flutter (HCC) 05/29/2021    Home Medications Prior to Admission medications   Medication Sig Start Date End Date Taking? Authorizing Provider  apixaban (ELIQUIS) 5 MG TABS tablet Take 1 tablet (5 mg total) by mouth 2 (two) times daily. 09/25/22  Yes Pricilla Riffle, MD  b complex vitamins capsule Take 1 capsule by mouth daily.   Yes [provider]  cholecalciferol (VITAMIN D3) 25 MCG (1000 UNIT) tablet Take 1,000 Units by mouth daily. 10/12/22  Yes [provider]  diltiazem (CARDIZEM CD) 180 MG 24 hr capsule TAKE ONE CAPSULE BY MOUTH DAILY 06/21/22  Yes Pricilla Riffle, MD  indapamide (LOZOL) 2.5 MG tablet Take 2.5 mg by mouth daily. 10/21/21  Yes [provider]  Iron, Ferrous Sulfate, 325 (65 Fe) MG TABS Take 325 mg by mouth every other day.   Yes [provider]  Multiple Vitamin (MULTIVITAMIN) tablet Take 1 tablet by mouth daily.   Yes [provider]  Omega-3 1000 MG CAPS Take 1,000 mg by mouth daily.   Yes [provider]  polyethylene glycol (MIRALAX / GLYCOLAX) 17 g packet Take 17 g by mouth daily.   Yes [provider]  tamsulosin (FLOMAX) 0.4 MG CAPS capsule Take 1 capsule by mouth daily. 09/04/22  Yes [provider]  bisacodyl (DULCOLAX) 5 MG EC tablet Take 5 mg by mouth as needed. 07/07/22   [provider]  diclofenac Sodium (VOLTAREN) 1 % GEL Apply 4 g topically 4 (four) times daily as needed. 05/16/22   Garnette Gunner, MD  nitroGLYCERIN (NITROSTAT) 0.4 MG SL tablet Place 1 tablet (0.4 mg total) under the tongue every 5 (five) minutes as needed for chest pain (Max 3 doses). 09/25/22   Pricilla Riffle, MD  rosuvastatin (CRESTOR) 20 MG tablet Take 1 tablet (20 mg total) by mouth daily. 08/18/22 03/09/23  Pricilla Riffle, MD  triamcinolone cream (KENALOG) 0.1 % Apply 1 Application topically 2 (two) times daily. 12/18/22   Garnette Gunner, MD      Allergies  Lipitor [atorvastatin], Codeine, and Latex    Review of Systems   Review of Systems  Constitutional:  Negative for fever.  Respiratory:  Negative for shortness of breath.   Cardiovascular:  Positive for palpitations. Negative for syncope.       Chronic leg swelling  Neurological:  Negative for syncope.    Physical Exam Updated Vital Signs BP 105/82 (BP Location: Right Arm)   Pulse 80   Temp 98.1 F (36.7 C) (Oral)   Resp 17   Ht 1.905 m (6\' 3" )   Wt 121.1 kg   SpO2 97%   BMI 33.37 kg/m  Physical Exam CONSTITUTIONAL: Well developed/well nourished HEAD: Normocephalic/atraumatic EYES: EOMI/PERRL ENMT: Mucous  membranes moist NECK: supple no meningeal signs CV: Tachycardic and irregular, no loud murmurs LUNGS: Lungs are clear to auscultation bilaterally, no apparent distress ABDOMEN: soft, nontender NEURO: Pt is awake/alert/appropriate, moves all extremitiesx4.  No facial droop.   EXTREMITIES: pulses normal/equal, full ROM Minimal symmetric lower extremity edema SKIN: warm, color normal PSYCH: no abnormalities of mood noted, alert and oriented to situation  ED Results / Procedures / Treatments   Labs (all labs ordered are listed, but only abnormal results are displayed) Labs Reviewed  CBC WITH DIFFERENTIAL/PLATELET - Abnormal; Notable for the following components:      Result Value   Eosinophils Absolute 0.6 (*)    All other components within normal limits  COMPREHENSIVE METABOLIC PANEL - Abnormal; Notable for the following components:   Glucose, Bld 127 (*)    Calcium 8.8 (*)    All other components within normal limits  MAGNESIUM    ED ECG REPORT   Date: 03/19/2023 1637  Rate: 124  Rhythm: atrial flutter  QRS Axis: right  Intervals: normal  ST/T Wave abnormalities: nonspecific ST changes  Conduction Disutrbances:right bundle branch block  Narrative Interpretation:   Old EKG Reviewed: changes noted  I have personally reviewed the EKG tracing and agree with the computerized printout as noted.   Radiology No results found.  Procedures .Sedation  Date/Time: 03/19/2023 6:13 PM  Performed by: Zadie Rhine, MD Authorized by: Zadie Rhine, MD   Consent:    Consent obtained:  Written   Consent given by:  Patient   Risks discussed:  Nausea, vomiting, prolonged sedation necessitating reversal, dysrhythmia and respiratory compromise necessitating ventilatory assistance and intubation Universal protocol:    Immediately prior to procedure, a time out was called: yes     Patient identity confirmed:  Arm band and provided demographic data Indications:    Procedure  necessitating sedation performed by:  Physician performing sedation Pre-sedation assessment:    Time since last food or drink:  8   ASA classification: class 2 - patient with mild systemic disease     Mallampati score:  II - soft palate, uvula, fauces visible   Neck mobility: normal     Pre-sedation assessments completed and reviewed: airway patency and cardiovascular function     Pre-sedation assessment completed:  03/19/2023 6:14 PM Immediate pre-procedure details:    Reassessment: Patient reassessed immediately prior to procedure     Reviewed: vital signs     Verified: bag valve mask available, oxygen available and suction available   Procedure details (see MAR for exact dosages):    Preoxygenation:  Nasal cannula   Sedation:  Propofol   Intended level of sedation: deep   Intra-procedure monitoring:  Blood pressure monitoring, cardiac monitor, continuous capnometry and continuous pulse oximetry   Intra-procedure events: none  Total Provider sedation time (minutes):  16 Post-procedure details:    Post-sedation assessment completed:  03/19/2023 6:53 PM   Attendance: Constant attendance by certified staff until patient recovered     Recovery: Patient returned to pre-procedure baseline     Patient is stable for discharge or admission: yes     Procedure completion:  Tolerated well, no immediate complications .Cardioversion  Date/Time: 03/19/2023 6:30 PM  Performed by: Zadie Rhine, MD Authorized by: Zadie Rhine, MD   Consent:    Consent obtained:  Written   Consent given by:  Patient   Alternatives discussed:  No treatment, rate-control medication and observation Pre-procedure details:    Cardioversion basis:  Elective   Rhythm:  Atrial flutter   Electrode placement:  Anterior-posterior Patient sedated: Yes. Refer to sedation procedure documentation for details of sedation.  Attempt one:    Cardioversion mode:  Synchronous   Waveform:  Biphasic   Shock (Joules):   200   Shock outcome:  Conversion to normal sinus rhythm Post-procedure details:    Patient status:  Alert   Patient tolerance of procedure:  Tolerated well, no immediate complications     Medications Ordered in ED Medications  diltiazem (CARDIZEM) 125 mg in dextrose 5% 125 mL (1 mg/mL) infusion (0 mg/hr Intravenous Stopped 03/19/23 1839)  propofol (DIPRIVAN) 10 mg/mL bolus/IV push 60.6 mg (40 mg Intravenous Given 03/19/23 1819)    ED Course/ Medical Decision Making/ A&P Clinical Course as of 03/19/23 1927  Mon Mar 19, 2023  1851 Patient presented for acute onset of palpitations.  He was found to be in persistent atrial flutter.  He was given Cardizem without any improvement of his heart rate or rhythm.  He has been anticoagulated for months.  His symptoms started several hours ago.  Patient agreed to proceed with cardioversion which was successful and patient feels much improved [DW]  1927 Patient back to baseline, no acute distress.  Sinus rhythm noted.  He is taking oral fluids. Advised to continue his Eliquis. He already has follow-up with cardiology [DW]    Clinical Course User Index [DW] Zadie Rhine, MD                             Medical Decision Making Amount and/or Complexity of Data Reviewed Labs: ordered. ECG/medicine tests: ordered.  Risk Prescription drug management.   This patient presents to the ED for concern of palpitations, this involves an extensive number of treatment options, and is a complaint that carries with it a high risk of complications and morbidity.  The differential diagnosis includes but is not limited to atrial fibrillation, atrial flutter, SVT, sinus tachycardia, ventricular tachycardia  Comorbidities that complicate the patient evaluation: Patient's presentation is complicated by their history of atrial flutter and obesity  Additional history obtained: Additional history obtained from spouse Records reviewed  cardiology notes  reviewed  Lab Tests: I Ordered, and personally interpreted labs.  The pertinent results include: Mild hyperglycemia  Cardiac Monitoring: The patient was maintained on a cardiac monitor.  I personally viewed and interpreted the cardiac monitor which showed an underlying rhythm of:  Atrial Flutter  Medicines ordered and prescription drug management: I ordered medication including Cardizem for tachycardia Reevaluation of the patient after these medicines showed that the patient    stayed the same   Critical Interventions:   electrical cardioversion   Reevaluation: After the interventions noted above, I reevaluated the patient and found that they have :  improved  Complexity of problems addressed: Patient's presentation is most consistent with  acute presentation with potential threat to life or bodily function  Disposition: After consideration of the diagnostic results and the patient's response to treatment,  I feel that the patent would benefit from discharge   .           Final Clinical Impression(s) / ED Diagnoses Final diagnoses:  Atrial flutter, unspecified type [facility])    Rx / DC Orders ED Discharge Orders          Ordered    Amb referral to AFIB Clinic        03/19/23 1747              Zadie Rhine, MD 03/19/23 1927

## 2023-03-19 NOTE — Telephone Encounter (Signed)
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 Pt states his device shows a 148 pulse and in afib. Pt says he is not really dizzy or having pain but just doesn't feel right and wanted to see if we thought he show go to [facility]. Instructed Pt that I felt that would be a good idea to go now and get looked over. Pt stated his wife would take him.

## 2023-03-19 NOTE — ED Notes (Signed)
Shock administered 

## 2023-03-19 NOTE — ED Notes (Signed)
Reviewed AVS with patient, patient expressed understanding of directions, denies further questions at this time. 

## 2023-03-19 NOTE — ED Notes (Signed)
Patient resting quietly in stretcher, respirations even, unlabored, no acute distress noted. Denies needs at this time.  

## 2023-03-20 ENCOUNTER — Telehealth: Payer: Self-pay

## 2023-03-20 NOTE — Transitions of Care (Post Inpatient/ED Visit) (Signed)
03/20/2023  Name: Richard Vazquez MRN: [id number] DOB: Dec 29, 1955  Today's TOC FU Call Status: Today's TOC FU Call Status:: Successful TOC FU Call Competed TOC FU Call Complete Date: 03/20/23  Transition Care Management Follow-up Telephone Call Date of Discharge: 03/19/23 Discharge Facility: [facility] Type of Discharge: Emergency Department How have you been since you were released from the hospital?: Better  Items Reviewed: Did you receive and understand the discharge instructions provided?: Yes Any new allergies since your discharge?: No Dietary orders reviewed?: No Do you have support at home?: Yes  Medications Reviewed Today: Medications Reviewed Today     Reviewed by Free, Lloyd Huger, RN (Registered Nurse) on 03/19/23 at 1718  Med List Status: <None>   Medication Order Taking? Sig Documenting Provider Last Dose Status Informant  apixaban (ELIQUIS) 5 MG TABS tablet [id number] Yes Take 1 tablet (5 mg total) by mouth 2 (two) times daily. Pricilla Riffle, MD 03/19/2023 Active   b complex vitamins capsule [id number] Yes Take 1 capsule by mouth daily. [provider] 03/18/2023 Active Self  bisacodyl (DULCOLAX) 5 MG EC tablet [id number] No Take 5 mg by mouth as needed. [provider] Unknown Active   cholecalciferol (VITAMIN D3) 25 MCG (1000 UNIT) tablet [id number] Yes Take 1,000 Units by mouth daily. [provider] 03/18/2023 Active   diclofenac Sodium (VOLTAREN) 1 % GEL [id number] No Apply 4 g topically 4 (four) times daily as needed. Garnette Gunner, MD Unknown Active   diltiazem [facility] CD) 180 MG 24 hr capsule [id number] Yes TAKE ONE CAPSULE BY MOUTH DAILY Pricilla Riffle, MD 03/19/2023 Active   indapamide (LOZOL) 2.5 MG tablet [id number] Yes Take 2.5 mg by mouth daily. [provider] 03/19/2023 Active Self  Iron, Ferrous Sulfate, 325 (65 Fe) MG TABS [id number] Yes Take 325 mg by mouth every other day. [provider] 03/18/2023 Active  Self  Multiple Vitamin (MULTIVITAMIN) tablet [id number] Yes Take 1 tablet by mouth daily. [provider] 03/18/2023 Active Self  nitroGLYCERIN (NITROSTAT) 0.4 MG SL tablet [id number] No Place 1 tablet (0.4 mg total) under the tongue every 5 (five) minutes as needed for chest pain (Max 3 doses). Pricilla Riffle, MD Unknown Active   Omega-3 1000 MG CAPS [id number] Yes Take 1,000 mg by mouth daily. [provider] 03/18/2023 Active Self  polyethylene glycol (MIRALAX / GLYCOLAX) 17 g packet [id number] Yes Take 17 g by mouth daily. [provider] 03/18/2023 Active   rosuvastatin (CRESTOR) 20 MG tablet [id number]  Take 1 tablet (20 mg total) by mouth daily. Pricilla Riffle, MD  Expired 03/09/23 2359   tamsulosin (FLOMAX) 0.4 MG CAPS capsule [id number] Yes Take 1 capsule by mouth daily. [provider] 03/19/2023 Active   triamcinolone cream (KENALOG) 0.1 % [id number] No Apply 1 Application topically 2 (two) times daily. Garnette Gunner, MD Unknown Active             Home Care and Equipment/Supplies: Were Home Health Services Ordered?: NA Any new equipment or medical supplies ordered?: NA  Functional Questionnaire: Do you need assistance with bathing/showering or dressing?: No Do you need assistance with meal preparation?: No Do you need assistance with eating?: No Do you have difficulty maintaining continence: No Do you need assistance with getting out of bed/getting out of a chair/moving?: No Do you have difficulty managing or taking your medications?: No  Follow up appointments reviewed: PCP Follow-up appointment confirmed?: No Specialist Hospital Follow-up appointment confirmed?: Yes Date of Specialist follow-up appointment?:  03/29/23 Follow-Up Specialty Provider:: Keitha Butte Do you need transportation to your follow-up appointment?: No Do you understand care options if your condition(s) worsen?: Yes-patient verbalized understanding    SIGNATURE Arvil Persons, BSN, RN

## 2023-03-24 IMAGING — DX DG CHEST 2V
2 series · 2 of 2 positions shown · non-contrast
Comparison: Chest XR, 05/29/2021.  CT AP, 11/11/2021.

CLINICAL DATA: Chest pain

EXAM:
CHEST - 2 VIEW

[chest pa]
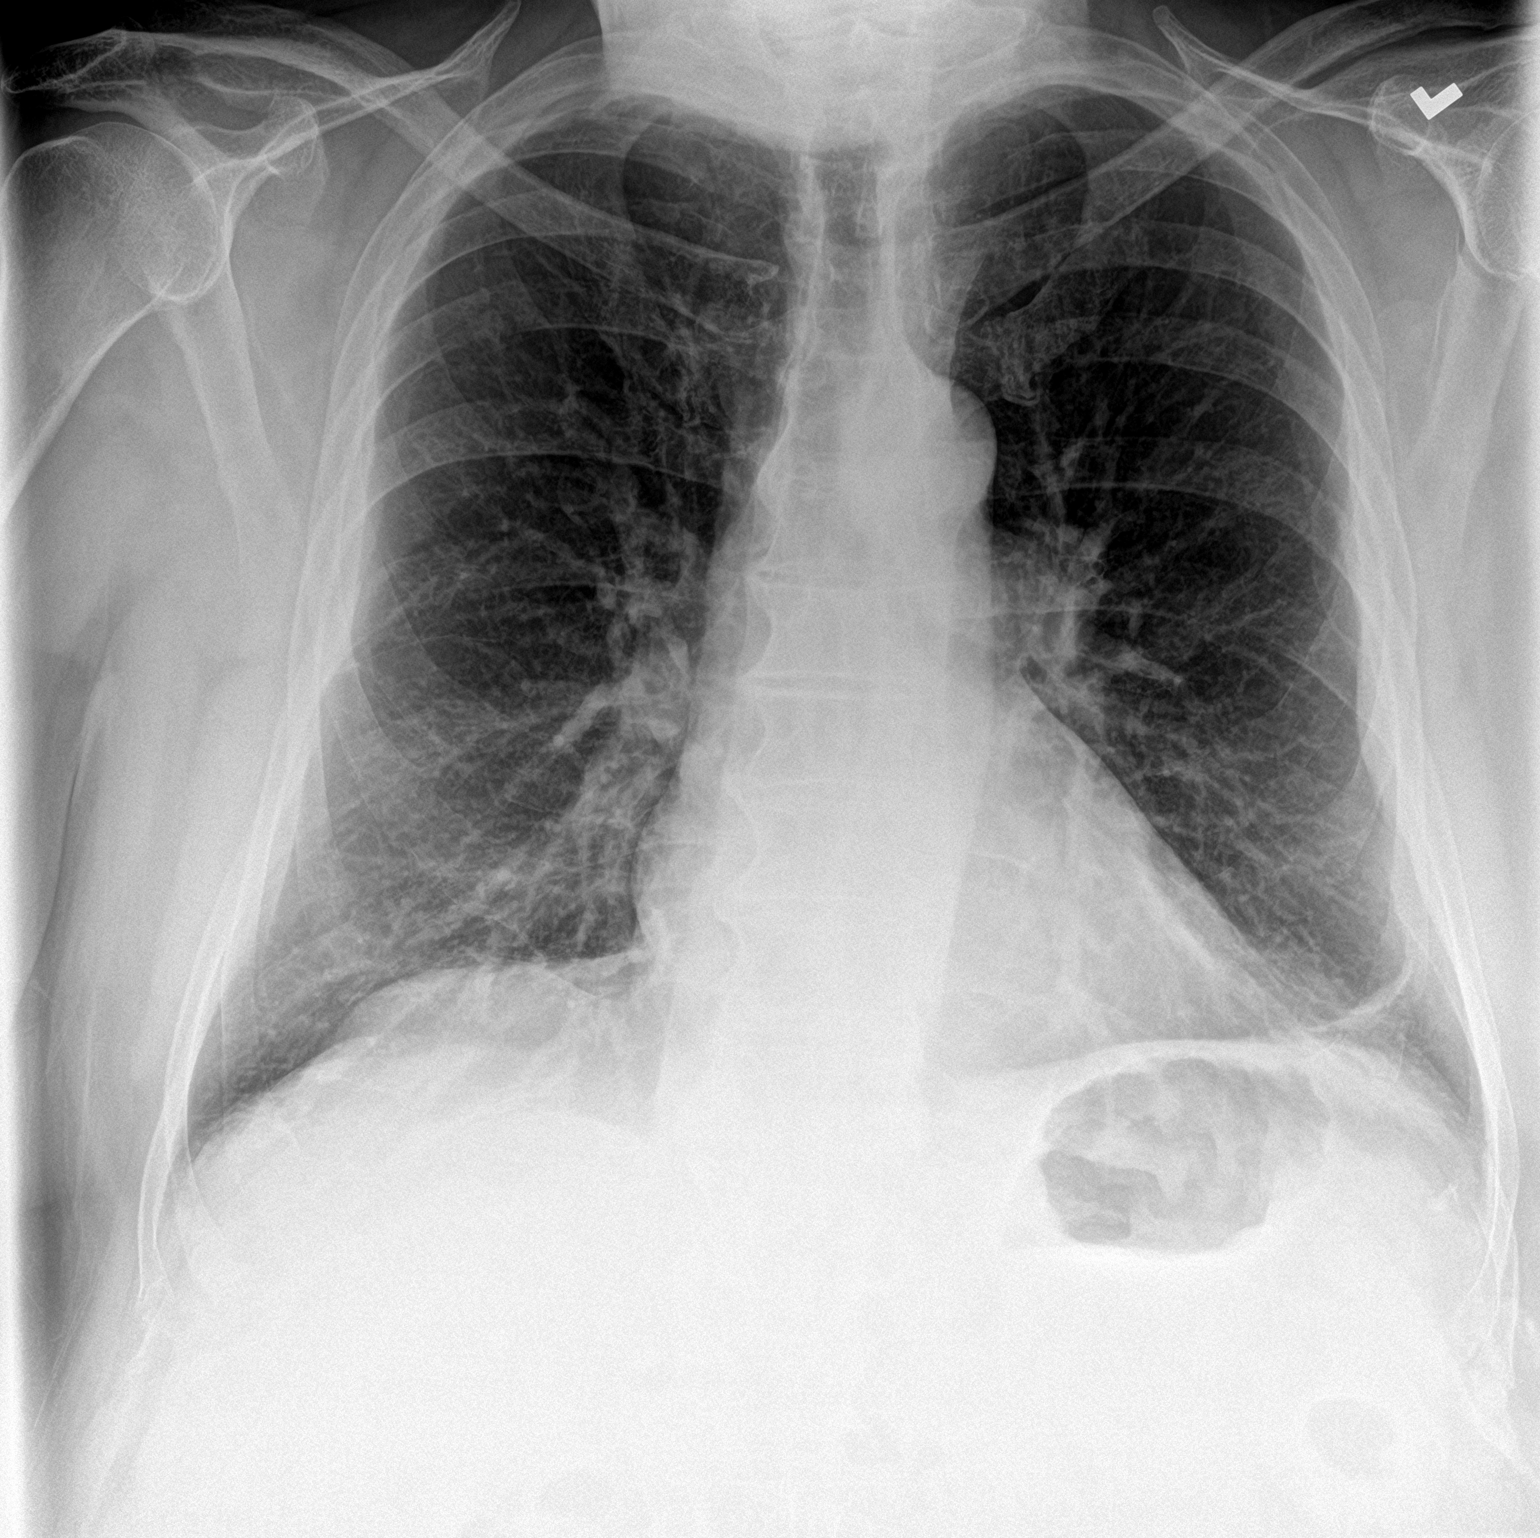

[chest lat]
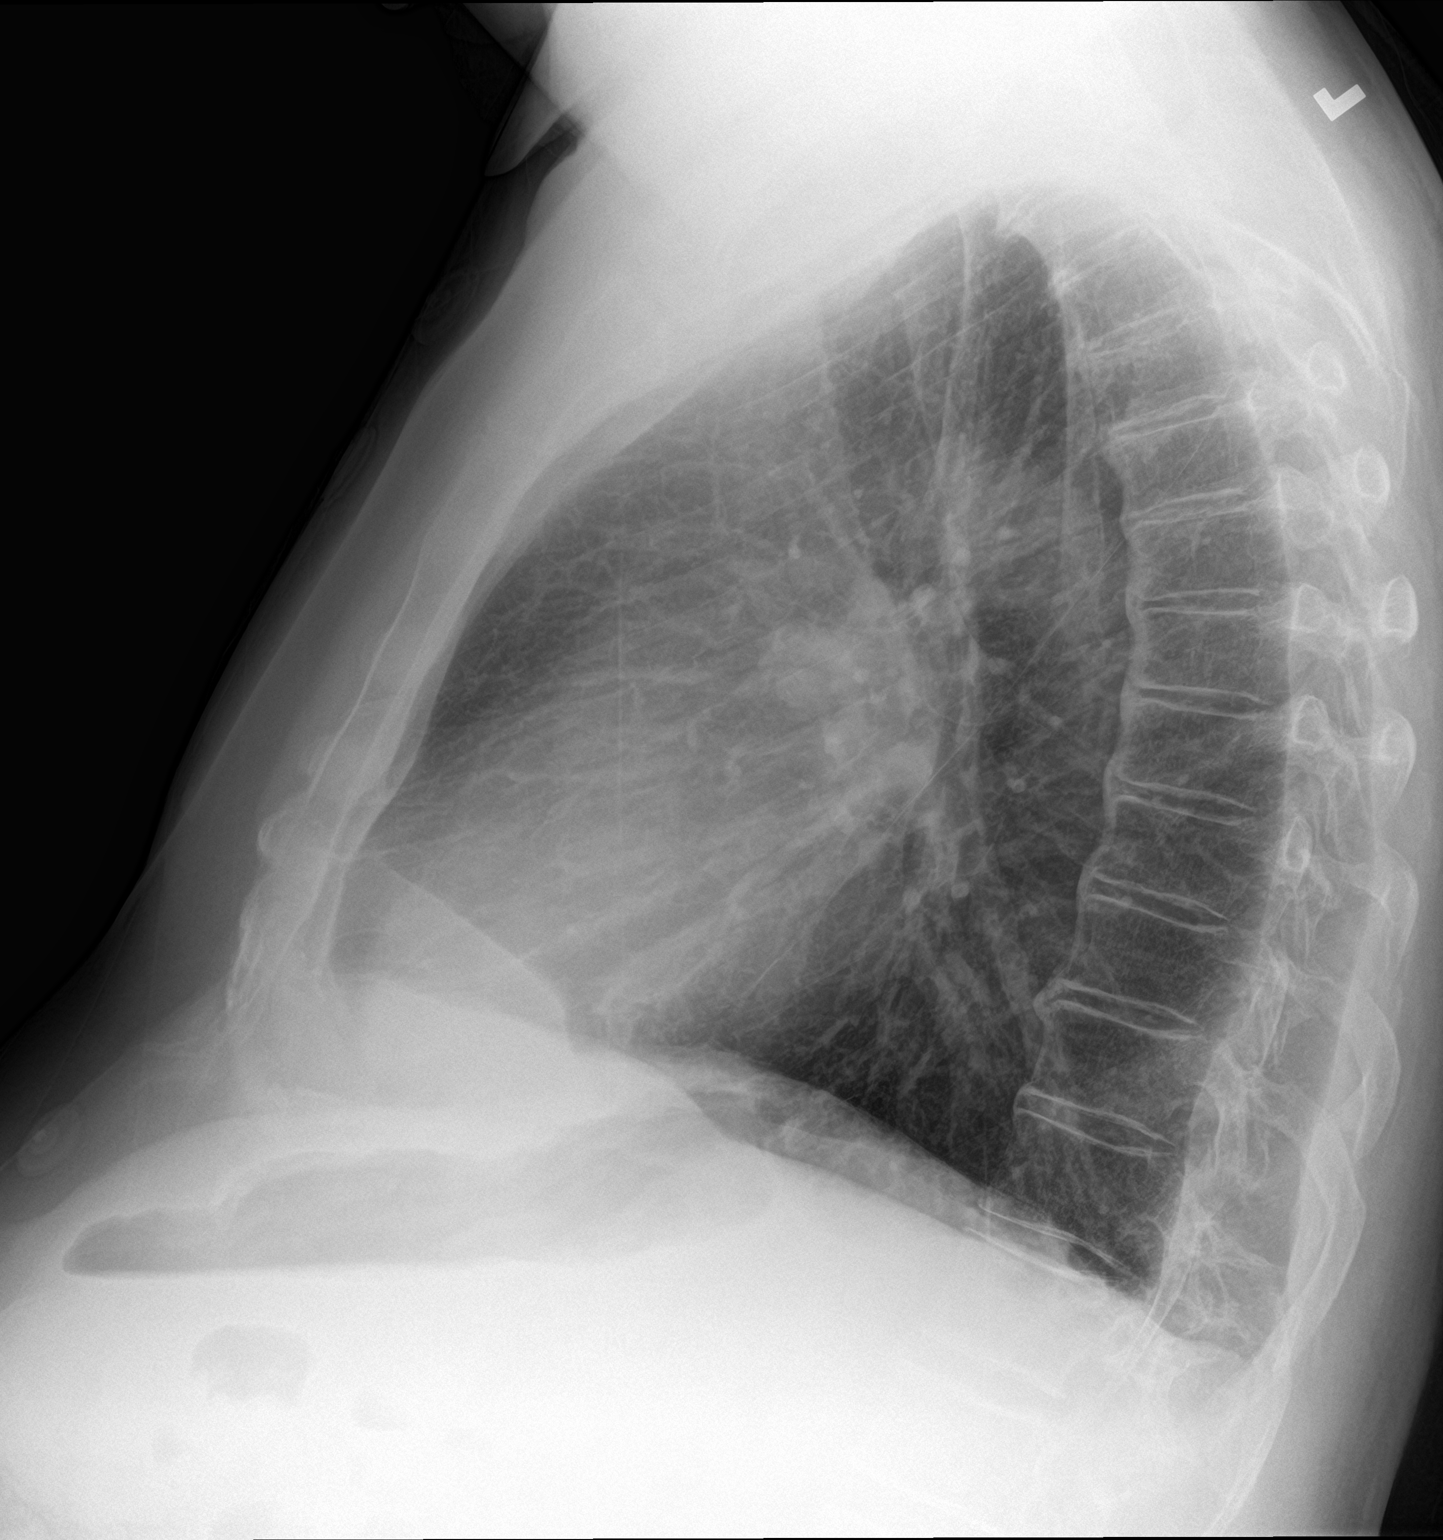

[2 of 2 positions shown; findings below may reference images not displayed]

FINDINGS: Cardiomediastinal silhouette is within normal limits. Lungs are well
inflated. Trace linear opacity at the LEFT lung base no focal
consolidation or mass. No pleural effusion or pneumothorax. No acute
displaced fracture.
IMPRESSION: No active cardiopulmonary disease.

## 2023-03-26 ENCOUNTER — Other Ambulatory Visit: Payer: Self-pay | Admitting: *Deleted

## 2023-03-26 MED ORDER — DILTIAZEM HCL ER COATED BEADS 180 MG PO CP24: 180.0000 mg | ORAL_CAPSULE | Freq: Every day | ORAL | 1 refills | Status: DC

## 2023-03-28 ENCOUNTER — Encounter (INDEPENDENT_AMBULATORY_CARE_PROVIDER_SITE_OTHER): Payer: Self-pay

## 2023-03-28 ENCOUNTER — Ambulatory Visit (INDEPENDENT_AMBULATORY_CARE_PROVIDER_SITE_OTHER): Payer: Medicare Other | Admitting: Internal Medicine

## 2023-03-28 ENCOUNTER — Encounter: Payer: Self-pay | Admitting: Internal Medicine

## 2023-03-28 VITALS — BP 130/62 | HR 61 | Temp 98.4°F | Ht 75.0 in | Wt 272.0 lb

## 2023-03-28 DIAGNOSIS — S0991XA Unspecified injury of ear, initial encounter: Secondary | ICD-10-CM | POA: Diagnosis not present

## 2023-03-28 NOTE — Progress Notes (Signed)
[facility] [address] [place] [address] Dept: [phone] Dept Fax: [phone]  Acute Care Office Visit  Subjective:   Richard Vazquez 24-Dec-1955 03/28/2023  Chief Complaint  Patient presents with   Ear Injury    HPI: Discussed the use of AI scribe software for clinical note transcription with the patient, who gave verbal consent to proceed.  History of Present Illness   The patient presents with a one-day history of right ear bleeding. He woke up to find blood on his pillow and when he touched his ear, he noticed it was full of blood. He denies any pain, dizziness, hearing loss, fever, or recent trauma to the ear. He also denies any recent illnesses, such as a sore throat, cough, or congestion. He does report occasional itching in both ears and he will use his fingernails to scratch area. He also has a history of psoriasis and sometimes experiences flaky, itchy skin around the ears.      The following portions of the patient's history were reviewed and updated as appropriate: past medical history, past surgical history, family history, social history, allergies, medications, and problem list.   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   OSA (obstructive sleep apnea) 12/14/2021   Renal stones 12/14/2021   Prediabetes 12/14/2021   Chronic constipation 12/14/2021   CAD (coronary artery disease) 12/08/2021   Persistent atrial fibrillation (HCC)    Carotid artery disease (HCC) 08/29/2021   Hematuria 08/05/2021   Atrial flutter (HCC) 05/29/2021   Class 2 obesity due to excess calories without serious comorbidity with body mass index (BMI) of 35.0 to 35.9 in adult 01/12/2020   Hyperlipemia 06/05/2014   Past Medical History:  Diagnosis  Date   Acute pain of right knee 05/16/2022   Carotid artery disease (HCC)    Carotid US 09/01/2022: R 40-59, L 1-39   Former smoker 12/21/2015   Hyperlipemia    Kidney stones    Left carotid bruit 08/05/2021   Obesity    Paroxysmal atrial flutter (HCC) 05/29/2021   Past Surgical History:  Procedure Laterality Date   A-FLUTTER ABLATION N/A 01/16/2022   Procedure: A-FLUTTER ABLATION;  Surgeon: Duke Salvia, MD;  Location: [facility] INVASIVE CV LAB;  Service: Cardiovascular;  Laterality: N/A;   APPENDECTOMY     CARDIOVERSION N/A 05/31/2021   Procedure: CARDIOVERSION;  Surgeon: Chrystie Nose, MD;  Location: [facility] ENDOSCOPY;  Service: Cardiovascular;  Laterality: N/A;   CARDIOVERSION N/A 10/05/2021   Procedure: CARDIOVERSION;  Surgeon: Chrystie Nose, MD;  Location: [facility] ENDOSCOPY;  Service: Cardiovascular;  Laterality: N/A;   LEFT HEART CATH AND CORONARY ANGIOGRAPHY N/A 12/07/2021   Procedure: LEFT HEART CATH AND CORONARY ANGIOGRAPHY;  Surgeon: [name], MD;  Location: [facility] INVASIVE CV LAB;  Service: Cardiovascular;  Laterality: N/A;   TEE WITHOUT CARDIOVERSION N/A 05/31/2021   Procedure: TRANSESOPHAGEAL ECHOCARDIOGRAM (TEE);  Surgeon: Chrystie Nose, MD;  Location: [facility] ENDOSCOPY;  Service: Cardiovascular;  Laterality: N/A;   No family history on file. Outpatient Medications Prior to Visit  Medication Sig Dispense Refill   apixaban (ELIQUIS) 5 MG TABS tablet Take 1 tablet (5 mg total) by mouth 2 (two) times daily. 180 tablet 3   b complex vitamins capsule Take 1 capsule by mouth daily.  bisacodyl (DULCOLAX) 5 MG EC tablet Take 5 mg by mouth as needed.     cholecalciferol (VITAMIN D3) 25 MCG (1000 UNIT) tablet Take 1,000 Units by mouth daily.     diclofenac Sodium (VOLTAREN) 1 % GEL Apply 4 g topically 4 (four) times daily as needed. 100 g 3   diltiazem (CARDIZEM CD) 180 MG 24 hr capsule Take 1 capsule (180 mg total) by mouth daily. 90 capsule 1   indapamide (LOZOL) 2.5 MG tablet Take 2.5  mg by mouth daily.     Iron, Ferrous Sulfate, 325 (65 Fe) MG TABS Take 325 mg by mouth every other day.     Multiple Vitamin (MULTIVITAMIN) tablet Take 1 tablet by mouth daily.     nitroGLYCERIN (NITROSTAT) 0.4 MG SL tablet Place 1 tablet (0.4 mg total) under the tongue every 5 (five) minutes as needed for chest pain (Max 3 doses). 25 tablet 2   Omega-3 1000 MG CAPS Take 1,000 mg by mouth daily.     polyethylene glycol (MIRALAX / GLYCOLAX) 17 g packet Take 17 g by mouth daily.     tamsulosin (FLOMAX) 0.4 MG CAPS capsule Take 1 capsule by mouth daily.     triamcinolone cream (KENALOG) 0.1 % Apply 1 Application topically 2 (two) times daily. 30 g 0   rosuvastatin (CRESTOR) 20 MG tablet Take 1 tablet (20 mg total) by mouth daily. 90 tablet 3   No facility-administered medications prior to visit.   Allergies  Allergen Reactions   Lipitor [Atorvastatin] Other (See Comments)    Myalgias   Codeine Itching   Latex Itching     ROS: A complete ROS was performed with pertinent positives/negatives noted in the HPI. The remainder of the ROS are negative.    Objective:   Today's Vitals   03/28/23 1412  BP: 130/62  Pulse: 61  Temp: 98.4 F (36.9 C)  TempSrc: Temporal  SpO2: 98%  Weight: 272 lb (123.4 kg)  Height: 6\' 3"  (1.905 m)    GENERAL: Well-appearing, in NAD. Well nourished.  SKIN: Pink, warm and dry. No rash, lesion, ulceration, or ecchymoses.  HEENT:    HEAD: Normocephalic, non-traumatic.  EYES: Conjunctive pink without exudate. PERRL, EOMI.  EARS: External ear w/o redness, swelling, masses, or lesions.  Right EAC with minimal dried blood present with superficial abrasion to 6 oclock position of aspect of ear canal.  TM's intact, translucent w/o bulging, appropriate landmarks visualized.  NECK: Trachea midline. Full ROM w/o pain or tenderness. No lymphadenopathy.  RESPIRATORY: Chest wall symmetrical. Respirations even and non-labored. NEUROLOGIC: . Steady, even gait.   PSYCH/MENTAL STATUS: Alert, oriented x 3. Cooperative, appropriate mood and affect.    No results found for any visits on 03/28/23.    Assessment & Plan:  Assessment and Plan    Ear Canal Injury: -Allow to heal on its own. - Avoid using nails to scratch ear -Return for evaluation if there is pain, muffled hearing, or a large amount of blood from the ear.   No orders of the defined types were placed in this encounter.  Lab Orders  No laboratory test(s) ordered today   No images are attached to the encounter or orders placed in the encounter.  Return if symptoms worsen or fail to improve.   Of note, portions of this note may have been created with voice recognition software Physicist, medical). While this note has been edited for accuracy, occasional wrong-word or 'sound-a-like' substitutions may have occurred due to the inherent limitations  of voice recognition software.  Salvatore Decent, FNP

## 2023-03-28 NOTE — Progress Notes (Unsigned)
Cardiology Office Note Date:  03/28/2023  Patient ID:  Richard Vazquez, DOB Apr 13, 1956, MRN [id number] PCP:  Garnette Gunner, MD  Cardiologist:  Dr Tenny Craw Electrophysiologist: Dr. Graciela Husbands    Chief Complaint:  ER f/u  History of Present Illness: Richard Vazquez is a 67 y.o. 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 It was elected to ablate his cavotricuspid isthmus; unfortunately, within a couple of weeks he had recurrence of a flutter. Flecainide was initiated   He saw Dr. Graciela Husbands 02/22/22, maintaining then SR, stable intervals  He saw Dr. Tenny Craw 09/25/22, s/p lithotripsy complicated by hematoma requiring interruption in [facility], doing well otherwise, no changes were made, planned for labs  I saw him 6//21/24 All in all doing well He works still, second shift at a warehouse, on his feet walking quite a bit with good exertional capacity, will get a lttle winded with longer distances, no difficulties with usual rounds, or ADLs No CP No near syncope or syncope. He for years has had some intermittent hematuria and hematochezia, unchanged, known renal stones Due for his colonoscopy (has been >10 years), not scheduled.  June 1st he was alerted by his watch (before he got out of bed) to a high HR, he felt his heart beat fast, though didn't feel unwell. Went about his day, felt a little "off", but OK. Ran some errands, HR was "all over the pace", 80's-130s once home went upstairs and back down, started to feel a little weak and laid down, HR then to 180s, settled and eventually back to normal rhythm. He has observed HRs in the last several months anywhere from the 40's -110's only of late with the very fast rate Reports excellent  medication compliance He has also had low HR alerts to the 40's as well, one I saw on his phone today about midnight though that is in the middle of hisshoft at work. When it is slow he does feel a little fatigued He had a new RBBB and noted prolongation of his intervals and increasing AFib burden the flecainide stopped Planned for monitoring  and f/u EKG and for alternative AAD options  He saw AFib clinic 03/09/23, doing well, reported some symptoms while wearing the monitor without particular bradycardia, minimal AFib w/CVR Discussed perhaps Tikosyn, would need to change his indapamide to an alternative diuretic. Does not appear that they made a definitve decision, with plans to   ER visit 03/19/23: woke with palpitations, rates 130's, note reports AFlutter 124 > DCCV > SR feeling better > discharged   TODAY He is accompanied by his wife today He feels well, the ER visit is the 1st in a long time that his AF (flutter) has lasted so long and stayed so fast. Typically when he has it, it is generally short lived 10 minutes or so. He feels well though otherwise. No CP, SOB He reties this month, very much looking forward to getting off mid/late shoft work and off the [employer]. His wife has been keeping a log and notes that when he is particularly stressed with work seems to be a clear trigger for him  He scratched his ear and it bleed quite a bit, otherwise no bleeding or signs of bleeding  He is inclined at this juncture to  keep an eye on his arrhythmia burden without trying a new AAD or making changes. They suspect once retired, it may settle even more   AF/AAD hx AFlutter May 2023 Ablation 01/16/22 May 2023 Flecainide post Ablation with atypical AFlutter  Flecainide stopped June 2024 with development of RBBB and increased intervals   Past Medical History:  Diagnosis Date   Acute pain of right knee 05/16/2022   Carotid artery disease (HCC)    Carotid US 09/01/2022: R 40-59, L  1-39   Former smoker 12/21/2015   Hyperlipemia    Kidney stones    Left carotid bruit 08/05/2021   Obesity    Paroxysmal atrial flutter (HCC) 05/29/2021    Past Surgical History:  Procedure Laterality Date   A-FLUTTER ABLATION N/A 01/16/2022   Procedure: A-FLUTTER ABLATION;  Surgeon: Duke Salvia, MD;  Location: [facility] INVASIVE CV LAB;  Service: Cardiovascular;  Laterality: N/A;   APPENDECTOMY     CARDIOVERSION N/A 05/31/2021   Procedure: CARDIOVERSION;  Surgeon: Chrystie Nose, MD;  Location: [facility] ENDOSCOPY;  Service: Cardiovascular;  Laterality: N/A;   CARDIOVERSION N/A 10/05/2021   Procedure: CARDIOVERSION;  Surgeon: Chrystie Nose, MD;  Location: [facility] ENDOSCOPY;  Service: Cardiovascular;  Laterality: N/A;   LEFT HEART CATH AND CORONARY ANGIOGRAPHY N/A 12/07/2021   Procedure: LEFT HEART CATH AND CORONARY ANGIOGRAPHY;  Surgeon: [name], MD;  Location: [facility] INVASIVE CV LAB;  Service: Cardiovascular;  Laterality: N/A;   TEE WITHOUT CARDIOVERSION N/A 05/31/2021   Procedure: TRANSESOPHAGEAL ECHOCARDIOGRAM (TEE);  Surgeon: Chrystie Nose, MD;  Location: [facility] ENDOSCOPY;  Service: Cardiovascular;  Laterality: N/A;    Current Outpatient Medications  Medication Sig Dispense Refill   apixaban (ELIQUIS) 5 MG TABS tablet Take 1 tablet (5 mg total) by mouth 2 (two) times daily. 180 tablet 3   b complex vitamins capsule Take 1 capsule by mouth daily.     bisacodyl (DULCOLAX) 5 MG EC tablet Take 5 mg by mouth as needed.     cholecalciferol (VITAMIN D3) 25 MCG (1000 UNIT) tablet Take 1,000 Units by mouth daily.     diclofenac Sodium (VOLTAREN) 1 % GEL Apply 4 g topically 4 (four) times daily as needed. 100 g 3   diltiazem (CARDIZEM CD) 180 MG 24 hr capsule Take 1 capsule (180 mg total) by mouth daily. 90 capsule 1   indapamide (LOZOL) 2.5 MG tablet Take 2.5 mg by mouth daily.     Iron, Ferrous Sulfate, 325 (65 Fe) MG TABS Take 325 mg by mouth every other day.     Multiple Vitamin (MULTIVITAMIN)  tablet Take 1 tablet by mouth daily.     nitroGLYCERIN (NITROSTAT) 0.4 MG SL tablet Place 1 tablet (0.4 mg total) under the tongue every 5 (five) minutes as needed for chest pain (Max 3 doses). 25 tablet 2   Omega-3 1000 MG CAPS Take 1,000 mg by mouth daily.     polyethylene glycol (MIRALAX / GLYCOLAX) 17 g packet Take 17 g by mouth daily.     rosuvastatin (CRESTOR) 20 MG tablet Take 1 tablet (20 mg total) by mouth daily. 90 tablet 3   tamsulosin (FLOMAX) 0.4 MG CAPS capsule Take 1 capsule by mouth daily.     triamcinolone cream (KENALOG) 0.1 % Apply 1 Application topically 2 (two) times daily. 30 g 0   No current facility-administered medications for this visit.    Allergies:   Lipitor [atorvastatin], Codeine, and Latex   Social History:  The patient  reports that  he quit smoking about 3 years ago. His smoking use included cigarettes. He started smoking about 43 years ago. He has a 60 pack-year smoking history. He has been exposed to tobacco smoke. He has never used smokeless tobacco. He reports that he does not currently use alcohol. He reports that he does not use drugs.   Family History:  The patient's family history is not on file.  ROS:  Please see the history of present illness.    All other systems are reviewed and otherwise negative.   PHYSICAL EXAM:  VS:  There were no vitals taken for this visit. BMI: There is no height or weight on file to calculate BMI. Well nourished, well developed, in no acute distress HEENT: normocephalic, atraumatic Neck: no JVD, carotid bruits or masses Cardiac:  RRR; no significant murmurs, no rubs, or gallops Lungs: CTA b/l, no wheezing, rhonchi or rales Abd: soft, nontender MS: no deformity or atrophy Ext: L>R edema with some chronic looking skin changes (he reports PMD recently did [place], no clots), he does have large varicose veins more so on that leg Skin: warm and dry, no rash Neuro:  No gross deficits appreciated Psych: euthymic mood, full  affect   EKG:   Personally reviewed 03/09/23: AFlutter 124bpm               AFlutter 105bpm (appears atypical)  July 2024: monitor Impression:   Predominant rhythm:  SR   44 to 114 bpm  Average HR 67 bpm   Occasional PACs (4.1% total)   Rare PACs.   3 short bursts of SVT, fastest at 5 beats for 146 bpm, longest for 7 beats at 99bpm    Triggered events corresponded to SR with PACs  03/02/22: ETT The ECG was negative for ischemia. No ST deviation was noted.   Exercise capacity was mildly impaired. Patient exercised for 5 min and 0 sec. Maximum HR of 125 bpm. MPHR 81.0 %. Peak METS 7.0 .   Hypertensive response to exercise.    01/16/22: EPS/ablation Impression: #1-normal sinus node function #2 abnormal atrial function manifested by sustained atrial flutter. Catheter ablation successfully eliminated the substrate for isthmus dependent flutter #3-normal AV nodal function #4-normal His-Purkinje system function #5-no evidence of accessory pathway #6-normal ventricular function as described above.  Pt had a variety of post catheter arrhythmia including different flutters. It was elected to porceed as his clinical rhythm had been typical atrial flutter and this had prompted repeated hospital therapies    12/06/21: TTE 1. Left ventricular ejection fraction, by estimation, is 65 to 70%. The  left ventricle has hyperdynamic function. The left ventricle has no  regional wall motion abnormalities. There is mild concentric left  ventricular hypertrophy. Left ventricular  diastolic function could not be evaluated.   2. Right ventricular systolic function was not well visualized. The right  ventricular size is normal. There is normal pulmonary artery systolic  pressure.   3. The mitral valve is normal in structure. Mild mitral valve  regurgitation. No evidence of mitral stenosis.   4. The aortic valve is normal in structure. Aortic valve regurgitation is  not visualized. No aortic stenosis is  present.   5. The inferior vena cava is dilated in size with >50% respiratory  variability, suggesting right atrial pressure of 8 mmHg.    Recent Labs: 09/25/2022: NT-Pro BNP 58 03/19/2023: ALT 20; BUN 18; Creatinine, Ser 0.98; Hemoglobin 15.0; Magnesium 1.8; Platelets 206; Potassium 3.9; Sodium 137  No results found for requested  labs within last 365 days.   Estimated Creatinine Clearance: 102.5 mL/min (by C-G formula based on SCr of 0.98 mg/dL).   Wt Readings from Last 3 Encounters:  03/19/23 267 lb (121.1 kg)  03/09/23 271 lb 6.4 oz (123.1 kg)  02/22/23 269 lb 12.8 oz (122.4 kg)     Other studies reviewed: Additional studies/records reviewed today include: summarized above  ASSESSMENT AND PLAN:  AFlutter (atypical) CTI ablation 2023 CHA2DS2Vasc is one, on Eliquis, appropriately dosed  He would like to hold of med changes right now, see how he does once retired.    2. Secondary hypercoagulable state  3. LE edema, skin changes Varicose veins noted He is on his feet a lot at work Has tried support stockings but poorly fit Offered vascular referral, he will follow up with his PMD   Disposition: back in 3 mo, sooner if needed    Current medicines are reviewed at length with the patient today.  The patient did not have any concerns regarding medicines.  Norma Fredrickson, PA-C 03/28/2023 12:58 PM     [facility] [address] [place] [address] [phone] (office)  [phone] (fax)

## 2023-03-29 ENCOUNTER — Encounter: Payer: Self-pay | Admitting: Physician Assistant

## 2023-03-29 ENCOUNTER — Ambulatory Visit: Payer: Medicare Other | Attending: Physician Assistant | Admitting: Physician Assistant

## 2023-03-29 VITALS — BP 126/70 | HR 60 | Ht 74.0 in | Wt 272.0 lb

## 2023-03-29 DIAGNOSIS — D6869 Other thrombophilia: Secondary | ICD-10-CM

## 2023-03-29 DIAGNOSIS — I484 Atypical atrial flutter: Secondary | ICD-10-CM | POA: Diagnosis not present

## 2023-03-29 DIAGNOSIS — R609 Edema, unspecified: Secondary | ICD-10-CM | POA: Diagnosis not present

## 2023-03-29 NOTE — Patient Instructions (Signed)
Medication Instructions:    Your physician recommends that you continue on your current medications as directed. Please refer to the Current Medication list given to you today.   *If you need a refill on your cardiac medications before your next appointment, please call your pharmacy*   Lab Work: NONE ORDERED  TODAY   If you have labs (blood work) drawn today and your tests are completely normal, you will receive your results only by: MyChart Message (if you have MyChart) OR A paper copy in the mail If you have any lab test that is abnormal or we need to change your treatment, we will call you to review the results.   Testing/Procedures: NONE ORDERED  TODAY    Follow-Up: At [facility], you and your health needs are our priority.  As part of our continuing mission to provide you with exceptional heart care, we have created designated Provider Care Teams.  These Care Teams include your primary Cardiologist (physician) and Advanced Practice Providers (APPs -  Physician Assistants and Nurse Practitioners) who all work together to provide you with the care you need, when you need it.  We recommend signing up for the patient portal called "MyChart".  Sign up information is provided on this After Visit Summary.  MyChart is used to connect with patients for Virtual Visits (Telemedicine).  Patients are able to view lab/test results, encounter notes, upcoming appointments, etc.  Non-urgent messages can be sent to your provider as well.   To learn more about what you can do with MyChart, go to [email or web address].    Your next appointment:    3 month(s) ( PLEASE CONTACT EP [facility] FOR ANY SCHEDULE  COMPLICATIONS)    Provider:    Sherryl Manges, MD or Francis Dowse, PA-C    Other Instructions

## 2023-04-05 NOTE — ED Notes (Signed)
Late Entry: Pt was respirations stable during sedation and throughout ED visit.

## 2023-04-12 DIAGNOSIS — G4733 Obstructive sleep apnea (adult) (pediatric): Secondary | ICD-10-CM | POA: Diagnosis not present

## 2023-04-16 ENCOUNTER — Ambulatory Visit (INDEPENDENT_AMBULATORY_CARE_PROVIDER_SITE_OTHER): Payer: Medicare Other

## 2023-04-16 DIAGNOSIS — Z0000 Encounter for general adult medical examination without abnormal findings: Secondary | ICD-10-CM | POA: Diagnosis not present

## 2023-04-16 NOTE — Patient Instructions (Signed)
Richard Vazquez , Thank you for taking time to come for your Medicare Wellness Visit. I appreciate your ongoing commitment to your health goals. Please review the following plan we discussed and let me know if I can assist you in the future.   Referrals/Orders/Follow-Ups/Clinician Recommendations: none  This is a list of the screening recommended for you and due dates:  Health Maintenance  Topic Date Due   Colon Cancer Screening  Never done   Flu Shot  03/29/2023   Pneumonia Vaccine (1 of 1 - PCV) 09/28/2023*   COVID-19 Vaccine (1) 09/28/2023*   Hepatitis C Screening  09/28/2023*   Zoster (Shingles) Vaccine (1 of 2) 09/28/2023*   Screening for Lung Cancer  01/18/2024   Medicare Annual Wellness Visit  04/15/2024   DTaP/Tdap/Td vaccine (2 - Td or Tdap) 02/06/2030   HPV Vaccine  Aged Out  *Topic was postponed. The date shown is not the original due date.    Advanced directives: (Copy Requested) Please bring a copy of your health care power of attorney and living will to the office to be added to your chart at your convenience.  Next Medicare Annual Wellness Visit scheduled for next year: Yes  Preventive Care 73 Years and Older, Male  Preventive care refers to lifestyle choices and visits with your health care provider that can promote health and wellness. What does preventive care include? A yearly physical exam. This is also called an annual well check. Dental exams once or twice a year. Routine eye exams. Ask your health care provider how often you should have your eyes checked. Personal lifestyle choices, including: Daily care of your teeth and gums. Regular physical activity. Eating a healthy diet. Avoiding tobacco and drug use. Limiting alcohol use. Practicing safe sex. Taking low doses of aspirin every day. Taking vitamin and mineral supplements as recommended by your health care provider. What happens during an annual well check? The services and screenings done by your health  care provider during your annual well check will depend on your age, overall health, lifestyle risk factors, and family history of disease. Counseling  Your health care provider may ask you questions about your: Alcohol use. Tobacco use. Drug use. Emotional well-being. Home and relationship well-being. Sexual activity. Eating habits. History of falls. Memory and ability to understand (cognition). Work and work Astronomer. Screening  You may have the following tests or measurements: Height, weight, and BMI. Blood pressure. Lipid and cholesterol levels. These may be checked every 5 years, or more frequently if you are over 21 years old. Skin check. Lung cancer screening. You may have this screening every year starting at age 34 if you have a 30-pack-year history of smoking and currently smoke or have quit within the past 15 years. Fecal occult blood test (FOBT) of the stool. You may have this test every year starting at age 14. Flexible sigmoidoscopy or colonoscopy. You may have a sigmoidoscopy every 5 years or a colonoscopy every 10 years starting at age 20. Prostate cancer screening. Recommendations will vary depending on your family history and other risks. Hepatitis C blood test. Hepatitis B blood test. Sexually transmitted disease (STD) testing. Diabetes screening. This is done by checking your blood sugar (glucose) after you have not eaten for a while (fasting). You may have this done every 1-3 years. Abdominal aortic aneurysm (AAA) screening. You may need this if you are a current or former smoker. Osteoporosis. You may be screened starting at age 61 if you are at high risk.  Talk with your health care provider about your test results, treatment options, and if necessary, the need for more tests. Vaccines  Your health care provider may recommend certain vaccines, such as: Influenza vaccine. This is recommended every year. Tetanus, diphtheria, and acellular pertussis (Tdap, Td)  vaccine. You may need a Td booster every 10 years. Zoster vaccine. You may need this after age 76. Pneumococcal 13-valent conjugate (PCV13) vaccine. One dose is recommended after age 62. Pneumococcal polysaccharide (PPSV23) vaccine. One dose is recommended after age 68. Talk to your health care provider about which screenings and vaccines you need and how often you need them. This information is not intended to replace advice given to you by your health care provider. Make sure you discuss any questions you have with your health care provider. Document Released: 09/10/2015 Document Revised: 05/03/2016 Document Reviewed: 06/15/2015 Elsevier Interactive Patient Education  2017 [employer].  Fall Prevention in the Home Falls can cause injuries. They can happen to people of all ages. There are many things you can do to make your home safe and to help prevent falls. What can I do on the outside of my home? Regularly fix the edges of walkways and driveways and fix any cracks. Remove anything that might make you trip as you walk through a door, such as a raised step or threshold. Trim any bushes or trees on the path to your home. Use bright outdoor lighting. Clear any walking paths of anything that might make someone trip, such as rocks or tools. Regularly check to see if handrails are loose or broken. Make sure that both sides of any steps have handrails. Any raised decks and porches should have guardrails on the edges. Have any leaves, snow, or ice cleared regularly. Use sand or salt on walking paths during winter. Clean up any spills in your garage right away. This includes oil or grease spills. What can I do in the bathroom? Use night lights. Install grab bars by the toilet and in the tub and shower. Do not use towel bars as grab bars. Use non-skid mats or decals in the tub or shower. If you need to sit down in the shower, use a plastic, non-slip stool. Keep the floor dry. Clean up any  water that spills on the floor as soon as it happens. Remove soap buildup in the tub or shower regularly. Attach bath mats securely with double-sided non-slip rug tape. Do not have throw rugs and other things on the floor that can make you trip. What can I do in the bedroom? Use night lights. Make sure that you have a light by your bed that is easy to reach. Do not use any sheets or blankets that are too big for your bed. They should not hang down onto the floor. Have a firm chair that has side arms. You can use this for support while you get dressed. Do not have throw rugs and other things on the floor that can make you trip. What can I do in the kitchen? Clean up any spills right away. Avoid walking on wet floors. Keep items that you use a lot in easy-to-reach places. If you need to reach something above you, use a strong step stool that has a grab bar. Keep electrical cords out of the way. Do not use floor polish or wax that makes floors slippery. If you must use wax, use non-skid floor wax. Do not have throw rugs and other things on the floor that can make  you trip. What can I do with my stairs? Do not leave any items on the stairs. Make sure that there are handrails on both sides of the stairs and use them. Fix handrails that are broken or loose. Make sure that handrails are as long as the stairways. Check any carpeting to make sure that it is firmly attached to the stairs. Fix any carpet that is loose or worn. Avoid having throw rugs at the top or bottom of the stairs. If you do have throw rugs, attach them to the floor with carpet tape. Make sure that you have a light switch at the top of the stairs and the bottom of the stairs. If you do not have them, ask someone to add them for you. What else can I do to help prevent falls? Wear shoes that: Do not have high heels. Have rubber bottoms. Are comfortable and fit you well. Are closed at the toe. Do not wear sandals. If you use a  stepladder: Make sure that it is fully opened. Do not climb a closed stepladder. Make sure that both sides of the stepladder are locked into place. Ask someone to hold it for you, if possible. Clearly [place] and make sure that you can see: Any grab bars or handrails. First and last steps. Where the edge of each step is. Use tools that help you move around (mobility aids) if they are needed. These include: Canes. Walkers. Scooters. Crutches. Turn on the lights when you go into a dark area. Replace any light bulbs as soon as they burn out. Set up your furniture so you have a clear path. Avoid moving your furniture around. If any of your floors are uneven, fix them. If there are any pets around you, be aware of where they are. Review your medicines with your doctor. Some medicines can make you feel dizzy. This can increase your chance of falling. Ask your doctor what other things that you can do to help prevent falls. This information is not intended to replace advice given to you by your health care provider. Make sure you discuss any questions you have with your health care provider. Document Released: 06/10/2009 Document Revised: 01/20/2016 Document Reviewed: 09/18/2014 Elsevier Interactive Patient Education  2017 [employer].

## 2023-04-16 NOTE — Progress Notes (Signed)
Subjective:   Richard Vazquez is a 67 y.o. male who presents for Medicare Annual/Subsequent preventive examination.  Visit Complete: Virtual  I connected with  Richard Vazquez on 04/16/23 by a audio enabled telemedicine application and verified that I am speaking with the correct person using two identifiers.  Patient Location: Home  Provider Location: Office/Clinic  I discussed the limitations of evaluation and management by telemedicine. The patient expressed understanding and agreed to proceed.  Patient Medicare AWV questionnaire was completed by the patient on 04/14/2023; I have confirmed that all information answered by patient is correct and no changes since this date.  Vital Signs: Unable to obtain new vitals due to this being a telehealth visit.  Review of Systems     Cardiac Risk Factors include: advanced age (>57men, >4 women);dyslipidemia;male gender     Objective:    Today's Vitals   There is no height or weight on file to calculate BMI.     04/16/2023    8:16 AM 03/19/2023    5:18 PM 04/11/2022    2:17 PM 01/16/2022    6:09 AM 12/05/2021   10:48 PM 12/05/2021    3:24 PM 10/05/2021   10:54 AM  Advanced Directives  Does Patient Have a Medical Advance Directive? Yes Yes No Yes;No No No No  Type of Diplomatic Services operational officer;Living will        Copy of Healthcare Power of Attorney in Chart? No - copy requested        Would patient like information on creating a medical advance directive?   No - Patient declined Yes (MAU/Ambulatory/Procedural Areas - Information given) No - Patient declined  No - Patient declined    Current Medications (verified) Outpatient Encounter Medications as of 04/16/2023  Medication Sig   apixaban (ELIQUIS) 5 MG TABS tablet Take 1 tablet (5 mg total) by mouth 2 (two) times daily.   b complex vitamins capsule Take 1 capsule by mouth daily.   bisacodyl (DULCOLAX) 5 MG EC tablet Take 5 mg by mouth as needed.   cholecalciferol (VITAMIN  D3) 25 MCG (1000 UNIT) tablet Take 1,000 Units by mouth daily.   diclofenac Sodium (VOLTAREN) 1 % GEL Apply 4 g topically 4 (four) times daily as needed.   diltiazem (CARDIZEM CD) 180 MG 24 hr capsule Take 1 capsule (180 mg total) by mouth daily.   indapamide (LOZOL) 2.5 MG tablet Take 2.5 mg by mouth daily.   Iron, Ferrous Sulfate, 325 (65 Fe) MG TABS Take 325 mg by mouth every other day.   Multiple Vitamin (MULTIVITAMIN) tablet Take 1 tablet by mouth daily.   nitroGLYCERIN (NITROSTAT) 0.4 MG SL tablet Place 1 tablet (0.4 mg total) under the tongue every 5 (five) minutes as needed for chest pain (Max 3 doses).   Omega-3 1000 MG CAPS Take 1,000 mg by mouth daily.   polyethylene glycol (MIRALAX / GLYCOLAX) 17 g packet Take 17 g by mouth daily.   tamsulosin (FLOMAX) 0.4 MG CAPS capsule Take 1 capsule by mouth daily.   triamcinolone cream (KENALOG) 0.1 % Apply 1 Application topically 2 (two) times daily.   rosuvastatin (CRESTOR) 20 MG tablet Take 1 tablet (20 mg total) by mouth daily.   No facility-administered encounter medications on file as of 04/16/2023.    Allergies (verified) Lipitor [atorvastatin], Codeine, and Latex   History: Past Medical History:  Diagnosis Date   Acute pain of right knee 05/16/2022   Carotid artery disease (HCC)    Carotid  US 09/01/2022: R 40-59, L 1-39   Former smoker 12/21/2015   Hyperlipemia    Kidney stones    Left carotid bruit 08/05/2021   Obesity    Paroxysmal atrial flutter (HCC) 05/29/2021   Past Surgical History:  Procedure Laterality Date   A-FLUTTER ABLATION N/A 01/16/2022   Procedure: A-FLUTTER ABLATION;  Surgeon: Duke Salvia, MD;  Location[facility] INVASIVE CV LAB;  Service: Cardiovascular;  Laterality: N/A;   APPENDECTOMY     CARDIOVERSION N/A 05/31/2021   Procedure: CARDIOVERSION;  Surgeon: Chrystie Nose, MD;  Location: [facility] ENDOSCOPY;  Service: Cardiovascular;  Laterality: N/A;   CARDIOVERSION N/A 10/05/2021   Procedure: CARDIOVERSION;   Surgeon: Chrystie Nose, MD;  Location: [facility] ENDOSCOPY;  Service: Cardiovascular;  Laterality: N/A;   LEFT HEART CATH AND CORONARY ANGIOGRAPHY N/A 12/07/2021   Procedure: LEFT HEART CATH AND CORONARY ANGIOGRAPHY;  Surgeon: [name], MD;  Location: [facility] INVASIVE CV LAB;  Service: Cardiovascular;  Laterality: N/A;   TEE WITHOUT CARDIOVERSION N/A 05/31/2021   Procedure: TRANSESOPHAGEAL ECHOCARDIOGRAM (TEE);  Surgeon: Chrystie Nose, MD;  Location: [facility] ENDOSCOPY;  Service: Cardiovascular;  Laterality: N/A;   History reviewed. No pertinent family history. Social History   Socioeconomic History   Marital status: Married    Spouse name: Not on file   Number of children: Not on file   Years of education: Not on file   Highest education level: 12th grade  Occupational History   Not on file  Tobacco Use   Smoking status: Former    Current packs/day: 0.00    Average packs/day: 1.5 packs/day for 40.0 years (60.0 ttl pk-yrs)    Types: Cigarettes    Start date: 12/09/1979    Quit date: 12/09/2019    Years since quitting: 3.3    Passive exposure: Past   Smokeless tobacco: Never  Vaping Use   Vaping status: Never Used  Substance and Sexual Activity   Alcohol use: Not Currently   Drug use: Never   Sexual activity: Not Currently  Other Topics Concern   Not on file  Social History Narrative   Not on file   Social Determinants of Health   Financial Resource Strain: Low Risk  (04/14/2023)   Overall Financial Resource Strain (CARDIA)    Difficulty of Paying Living Expenses: Not hard at all  Food Insecurity: No Food Insecurity (04/14/2023)   Hunger Vital Sign    Worried About Running Out of Food in the Last Year: Never true    Ran Out of Food in the Last Year: Never true  Transportation Needs: No Transportation Needs (04/14/2023)   PRAPARE - Administrator, Civil Service (Medical): No    Lack of Transportation (Non-Medical): No  Physical Activity: Insufficiently Active  (04/14/2023)   Exercise Vital Sign    Days of Exercise per Week: 4 days    Minutes of Exercise per Session: 10 min  Stress: No Stress Concern Present (04/14/2023)   [employer] of Occupational Health - Occupational Stress Questionnaire    Feeling of Stress : Only a little  Social Connections: Unknown (04/14/2023)   Social Connection and Isolation Panel [NHANES]    Frequency of Communication with Friends and Family: Patient declined    Frequency of Social Gatherings with Friends and Family: Patient declined    Attends Religious Services: Never    Database administrator or Organizations: No    Attends Engineer, structural: Patient declined    Marital Status: Married  Tobacco Counseling Counseling given: Not Answered   Clinical Intake:  Pre-visit preparation completed: Yes  Pain : No/denies pain     Nutritional Risks: None Diabetes: No  How often do you need to have someone help you when you read instructions, pamphlets, or other written materials from your doctor or pharmacy?: 1 - Never  Interpreter Needed?: No  Information entered by :: NAllen LPN   Activities of Daily Living    04/14/2023    3:30 AM  In your present state of health, do you have any difficulty performing the following activities:  Hearing? 0  Vision? 0  Difficulty concentrating or making decisions? 0  Walking or climbing stairs? 0  Dressing or bathing? 0  Doing errands, shopping? 0  Preparing Food and eating ? N  Using the Toilet? N  In the past six months, have you accidently leaked urine? N  Do you have problems with loss of bowel control? N  Managing your Medications? N  Managing your Finances? N  Housekeeping or managing your Housekeeping? N    Patient Care Team: Garnette Gunner, MD as PCP - General (Family Medicine) Pricilla Riffle, MD as PCP - Cardiology (Cardiology) Kennon Rounds as Physician Assistant (Cardiology)  Indicate any recent Medical Services you may  have received from other than [facility] providers in the past year (date may be approximate).     Assessment:   This is a routine wellness examination for [employer].  Hearing/Vision screen Hearing Screening - Comments:: Denies hearing issues Vision Screening - Comments:: Regular eye exams, [facility]  Dietary issues and exercise activities discussed:     Goals Addressed             This Visit's Progress    Patient Stated       04/16/2023, wants to lose weight       Depression Screen    04/16/2023    8:18 AM 03/28/2023    2:13 PM 12/18/2022    1:46 PM 04/11/2022    2:18 PM 04/11/2022    2:15 PM 12/14/2021    1:11 PM  PHQ 2/9 Scores  PHQ - 2 Score 0 0 0 0 0 0  PHQ- 9 Score 1  3       Fall Risk    04/14/2023    3:30 AM 03/28/2023    2:09 PM 12/18/2022    1:09 PM 05/16/2022    3:14 PM 04/11/2022    2:18 PM  Fall Risk   Falls in the past year? 0 1 0 1 0  Number falls in past yr: 0 0 0 0 0  Injury with Fall? 0 1 0 1 0  Risk for fall due to : Medication side effect No Fall Risks     Follow up Falls prevention discussed;Falls evaluation completed Falls prevention discussed   Falls evaluation completed;Education provided    MEDICARE RISK AT HOME: Medicare Risk at Home Any stairs in or around the home?: Yes If so, are there any without handrails?: No Home free of loose throw rugs in walkways, pet beds, electrical cords, etc?: No Adequate lighting in your home to reduce risk of falls?: Yes Life alert?: No Use of a cane, walker or w/c?: No Grab bars in the bathroom?: No Shower chair or bench in shower?: Yes Elevated toilet seat or a handicapped toilet?: No  TIMED UP AND GO:  Was the test performed?  No    Cognitive Function:  04/16/2023    8:19 AM 04/11/2022    2:25 PM  6CIT Screen  What Year? 0 points 0 points  What month? 0 points 0 points  What time? 0 points 0 points  Count back from 20 0 points 0 points  Months in reverse 0 points 0 points  Repeat phrase 0  points 0 points  Total Score 0 points 0 points    Immunizations Immunization History  Administered Date(s) Administered   Fluad Quad(high Dose 65+) 05/16/2022   Tdap 02/07/2020    TDAP status: Up to date  Flu Vaccine status: Due, Education has been provided regarding the importance of this vaccine. Advised may receive this vaccine at local pharmacy or Health Dept. Aware to provide a copy of the vaccination record if obtained from local pharmacy or Health Dept. Verbalized acceptance and understanding.  Pneumococcal vaccine status: Due, Education has been provided regarding the importance of this vaccine. Advised may receive this vaccine at local pharmacy or Health Dept. Aware to provide a copy of the vaccination record if obtained from local pharmacy or Health Dept. Verbalized acceptance and understanding.  Covid-19 vaccine status: Declined, Education has been provided regarding the importance of this vaccine but patient still declined. Advised may receive this vaccine at local pharmacy or Health Dept.or vaccine clinic. Aware to provide a copy of the vaccination record if obtained from local pharmacy or Health Dept. Verbalized acceptance and understanding.  Qualifies for Shingles Vaccine? Yes   Zostavax completed No   Shingrix Completed?: No.    Education has been provided regarding the importance of this vaccine. Patient has been advised to call insurance company to determine out of pocket expense if they have not yet received this vaccine. Advised may also receive vaccine at local pharmacy or Health Dept. Verbalized acceptance and understanding.  Screening Tests Health Maintenance  Topic Date Due   Colonoscopy  Never done   INFLUENZA VACCINE  03/29/2023   Pneumonia Vaccine 54+ Years old (1 of 1 - PCV) 09/28/2023 (Originally 12/08/2020)   COVID-19 Vaccine (1) 09/28/2023 (Originally 12/08/1960)   Hepatitis C Screening  09/28/2023 (Originally 12/08/1973)   Zoster Vaccines- Shingrix (1 of 2)  09/28/2023 (Originally 12/09/1974)   Lung Cancer Screening  01/18/2024   Medicare Annual Wellness (AWV)  04/15/2024   DTaP/Tdap/Td (2 - Td or Tdap) 02/06/2030   HPV VACCINES  Aged Out    Health Maintenance  Health Maintenance Due  Topic Date Due   Colonoscopy  Never done   INFLUENZA VACCINE  03/29/2023    Colorectal cancer screening: has appointment 05/15/2023 for consultation  Lung Cancer Screening: (Low Dose CT Chest recommended if Age 54-80 years, 20 pack-year currently smoking OR have quit w/in 15years.) does qualify.   Lung Cancer Screening Referral: CT scan 01/18/2023  Additional Screening:  Hepatitis C Screening: does not qualify;   Vision Screening: Recommended annual ophthalmology exams for early detection of glaucoma and other disorders of the eye. Is the patient up to date with their annual eye exam?  Yes  Who is the provider or what is the name of the office in which the patient attends annual eye exams? [facility] If pt is not established with a provider, would they like to be referred to a provider to establish care? No .   Dental Screening: Recommended annual dental exams for proper oral hygiene  Diabetic Foot Exam: n/a  Community Resource Referral / Chronic Care Management: CRR required this visit?  No   CCM required this visit?  No  Plan:     I have personally reviewed and noted the following in the patient's chart:   Medical and social history Use of alcohol, tobacco or illicit drugs  Current medications and supplements including opioid prescriptions. Patient is not currently taking opioid prescriptions. Functional ability and status Nutritional status Physical activity Advanced directives List of other physicians Hospitalizations, surgeries, and ER visits in previous 12 months Vitals Screenings to include cognitive, depression, and falls Referrals and appointments  In addition, I have reviewed and discussed with patient certain preventive  protocols, quality metrics, and best practice recommendations. A written personalized care plan for preventive services as well as general preventive health recommendations were provided to patient.     Barb Merino, LPN   [id number]/[id number]   After Visit Summary: (MyChart) Due to this being a telephonic visit, the after visit summary with patients personalized plan was offered to patient via MyChart   Nurse Notes: none

## 2023-04-23 ENCOUNTER — Telehealth: Payer: Self-pay | Admitting: Internal Medicine

## 2023-04-23 NOTE — Telephone Encounter (Signed)
Pt c/o medication issue:  1. Name of Medication: Mucinex     2. How are you currently taking this medication (dosage and times per day)?   3. Are you having a reaction (difficulty breathing--STAT)?   4. What is your medication issue? Patient is requesting call back to make sure this medication will be okay to take with his current meds. Please advise.

## 2023-04-23 NOTE — Telephone Encounter (Signed)
Left a message for the pt to call back and that I sent him a MY Chart message to call back if has any further questions.

## 2023-04-23 NOTE — Telephone Encounter (Signed)
OK to take Mucinex 

## 2023-05-02 ENCOUNTER — Ambulatory Visit: Payer: Medicare Other | Admitting: Internal Medicine

## 2023-05-13 DIAGNOSIS — G4733 Obstructive sleep apnea (adult) (pediatric): Secondary | ICD-10-CM | POA: Diagnosis not present

## 2023-05-15 ENCOUNTER — Encounter: Payer: Self-pay | Admitting: Physician Assistant

## 2023-05-15 ENCOUNTER — Telehealth: Payer: Self-pay

## 2023-05-15 ENCOUNTER — Ambulatory Visit: Payer: Medicare Other | Admitting: Physician Assistant

## 2023-05-15 ENCOUNTER — Telehealth: Payer: Self-pay | Admitting: Physician Assistant

## 2023-05-15 VITALS — BP 118/60 | HR 68 | Ht 73.25 in | Wt 282.2 lb

## 2023-05-15 DIAGNOSIS — K625 Hemorrhage of anus and rectum: Secondary | ICD-10-CM | POA: Diagnosis not present

## 2023-05-15 DIAGNOSIS — Z1211 Encounter for screening for malignant neoplasm of colon: Secondary | ICD-10-CM | POA: Diagnosis not present

## 2023-05-15 DIAGNOSIS — Z7901 Long term (current) use of anticoagulants: Secondary | ICD-10-CM

## 2023-05-15 DIAGNOSIS — I4892 Unspecified atrial flutter: Secondary | ICD-10-CM

## 2023-05-15 MED ORDER — PLENVU 140 G PO SOLR: 1.0000 | ORAL | 0 refills | Status: DC

## 2023-05-15 NOTE — Progress Notes (Signed)
Chief Complaint: Rectal bleeding  HPI:    Richard Vazquez is a 67 year old male with a past medical history as listed below including A flutter on Eliquis (12/07/2021 cardiac cath with mild obstructive coronary artery disease,  12/06/21 echo with LVEF 65-70%), who was referred to me by Garnette Gunner, MD for a complaint of rectal bleeding.      09/18/2022 CBC normal.  CMP normal.    Today, the patient describes that he has never had a colonoscopy and that he is overdue for one.  He was a little bit constipated for a while but now uses occasional MiraLAX and Dulcolax and does not have to deal with this anymore.  Also describes occasional episodes of some bright red blood that he would pass from his rectum typically shortly after having a bowel movement.  This was typically only one episode and then may go away for a few months and then come back, he always attributed it to hemorrhoids.  He has not had an episode in the past 2 to 3 months.    Recently retired a month ago.    Denies fever, chills, weight loss or symptoms that awaken him from sleep.  Past Medical History:  Diagnosis Date   Acute pain of right knee 05/16/2022   Carotid artery disease (HCC)    Carotid US 09/01/2022: R 40-59, L 1-39   Former smoker 12/21/2015   Hyperlipemia    Kidney stones    Left carotid bruit 08/05/2021   Obesity    Paroxysmal atrial flutter (HCC) 05/29/2021    Past Surgical History:  Procedure Laterality Date   A-FLUTTER ABLATION N/A 01/16/2022   Procedure: A-FLUTTER ABLATION;  Surgeon: Duke Salvia, MD;  Location: [facility] INVASIVE CV LAB;  Service: Cardiovascular;  Laterality: N/A;   APPENDECTOMY     CARDIOVERSION N/A 05/31/2021   Procedure: CARDIOVERSION;  Surgeon: Chrystie Nose, MD;  Location: [facility] ENDOSCOPY;  Service: Cardiovascular;  Laterality: N/A;   CARDIOVERSION N/A 10/05/2021   Procedure: CARDIOVERSION;  Surgeon: Chrystie Nose, MD;  Location: [facility] ENDOSCOPY;  Service: Cardiovascular;  Laterality: N/A;    LEFT HEART CATH AND CORONARY ANGIOGRAPHY N/A 12/07/2021   Procedure: LEFT HEART CATH AND CORONARY ANGIOGRAPHY;  Surgeon: [name], MD;  Location: [facility] INVASIVE CV LAB;  Service: Cardiovascular;  Laterality: N/A;   TEE WITHOUT CARDIOVERSION N/A 05/31/2021   Procedure: TRANSESOPHAGEAL ECHOCARDIOGRAM (TEE);  Surgeon: Chrystie Nose, MD;  Location: [facility] ENDOSCOPY;  Service: Cardiovascular;  Laterality: N/A;    Current Outpatient Medications  Medication Sig Dispense Refill   apixaban (ELIQUIS) 5 MG TABS tablet Take 1 tablet (5 mg total) by mouth 2 (two) times daily. 180 tablet 3   b complex vitamins capsule Take 1 capsule by mouth daily.     bisacodyl (DULCOLAX) 5 MG EC tablet Take 5 mg by mouth as needed.     cholecalciferol (VITAMIN D3) 25 MCG (1000 UNIT) tablet Take 1,000 Units by mouth daily.     diclofenac Sodium (VOLTAREN) 1 % GEL Apply 4 g topically 4 (four) times daily as needed. 100 g 3   diltiazem (CARDIZEM CD) 180 MG 24 hr capsule Take 1 capsule (180 mg total) by mouth daily. 90 capsule 1   indapamide (LOZOL) 2.5 MG tablet Take 2.5 mg by mouth daily.     Iron, Ferrous Sulfate, 325 (65 Fe) MG TABS Take 325 mg by mouth every other day.     Multiple Vitamin (MULTIVITAMIN) tablet Take 1 tablet by mouth  daily.     nitroGLYCERIN (NITROSTAT) 0.4 MG SL tablet Place 1 tablet (0.4 mg total) under the tongue every 5 (five) minutes as needed for chest pain (Max 3 doses). 25 tablet 2   Omega-3 1000 MG CAPS Take 1,000 mg by mouth daily.     polyethylene glycol (MIRALAX / GLYCOLAX) 17 g packet Take 17 g by mouth daily.     rosuvastatin (CRESTOR) 20 MG tablet Take 1 tablet (20 mg total) by mouth daily. 90 tablet 3   tamsulosin (FLOMAX) 0.4 MG CAPS capsule Take 1 capsule by mouth daily.     triamcinolone cream (KENALOG) 0.1 % Apply 1 Application topically 2 (two) times daily. 30 g 0   No current facility-administered medications for this visit.    Allergies as of 05/15/2023 - Review Complete  04/16/2023  Allergen Reaction Noted   Lipitor [atorvastatin] Other (See Comments) 06/05/2014   Codeine Itching 05/29/2021   Latex Itching 05/29/2021    No family history on file.  Social History   Socioeconomic History   Marital status: Married    Spouse name: Not on file   Number of children: Not on file   Years of education: Not on file   Highest education level: 12th grade  Occupational History   Not on file  Tobacco Use   Smoking status: Former    Current packs/day: 0.00    Average packs/day: 1.5 packs/day for 40.0 years (60.0 ttl pk-yrs)    Types: Cigarettes    Start date: 12/09/1979    Quit date: 12/09/2019    Years since quitting: 3.4    Passive exposure: Past   Smokeless tobacco: Never  Vaping Use   Vaping status: Never Used  Substance and Sexual Activity   Alcohol use: Not Currently   Drug use: Never   Sexual activity: Not Currently  Other Topics Concern   Not on file  Social History Narrative   Not on file   Social Determinants of Health   Financial Resource Strain: Low Risk  (04/14/2023)   Overall Financial Resource Strain (CARDIA)    Difficulty of Paying Living Expenses: Not hard at all  Food Insecurity: No Food Insecurity (04/14/2023)   Hunger Vital Sign    Worried About Running Out of Food in the Last Year: Never true    Ran Out of Food in the Last Year: Never true  Transportation Needs: No Transportation Needs (04/14/2023)   PRAPARE - Administrator, Civil Service (Medical): No    Lack of Transportation (Non-Medical): No  Physical Activity: Insufficiently Active (04/14/2023)   Exercise Vital Sign    Days of Exercise per Week: 4 days    Minutes of Exercise per Session: 10 min  Stress: No Stress Concern Present (04/14/2023)   [employer] of Occupational Health - Occupational Stress Questionnaire    Feeling of Stress : Only a little  Social Connections: Unknown (04/14/2023)   Social Connection and Isolation Panel [NHANES]    Frequency  of Communication with Friends and Family: Patient declined    Frequency of Social Gatherings with Friends and Family: Patient declined    Attends Religious Services: Never    Database administrator or Organizations: No    Attends Banker Meetings: Patient declined    Marital Status: Married  Catering manager Violence: Not At Risk (04/16/2023)   Humiliation, Afraid, Rape, and Kick questionnaire    Fear of Current or Ex-Partner: No    Emotionally Abused: No  Physically Abused: No    Sexually Abused: No    Review of Systems:    Constitutional: No weight loss, fever or chills Skin: No rash  Cardiovascular: No chest pain Respiratory: No SOB  Gastrointestinal: See HPI and otherwise negative Genitourinary: No dysuria Neurological: No headache, dizziness or syncope Musculoskeletal: No new muscle or joint pain Hematologic: No bruising Psychiatric: No history of depression or anxiety   Physical Exam:  Vital signs: BP 118/60 (BP Location: Left Arm, Patient Position: Sitting, Cuff Size: Large)   Pulse 68   Ht 6' 1.25" (1.861 m) Comment: height measured without shoes  Wt 282 lb 4 oz (128 kg)   BMI 36.98 kg/m    Constitutional:   Pleasant overweight Caucasian male appears to be in NAD, Well developed, Well nourished, alert and cooperative Head:  Normocephalic and atraumatic. Eyes:   PEERL, EOMI. No icterus. Conjunctiva pink. Ears:  Normal auditory acuity. Neck:  Supple Throat: Oral cavity and pharynx without inflammation, swelling or lesion.  Respiratory: Respirations even and unlabored. Lungs clear to auscultation bilaterally.   No wheezes, crackles, or rhonchi.  Cardiovascular: Normal S1, S2. No MRG. Regular rate and rhythm. No peripheral edema, cyanosis or pallor.  Gastrointestinal:  Soft, nondistended, nontender. No rebound or guarding. Normal bowel sounds. No appreciable masses or hepatomegaly. Rectal:  Not performed.  Msk:  Symmetrical without gross deformities.  Without edema, no deformity or joint abnormality.  Neurologic:  Alert and  oriented x4;  grossly normal neurologically.  Skin:   Dry and intact without significant lesions or rashes. Psychiatric: Demonstrates good judgement and reason without abnormal affect or behaviors.  RELEVANT LABS AND IMAGING: CBC    Component Value Date/Time   WBC 8.9 03/19/2023 1556   RBC 5.07 03/19/2023 1556   HGB 15.0 03/19/2023 1556   HGB 13.7 02/16/2023 1033   HCT 44.7 03/19/2023 1556   HCT 41.1 02/16/2023 1033   PLT 206 03/19/2023 1556   PLT 215 02/16/2023 1033   MCV 88.2 03/19/2023 1556   MCV 88 02/16/2023 1033   MCH 29.6 03/19/2023 1556   MCHC 33.6 03/19/2023 1556   RDW 14.3 03/19/2023 1556   RDW 14.5 02/16/2023 1033   LYMPHSABS 1.0 03/19/2023 1556   MONOABS 0.8 03/19/2023 1556   EOSABS 0.6 (H) 03/19/2023 1556   BASOSABS 0.1 03/19/2023 1556    CMP     Component Value Date/Time   NA 137 03/19/2023 1556   NA 144 02/16/2023 1033   K 3.9 03/19/2023 1556   CL 103 03/19/2023 1556   CO2 24 03/19/2023 1556   GLUCOSE 127 (H) 03/19/2023 1556   BUN 18 03/19/2023 1556   BUN 24 02/16/2023 1033   CREATININE 0.98 03/19/2023 1556   CALCIUM 8.8 (L) 03/19/2023 1556   PROT 7.3 03/19/2023 1556   PROT 6.4 08/05/2021 0924   ALBUMIN 4.0 03/19/2023 1556   ALBUMIN 4.3 08/05/2021 0924   AST 20 03/19/2023 1556   ALT 20 03/19/2023 1556   ALKPHOS 70 03/19/2023 1556   BILITOT 0.9 03/19/2023 1556   BILITOT 0.4 08/05/2021 0924   GFRNONAA >60 03/19/2023 1556    Assessment: 1.  Rectal bleeding: Isolated episodes of bright red blood per rectum after a bowel movement in the setting of constipation; most ikely hemorrhoids 2.  Screening for colorectal cancer: Patient is 8 and never had screening for colon cancer 3.  A-flutter: On chronic Eliquis  Plan: 1.  Scheduled patient for a diagnostic colonoscopy given recent episodes of rectal bleeding.  This  is scheduled with Dr. Rhea Belton in the [facility].  Did provide the  patient in detailed list of risks for the procedure and he agrees to proceed. Patient is appropriate for endoscopic procedure(s) in the ambulatory ([facility]) setting.  2.  Patient will discontinue Eliquis 2 days prior to time of procedure.  We will communicate with his prescribing physician to ensure this is acceptable for him. 3.  Patient to continue MiraLAX and Dulcolax as needed 4.  Patient to follow in clinic per recommendations after time of colonoscopy  Hyacinth Meeker, PA-C [facility] 05/15/2023, 1:37 PM  Cc: Garnette Gunner, MD

## 2023-05-15 NOTE — Patient Instructions (Addendum)
_______________________________________________________  If your blood pressure at your visit was 140/90 or greater, please contact your primary care physician to follow up on this. _______________________________________________________  If you are age 67 or older, your body mass index should be between 23-30. Your Body mass index is 36.98 kg/m. If this is out of the aforementioned range listed, please consider follow up with your Primary Care Provider. _______________________________________________________  The [facility] providers would like to encourage you to use [facility] to communicate with providers for non-urgent requests or questions.  Due to long hold times on the telephone, sending your provider a message by [facility] may be a faster and more efficient way to get a response.  Please allow 48 business hours for a response.  Please remember that this is for non-urgent requests.  _______________________________________________________  Bonita Quin have been scheduled for a colonoscopy. Please follow written instructions given to you at your visit today.   Please pick up your prep supplies at the pharmacy within the next 1-3 days.  If you use inhalers (even only as needed), please bring them with you on the day of your procedure.  DO NOT TAKE 7 DAYS PRIOR TO TEST- Trulicity (dulaglutide) Ozempic, Wegovy (semaglutide) Mounjaro (tirzepatide) Bydureon Bcise (exanatide extended release)  DO NOT TAKE 1 DAY PRIOR TO YOUR TEST Rybelsus (semaglutide) Adlyxin (lixisenatide) Victoza (liraglutide) Byetta (exanatide) ___________________________________________________________________________  Due to recent changes in healthcare laws, you may see the results of your imaging and laboratory studies on MyChart before your provider has had a chance to review them.  We understand that in some cases there may be results that are confusing or concerning to you. Not all laboratory results come back in the  same time frame and the provider may be waiting for multiple results in order to interpret others.  Please give [place] 48 hours in order for your provider to thoroughly review all the results before contacting the office for clarification of your results.   Thank you for entrusting me with your care and choosing [facility].  Hyacinth Meeker, PA-C

## 2023-05-15 NOTE — Progress Notes (Signed)
Addendum: Reviewed and agree with assessment and management plan. Kadijah Shamoon M, MD  

## 2023-05-15 NOTE — Telephone Encounter (Signed)
Patient with diagnosis of afib on Eliquis for anticoagulation.    Procedure: colonoscopy Date of procedure: 05/25/23  CHA2DS2-VASc Score = 3  This indicates a 3.2% annual risk of stroke. The patient's score is based upon: CHF History: 0 HTN History: 1 Diabetes History: 0 Stroke History: 0 Vascular Disease History: 1 Age Score: 1 Gender Score: 0  Underwent DCCV in ED on 03/19/23.  CrCl >161mL/min Platelet count 206K  Per office protocol, patient can hold Eliquis for 2 days prior to procedure as requested.    **This guidance is not considered finalized until pre-operative APP has relayed final recommendations.**

## 2023-05-15 NOTE — Telephone Encounter (Signed)
[facility] Pre-operative Risk Assessment     Request for surgical clearance:     Endoscopy Procedure  What type of surgery is being performed?     Colonoscopy  When is this surgery scheduled?     05-25-23  What type of clearance is required ?   Pharmacy  Are there any medications that need to be held prior to surgery and how long? Eliquis x 2 days  Practice name and name of physician performing surgery?      [facility]  What is your office phone and fax number?      Phone- [phone]  Fax- [phone]  Anesthesia type (None, local, MAC, general) ?       MAC

## 2023-05-15 NOTE — Telephone Encounter (Signed)
Inbound call from patient stating prep medication that was called into his pharmacy today 9/17 is not covered by insurance. Patient requesting an alternative medication. Patient also requesting a call back to discuss. Please advise, thank you.

## 2023-05-15 NOTE — Telephone Encounter (Signed)
Patient stated that Plenvu is not covered by his insurance.  Patient advised we would use Miralax prep and instructions would be sent by MyChart. Patient agreed to plan and verbalized understanding.  No further questions.

## 2023-05-15 NOTE — Addendum Note (Signed)
Addended by: Lamona Curl on: 05/15/2023 04:21 PM   Modules accepted: Orders

## 2023-05-15 NOTE — Telephone Encounter (Signed)
Patient Name: Richard Vazquez  DOB: 04-26-1956 MRN: [id number]  Primary Cardiologist: Dietrich Pates, MD  Chart reviewed as part of pre-operative protocol coverage. Pre-op clearance already addressed by colleagues in earlier phone notes. To summarize recommendations:  -Per office protocol, patient can hold Eliquis for 2 days prior to procedure as requested. Please resume when medically safe to do so.  No medical clearance was requested.     Will route this bundled recommendation to requesting provider via Epic fax function and remove from pre-op pool. Please call with questions.  Sharlene Dory, PA-C 05/15/2023, 4:26 PM

## 2023-05-16 ENCOUNTER — Encounter: Payer: Self-pay | Admitting: Internal Medicine

## 2023-05-17 NOTE — Telephone Encounter (Signed)
Patient advised that he has been given clearance to hold Eliquis 2 days prior to colonoscopy scheduled for 05-25-23.  Patient advised to take last dose of Eliquis on 05-22-23, and he will be advised when to restart Eliquis by Dr Rhea Belton after the procedure.  Patient agreed to plan and verbalized understanding.  No further questions.

## 2023-05-18 ENCOUNTER — Telehealth: Payer: Self-pay | Admitting: Internal Medicine

## 2023-05-18 NOTE — Telephone Encounter (Signed)
Patient c/o Palpitations:  High priority if patient c/o lightheadedness, shortness of breath, or chest pain  How long have you had palpitations/irregular HR/ Afib? Are you having the symptoms now?  Went into afib 9/18 afternoon - patient is asymptomatic  Are you currently experiencing lightheadedness, SOB or CP?  No   Do you have a history of afib (atrial fibrillation) or irregular heart rhythm?  Yes  Have you checked your BP or HR? (document readings if available):  HR is ranging in the 70's - 115  BP is 116/60-70  Are you experiencing any other symptoms?  No

## 2023-05-18 NOTE — Telephone Encounter (Signed)
Pt called to report that he has been in Afib since this past Wednesday..he has been tolerating it... his VS:  HR is ranging in the 70's - 115  BP is 116/60-70  He is not having any palpitations just can tell by feeling his radial pulse.He denies dizziness, Sob and no fatigue.   He is taking his Eliquis meds daily.   He will continue to monitor and let us know if anything worsens.   I will send to the Afib clinic... the pt is having a Colonoscopy next Friday and he is holding his Eliquis starting Wed 05/23/23.   I advised him that he may need to reach out to the GI MD.   I will forward to the Afib clinic for review.

## 2023-05-21 ENCOUNTER — Telehealth: Payer: Self-pay | Admitting: Physician Assistant

## 2023-05-21 MED ORDER — DILTIAZEM HCL ER COATED BEADS 240 MG PO CP24: 240.0000 mg | ORAL_CAPSULE | Freq: Every day | ORAL | 3 refills | Status: DC

## 2023-05-21 NOTE — Telephone Encounter (Signed)
Per Landry Mellow PA will try increasing cardizem to 240mg  daily for better rate control. Pt in agreement.

## 2023-05-21 NOTE — Telephone Encounter (Signed)
Per review of notes patient did not want to try new AAD (Tikosyn). He wanted to wait until retired for lower stress to help improve burden. Monitor symptoms and call if worsen. Agree would reach out to GI MD.   Pt advised and will call if he needs [place].

## 2023-05-21 NOTE — Telephone Encounter (Signed)
Procedure will need to be cancelled. Please have patient contact us once cardiology workup is complete.

## 2023-05-21 NOTE — Telephone Encounter (Signed)
Inbound call from patient, states he has a procedure this Friday 9/27. States he has been having A- Fib symptoms. Patient states he has spoken to his cardiologist and might need to be hospitalized to treat it, Would like to discuss with a nurse to see if procedure needs to be reschedule.

## 2023-05-22 DIAGNOSIS — K625 Hemorrhage of anus and rectum: Secondary | ICD-10-CM

## 2023-05-22 DIAGNOSIS — Z7901 Long term (current) use of anticoagulants: Secondary | ICD-10-CM

## 2023-05-22 DIAGNOSIS — Z1211 Encounter for screening for malignant neoplasm of colon: Secondary | ICD-10-CM

## 2023-05-25 ENCOUNTER — Encounter: Payer: Medicare Other | Admitting: Internal Medicine

## 2023-06-11 NOTE — Progress Notes (Unsigned)
Cardiology Office Note   Date:  06/12/2023   ID:  Richard Vazquez, DOB Jun 14, 1956, MRN [id number]  PCP:  Garnette Gunner, MD  Cardiologist:   Dietrich Pates, MD    Patient presents for follow-up of atrial flutter.   History of Present Illness:  Richard Vazquez is a 67 year old gentleman with a history of atrial flutter, CV dz, kidney stones, GI bleeding, L   He first present with atrial flutter in  2022   Underwent TEE/DCCV.    Recurrence in 2023  Underwent DCCV.      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     I last saw the pt in clinic in Jan 2024   He has been seen by EP in interval   Wore Zio patch in July that sowed no arrhythmias or bradycardia.   The pt was due to have a colonoscopy   This was cancelled  as pt felt more afib  His diltiazem dose was increased to 240 mg  Since change in meds he says he is feeling better   Denies palpitations   Breating is OK   No CP    Current Meds  Medication Sig   apixaban (ELIQUIS) 5 MG TABS tablet Take 1 tablet (5 mg total) by mouth 2 (two) times daily.   b complex vitamins capsule Take 1 capsule by mouth daily.   bisacodyl (DULCOLAX) 5 MG EC tablet Take 5 mg by mouth as needed.   cholecalciferol (VITAMIN D3) 25 MCG (1000 UNIT) tablet Take 1,000 Units by mouth daily.   diclofenac Sodium (VOLTAREN) 1 % GEL Apply 4 g topically 4 (four) times daily as needed.   diltiazem (CARDIZEM CD) 240 MG 24 hr capsule Take 1 capsule (240 mg total) by mouth daily.   indapamide (LOZOL) 2.5 MG tablet Take 2.5 mg by mouth daily.   Iron, Ferrous Sulfate, 325 (65 Fe) MG TABS Take 325 mg by mouth every other day.   Multiple Vitamin (MULTIVITAMIN) tablet Take 1 tablet by mouth daily.   nitroGLYCERIN (NITROSTAT) 0.4 MG SL tablet Place 1 tablet (0.4 mg total) under the tongue every 5 (five) minutes as needed for  chest pain (Max 3 doses).   Omega-3 1000 MG CAPS Take 1,000 mg by mouth daily.   polyethylene glycol (MIRALAX / GLYCOLAX) 17 g packet Take 17 g by mouth daily.   tamsulosin (FLOMAX) 0.4 MG CAPS capsule Take 1 capsule by mouth daily.   triamcinolone cream (KENALOG) 0.1 % Apply 1 Application topically 2 (two) times daily.     Allergies:   Lipitor [atorvastatin], Codeine, and Latex   Past Medical History:  Diagnosis Date   Acute pain of right knee 05/16/2022   Atrial fibrillation (HCC)    Carotid artery disease (HCC)    Carotid US 09/01/2022: R 40-59, L 1-39   Colon polyps    COPD (chronic obstructive pulmonary disease) (HCC)    Former smoker 12/21/2015   Hyperlipemia    Kidney stones    Left carotid bruit 08/05/2021   Obesity    Paroxysmal atrial flutter (HCC) 05/29/2021   Sleep apnea treated with nocturnal bilevel positive airway pressure (BPAP)     Past Surgical History:  Procedure Laterality Date   A-FLUTTER ABLATION N/A 01/16/2022   Procedure: A-FLUTTER ABLATION;  Surgeon: Graciela Husbands,  Salvatore Decent, MD;  Location: [facility] INVASIVE CV LAB;  Service: Cardiovascular;  Laterality: N/A;   APPENDECTOMY     CARDIOVERSION N/A 05/31/2021   Procedure: CARDIOVERSION;  Surgeon: Chrystie Nose, MD;  Location: [facility] ENDOSCOPY;  Service: Cardiovascular;  Laterality: N/A;   CARDIOVERSION N/A 10/05/2021   Procedure: CARDIOVERSION;  Surgeon: Chrystie Nose, MD;  Location: [facility] ENDOSCOPY;  Service: Cardiovascular;  Laterality: N/A;   LEFT HEART CATH AND CORONARY ANGIOGRAPHY N/A 12/07/2021   Procedure: LEFT HEART CATH AND CORONARY ANGIOGRAPHY;  Surgeon: [name], MD;  Location: [facility] INVASIVE CV LAB;  Service: Cardiovascular;  Laterality: N/A;   LITHOTRIPSY     multiple   TEE WITHOUT CARDIOVERSION N/A 05/31/2021   Procedure: TRANSESOPHAGEAL ECHOCARDIOGRAM (TEE);  Surgeon: Chrystie Nose, MD;  Location: [facility] ENDOSCOPY;  Service: Cardiovascular;  Laterality: N/A;     Social History:  The patient  reports  that he quit smoking about 3 years ago. His smoking use included cigarettes. He started smoking about 43 years ago. He has a 60 pack-year smoking history. He has been exposed to tobacco smoke. He has never used smokeless tobacco. He reports that he does not currently use alcohol. He reports that he does not use drugs.   Family History:  The patient's family history includes Aneurysm in his paternal uncle; Aortic stenosis in his father; Cancer in his paternal uncle; Colon cancer in his paternal uncle; Diabetes in his mother; Heart disease in his father; Hyperlipidemia in his father; Hypertension in his father; Ovarian cancer in his sister.    ROS:  Please see the history of present illness. All other systems are reviewed and  Negative to the above problem except as noted.    PHYSICAL EXAM: VS:  BP 120/74   Pulse 85   Ht 6\' 1"  (1.854 m)   Wt 290 lb 6.4 oz (131.7 kg)   SpO2 95%   BMI 38.31 kg/m   GEN: Obese 67 year old in no acute distress  HEENT: normal  Neck: no JVD, no carotid bruit Cardiac: RRR  Normal S1, S2 No significant murmurs  Triv LE edema  Respiratory:  clear to auscultation GI: Distended.  Soft.  Nontender.  No hepatomegaly    EKG:  EKG is ordered today. SR  66 bpm   First degree AV block  PR 228 msec     Lipid Panel    Component Value Date/Time   CHOL 111 12/06/2021 0230   CHOL 183 08/05/2021 0924   TRIG 187 (H) 12/06/2021 0230   HDL 34 (L) 12/06/2021 0230   HDL 36 (L) 08/05/2021 0924   CHOLHDL 3.3 12/06/2021 0230   VLDL 37 12/06/2021 0230   LDLCALC 40 12/06/2021 0230   LDLCALC 126 (H) 08/05/2021 0924      Wt Readings from Last 3 Encounters:  06/12/23 290 lb 6.4 oz (131.7 kg)  05/15/23 282 lb 4 oz (128 kg)  03/29/23 272 lb (123.4 kg)      ASSESSMENT AND PLAN:  1.  Atrial flutter.  Pt s/p ablation  Currently on ELiquis and flecanide and diltiazem   COntinue     He is in SR now    Patient has appt with EP later this winter      2 CV disease.  Moderate  plaquing of carotids by [facility] in Jan 2024  3. HL   Keep on statin  Excellent control  LDL 40  4  Kidney stones   asymptomatic   5  metabolics A1C 6.3  Reviewed diet    Limit carbs   6  GI  Pt OK to proceed with colonoscopy   Would hold Eliquis a few days prior, resume after  7  OSA  Continue with CPAP  Check BMET, TSH, A1C and lipomed   F/U in Summer 2025     Current medicines are reviewed at length with the patient today.  The patient does not have concerns regarding medicines.  Signed, Dietrich Pates, MD  06/12/2023 6:18 PM    [facility] [address] [place], [place], [place]  [address] Phone: [phone]; Fax: [phone]

## 2023-06-12 ENCOUNTER — Ambulatory Visit: Payer: Medicare Other | Attending: Internal Medicine | Admitting: Internal Medicine

## 2023-06-12 ENCOUNTER — Encounter: Payer: Self-pay | Admitting: Internal Medicine

## 2023-06-12 VITALS — BP 120/74 | HR 85 | Ht 73.0 in | Wt 290.4 lb

## 2023-06-12 DIAGNOSIS — G4733 Obstructive sleep apnea (adult) (pediatric): Secondary | ICD-10-CM | POA: Diagnosis not present

## 2023-06-12 DIAGNOSIS — I4892 Unspecified atrial flutter: Secondary | ICD-10-CM

## 2023-06-12 NOTE — Patient Instructions (Signed)
Medication Instructions:   *If you need a refill on your cardiac medications before your next appointment, please call your pharmacy*   Lab Work:  If you have labs (blood work) drawn today and your tests are completely normal, you will receive your results only by: MyChart Message (if you have MyChart) OR A paper copy in the mail If you have any lab test that is abnormal or we need to change your treatment, we will call you to review the results.   Testing/Procedures:    Follow-Up: At [facility], you and your health needs are our priority.  As part of our continuing mission to provide you with exceptional heart care, we have created designated Provider Care Teams.  These Care Teams include your primary Cardiologist (physician) and Advanced Practice Providers (APPs -  Physician Assistants and Nurse Practitioners) who all work together to provide you with the care you need, when you need it.  We recommend signing up for the patient portal called "MyChart".  Sign up information is provided on this After Visit Summary.  MyChart is used to connect with patients for Virtual Visits (Telemedicine).  Patients are able to view lab/test results, encounter notes, upcoming appointments, etc.  Non-urgent messages can be sent to your provider as well.   To learn more about what you can do with MyChart, go to [email or web address].    Your next appointment:   6 month(s)  Provider:   Dietrich Pates, MD     Other Instructions

## 2023-06-14 NOTE — Telephone Encounter (Signed)
-----   Message from Carie Caddy Pyrtle sent at 06/13/2023  4:17 PM EDT ----- Karen Kitchens I can see where you were involved in the correspondence regarding his colonoscopy Dr. Tenny Craw has cleared him for colonoscopy and we can get that rescheduled. Thanks JMP ----- Message ----- From: Pricilla Riffle, MD Sent: 06/12/2023  10:13 PM EDT To: Beverley Fiedler, MD  Tawni Carnes   I saw Mr Cerda today   He is feeling better   In SR He is OK from cardiac standpoint to proceed with colonoscopy

## 2023-06-14 NOTE — Telephone Encounter (Signed)
Patient has rescheduled his colonoscopy to 08/27/23 at 330 pm. New instructions have been made available via mychart for patient to review as well.

## 2023-06-14 NOTE — Telephone Encounter (Signed)
Left message for patient to call back  

## 2023-06-19 ENCOUNTER — Encounter: Payer: Self-pay | Admitting: Family Medicine

## 2023-06-19 ENCOUNTER — Ambulatory Visit (INDEPENDENT_AMBULATORY_CARE_PROVIDER_SITE_OTHER): Payer: Medicare Other | Admitting: Family Medicine

## 2023-06-19 VITALS — BP 128/76 | HR 72 | Temp 97.8°F | Wt 290.6 lb

## 2023-06-19 DIAGNOSIS — R21 Rash and other nonspecific skin eruption: Secondary | ICD-10-CM

## 2023-06-19 DIAGNOSIS — E782 Mixed hyperlipidemia: Secondary | ICD-10-CM | POA: Diagnosis not present

## 2023-06-19 DIAGNOSIS — Z0000 Encounter for general adult medical examination without abnormal findings: Secondary | ICD-10-CM | POA: Diagnosis not present

## 2023-06-19 DIAGNOSIS — I878 Other specified disorders of veins: Secondary | ICD-10-CM | POA: Diagnosis not present

## 2023-06-19 DIAGNOSIS — I83812 Varicose veins of left lower extremities with pain: Secondary | ICD-10-CM

## 2023-06-19 DIAGNOSIS — D649 Anemia, unspecified: Secondary | ICD-10-CM | POA: Insufficient documentation

## 2023-06-19 DIAGNOSIS — R600 Localized edema: Secondary | ICD-10-CM

## 2023-06-19 DIAGNOSIS — R7303 Prediabetes: Secondary | ICD-10-CM

## 2023-06-19 DIAGNOSIS — E559 Vitamin D deficiency, unspecified: Secondary | ICD-10-CM | POA: Diagnosis not present

## 2023-06-19 DIAGNOSIS — I10 Essential (primary) hypertension: Secondary | ICD-10-CM | POA: Diagnosis not present

## 2023-06-19 DIAGNOSIS — L409 Psoriasis, unspecified: Secondary | ICD-10-CM

## 2023-06-19 DIAGNOSIS — D508 Other iron deficiency anemias: Secondary | ICD-10-CM | POA: Diagnosis not present

## 2023-06-19 DIAGNOSIS — E66812 Obesity, class 2: Secondary | ICD-10-CM | POA: Diagnosis not present

## 2023-06-19 DIAGNOSIS — N401 Enlarged prostate with lower urinary tract symptoms: Secondary | ICD-10-CM

## 2023-06-19 DIAGNOSIS — I4819 Other persistent atrial fibrillation: Secondary | ICD-10-CM | POA: Diagnosis not present

## 2023-06-19 DIAGNOSIS — R350 Frequency of micturition: Secondary | ICD-10-CM | POA: Diagnosis not present

## 2023-06-19 DIAGNOSIS — G4733 Obstructive sleep apnea (adult) (pediatric): Secondary | ICD-10-CM

## 2023-06-19 DIAGNOSIS — I25118 Atherosclerotic heart disease of native coronary artery with other forms of angina pectoris: Secondary | ICD-10-CM

## 2023-06-19 DIAGNOSIS — Z6838 Body mass index (BMI) 38.0-38.9, adult: Secondary | ICD-10-CM

## 2023-06-19 LAB — CBC WITH DIFFERENTIAL/PLATELET
Basophils Absolute: 0.1 10*3/uL (ref 0.0–0.1)
Basophils Relative: 1 % (ref 0.0–3.0)
Eosinophils Absolute: 0.6 10*3/uL (ref 0.0–0.7)
Eosinophils Relative: 6.7 % — ABNORMAL HIGH (ref 0.0–5.0)
HCT: 44.4 % (ref 39.0–52.0)
Hemoglobin: 14.5 g/dL (ref 13.0–17.0)
Lymphocytes Relative: 14.2 % (ref 12.0–46.0)
Lymphs Abs: 1.3 10*3/uL (ref 0.7–4.0)
MCHC: 32.6 g/dL (ref 30.0–36.0)
MCV: 91.1 fL (ref 78.0–100.0)
Monocytes Absolute: 1 10*3/uL (ref 0.1–1.0)
Monocytes Relative: 10.3 % (ref 3.0–12.0)
Neutro Abs: 6.4 10*3/uL (ref 1.4–7.7)
Neutrophils Relative %: 67.8 % (ref 43.0–77.0)
Platelets: 233 10*3/uL (ref 150.0–400.0)
RBC: 4.88 Mil/uL (ref 4.22–5.81)
RDW: 15.1 % (ref 11.5–15.5)
WBC: 9.4 10*3/uL (ref 4.0–10.5)

## 2023-06-19 LAB — LIPID PANEL
Cholesterol: 127 mg/dL (ref 0–200)
HDL: 38.3 mg/dL — ABNORMAL LOW (ref >39.00)
LDL Cholesterol: 56 mg/dL (ref 0–99)
NonHDL: 89.07
Total CHOL/HDL Ratio: 3
Triglycerides: 164 mg/dL — ABNORMAL HIGH (ref 0.0–149.0)
VLDL: 32.8 mg/dL (ref 0.0–40.0)

## 2023-06-19 LAB — VITAMIN B12: Vitamin B-12: 485 pg/mL (ref 211–911)

## 2023-06-19 LAB — PSA: PSA: 0.22 ng/mL (ref 0.10–4.00)

## 2023-06-19 LAB — COMPREHENSIVE METABOLIC PANEL
ALT: 14 U/L (ref 0–53)
AST: 15 U/L (ref 0–37)
Albumin: 4.1 g/dL (ref 3.5–5.2)
Alkaline Phosphatase: 68 U/L (ref 39–117)
BUN: 27 mg/dL — ABNORMAL HIGH (ref 6–23)
CO2: 27 meq/L (ref 19–32)
Calcium: 8.8 mg/dL (ref 8.4–10.5)
Chloride: 104 meq/L (ref 96–112)
Creatinine, Ser: 1.05 mg/dL (ref 0.40–1.50)
GFR: 73.44 mL/min (ref >60.00)
Glucose, Bld: 118 mg/dL — ABNORMAL HIGH (ref 70–99)
Potassium: 3.6 meq/L (ref 3.5–5.1)
Sodium: 142 meq/L (ref 135–145)
Total Bilirubin: 0.6 mg/dL (ref 0.2–1.2)
Total Protein: 6.7 g/dL (ref 6.0–8.3)

## 2023-06-19 LAB — FOLATE: Folate: >24.2 ng/mL (ref >5.9)

## 2023-06-19 LAB — FERRITIN: Ferritin: 46.6 ng/mL (ref 22.0–322.0)

## 2023-06-19 LAB — HEMOGLOBIN A1C: Hgb A1c MFr Bld: 6.4 % (ref 4.6–6.5)

## 2023-06-19 LAB — MAGNESIUM: Magnesium: 1.8 mg/dL (ref 1.5–2.5)

## 2023-06-19 MED ORDER — TRIAMCINOLONE ACETONIDE 0.1 % EX CREA
1.0000 | TOPICAL_CREAM | Freq: Two times a day (BID) | CUTANEOUS | 0 refills | Status: AC
Start: 2023-06-19 — End: ?

## 2023-06-19 MED ORDER — FUROSEMIDE 20 MG PO TABS
20.0000 mg | ORAL_TABLET | Freq: Every day | ORAL | 3 refills | Status: DC | PRN
Start: 2023-06-19 — End: 2023-07-02

## 2023-06-19 NOTE — Progress Notes (Unsigned)
Assessment/Plan:   Problem List Items Addressed This Visit   None   There are no discontinued medications.  No follow-ups on file.    Subjective:   Encounter date: 06/19/2023  Richard Vazquez is a 67 y.o. male who has Atrial flutter (HCC); Hyperlipemia; Hematuria; Carotid artery disease (HCC); Persistent atrial fibrillation (HCC); CAD (coronary artery disease); Shift work sleep disorder; OSA (obstructive sleep apnea); Class 2 obesity due to excess calories without serious comorbidity with body mass index (BMI) of 35.0 to 35.9 in adult; Renal stones; Prediabetes; Chronic constipation; Rash; Dyspnea on exertion; Bilateral edema of lower extremity; and Vitamin D deficiency on their problem list..   He  has a past medical history of Acute pain of right knee (05/16/2022), Atrial fibrillation (HCC), Carotid artery disease (HCC), Colon polyps, COPD (chronic obstructive pulmonary disease) (HCC), Former smoker (12/21/2015), Hyperlipemia, Kidney stones, Left carotid bruit (08/05/2021), Obesity, Paroxysmal atrial flutter (HCC) (05/29/2021), and Sleep apnea treated with nocturnal bilevel positive airway pressure (BPAP).[name]   He presents with chief complaint of Medical Management of Chronic Issues ( 6 months (around 06/19/2023) for BP, fasting labs, HLD. Swelling in lower legs and feet./Fasting. Rx refill kenalog cream. Would like to discuss shingles vaccine. Colonoscopy scheduled 08/27/23) .   HPI:   ROS  Past Surgical History:  Procedure Laterality Date   A-FLUTTER ABLATION N/A 01/16/2022   Procedure: A-FLUTTER ABLATION;  Surgeon: Duke Salvia, MD;  Location: [facility] INVASIVE CV LAB;  Service: Cardiovascular;  Laterality: N/A;   APPENDECTOMY     CARDIOVERSION N/A 05/31/2021   Procedure: CARDIOVERSION;  Surgeon: Chrystie Nose, MD;  Location: [facility] ENDOSCOPY;  Service: Cardiovascular;  Laterality: N/A;   CARDIOVERSION N/A 10/05/2021   Procedure: CARDIOVERSION;  Surgeon: Chrystie Nose, MD;   Location: [facility] ENDOSCOPY;  Service: Cardiovascular;  Laterality: N/A;   LEFT HEART CATH AND CORONARY ANGIOGRAPHY N/A 12/07/2021   Procedure: LEFT HEART CATH AND CORONARY ANGIOGRAPHY;  Surgeon: [name], MD;  Location: [facility] INVASIVE CV LAB;  Service: Cardiovascular;  Laterality: N/A;   LITHOTRIPSY     multiple   TEE WITHOUT CARDIOVERSION N/A 05/31/2021   Procedure: TRANSESOPHAGEAL ECHOCARDIOGRAM (TEE);  Surgeon: Chrystie Nose, MD;  Location: [facility] ENDOSCOPY;  Service: Cardiovascular;  Laterality: N/A;    Outpatient Medications Prior to Visit  Medication Sig Dispense Refill   apixaban (ELIQUIS) 5 MG TABS tablet Take 1 tablet (5 mg total) by mouth 2 (two) times daily. 180 tablet 3   b complex vitamins capsule Take 1 capsule by mouth daily.     bisacodyl (DULCOLAX) 5 MG EC tablet Take 5 mg by mouth as needed.     cholecalciferol (VITAMIN D3) 25 MCG (1000 UNIT) tablet Take 1,000 Units by mouth daily.     diclofenac Sodium (VOLTAREN) 1 % GEL Apply 4 g topically 4 (four) times daily as needed. 100 g 3   diltiazem (CARDIZEM CD) 240 MG 24 hr capsule Take 1 capsule (240 mg total) by mouth daily. 30 capsule 3   indapamide (LOZOL) 2.5 MG tablet Take 2.5 mg by mouth daily.     Iron, Ferrous Sulfate, 325 (65 Fe) MG TABS Take 325 mg by mouth every other day.     Multiple Vitamin (MULTIVITAMIN) tablet Take 1 tablet by mouth daily.     nitroGLYCERIN (NITROSTAT) 0.4 MG SL tablet Place 1 tablet (0.4 mg total) under the tongue every 5 (five) minutes as needed for chest pain (Max 3 doses). 25 tablet 2   Omega-3 1000 MG  CAPS Take 1,000 mg by mouth daily.     polyethylene glycol (MIRALAX / GLYCOLAX) 17 g packet Take 17 g by mouth daily.     tamsulosin (FLOMAX) 0.4 MG CAPS capsule Take 1 capsule by mouth daily.     triamcinolone cream (KENALOG) 0.1 % Apply 1 Application topically 2 (two) times daily. 30 g 0   rosuvastatin (CRESTOR) 20 MG tablet Take 1 tablet (20 mg total) by mouth daily. 90 tablet 3   No  facility-administered medications prior to visit.    Family History  Problem Relation Age of Onset   Diabetes Mother    Heart disease Father        smoker   Aortic stenosis Father    Hyperlipidemia Father    Hypertension Father    Ovarian cancer Sister    Colon cancer Paternal Uncle    Aneurysm Paternal Uncle        Brain   Cancer Paternal Uncle        all in abdomin, ? colon cancer    Social History   Socioeconomic History   Marital status: Married    Spouse name: Not on file   Number of children: 2   Years of education: Not on file   Highest education level: 12th grade  Occupational History   Occupation: retired  Tobacco Use   Smoking status: Former    Current packs/day: 0.00    Average packs/day: 1.5 packs/day for 40.0 years (60.0 ttl pk-yrs)    Types: Cigarettes    Start date: 12/09/1979    Quit date: 12/09/2019    Years since quitting: 3.5    Passive exposure: Past   Smokeless tobacco: Never  Vaping Use   Vaping status: Never Used  Substance and Sexual Activity   Alcohol use: Not Currently   Drug use: Never   Sexual activity: Not Currently  Other Topics Concern   Not on file  Social History Narrative   Not on file   Social Determinants of Health   Financial Resource Strain: Low Risk  (06/15/2023)   Overall Financial Resource Strain (CARDIA)    Difficulty of Paying Living Expenses: Not very hard  Food Insecurity: No Food Insecurity (06/15/2023)   Hunger Vital Sign    Worried About Running Out of Food in the Last Year: Never true    Ran Out of Food in the Last Year: Never true  Transportation Needs: No Transportation Needs (06/15/2023)   PRAPARE - Administrator, Civil Service (Medical): No    Lack of Transportation (Non-Medical): No  Physical Activity: Unknown (06/15/2023)   Exercise Vital Sign    Days of Exercise per Week: Patient declined    Minutes of Exercise per Session: 10 min  Recent Concern: Physical Activity - Insufficiently  Active (04/14/2023)   Exercise Vital Sign    Days of Exercise per Week: 4 days    Minutes of Exercise per Session: 10 min  Stress: No Stress Concern Present (06/15/2023)   [employer] of Occupational Health - Occupational Stress Questionnaire    Feeling of Stress : Only a little  Social Connections: Unknown (06/15/2023)   Social Connection and Isolation Panel [NHANES]    Frequency of Communication with Friends and Family: Twice a week    Frequency of Social Gatherings with Friends and Family: Patient declined    Attends Religious Services: Never    Database administrator or Organizations: No    Attends Engineer, structural: Patient declined  Marital Status: Married  Catering manager Violence: Not At Risk (04/16/2023)   Humiliation, Afraid, Rape, and Kick questionnaire    Fear of Current or Ex-Partner: No    Emotionally Abused: No    Physically Abused: No    Sexually Abused: No                                                                                                  Objective:  Physical Exam: BP 128/76 (BP Location: Left Arm, Patient Position: Sitting, Cuff Size: Large)   Pulse 72   Temp 97.8 F (36.6 C) (Temporal)   Wt 290 lb 9.6 oz (131.8 kg)   SpO2 99%   BMI 38.34 kg/m    Wt Readings from Last 3 Encounters:  06/19/23 290 lb 9.6 oz (131.8 kg)  06/12/23 290 lb 6.4 oz (131.7 kg)  05/15/23 282 lb 4 oz (128 kg)     Physical Exam  No results found.  No results found for this or any previous visit (from the past 2160 hour(s)).      Garner Nash, MD, MS

## 2023-06-19 NOTE — Patient Instructions (Signed)
Apply the triamcinolone cream to the rash around your neck as directed, and continue using lotion to maintain skin moisture. Monitor your blood pressure and cholesterol levels and follow up with prescribed medications, including rosuvastatin for cholesterol and diltiazem for atrial fibrillation. Increase your physical activity gradually, such as using the exercise bike you ordered and walking more regularly. Use the BiPAP machine at night for sleep apnea and discuss any concerns about air flow with your sleep specialist. Schedule an appointment with a vein specialist to address the leg swelling and varicose veins as recommended.

## 2023-06-20 ENCOUNTER — Encounter: Payer: Self-pay | Admitting: Internal Medicine

## 2023-06-20 LAB — IRON AND TIBC
Iron Saturation: 21 % (ref 15–55)
Iron: 66 ug/dL (ref 38–169)
Total Iron Binding Capacity: 320 ug/dL (ref 250–450)
UIBC: 254 ug/dL (ref 111–343)

## 2023-06-20 LAB — PRO B NATRIURETIC PEPTIDE: NT-Pro BNP: 181 pg/mL (ref 0–376)

## 2023-06-21 DIAGNOSIS — N401 Enlarged prostate with lower urinary tract symptoms: Secondary | ICD-10-CM | POA: Insufficient documentation

## 2023-06-21 DIAGNOSIS — I10 Essential (primary) hypertension: Secondary | ICD-10-CM | POA: Insufficient documentation

## 2023-06-21 NOTE — Assessment & Plan Note (Signed)
Check CBC and associated anemia labs

## 2023-06-21 NOTE — Assessment & Plan Note (Signed)
At goal on current therapy  Continue current medications

## 2023-06-21 NOTE — Assessment & Plan Note (Signed)
Well controlled on tamsulosin Plan: Continue Flomax. Recheck PSA.

## 2023-06-21 NOTE — Assessment & Plan Note (Signed)
Bilateral leg swelling, more pronounced on the left with varicose veins.  Plan: Trial daily Lasix 20 mg as needed. Recommend compression stockings. Referral to vascular surgery for further evaluation. Use moisturizers for skin dryness; continue triamcinolone if necessary.

## 2023-06-21 NOTE — Assessment & Plan Note (Addendum)
Working on [employer] Recommend additional exercise Nutrition referral for dietary modifications. Discuss potential weight loss medications GLP-1/SGLT2 e.g., Wegovy with potential benefit of additional cardiac protection

## 2023-06-21 NOTE — Assessment & Plan Note (Signed)
Lipid panel less than 70 on 11/2021 ?Well-controlled on rosuvastatin 20 mg daily ?

## 2023-06-21 NOTE — Assessment & Plan Note (Addendum)
Recent frequent episodes of AFib with associated symptoms. Status post 3 ablations  Plan: Continue diltiazem 240 mg. Hold Eliquis 2 days prior to the scheduled colonoscopy. Follow-up with cardiology as needed. Check Cardiac Restratification Labs as ordered for optimization including hyperlipidemia, blood sugar/prediabetes, renal function, electrolyte assessment

## 2023-06-21 NOTE — Assessment & Plan Note (Signed)
Stable on triamcinolone ointment

## 2023-06-22 ENCOUNTER — Other Ambulatory Visit: Payer: Self-pay | Admitting: *Deleted

## 2023-06-22 ENCOUNTER — Ambulatory Visit (HOSPITAL_COMMUNITY)
Admission: RE | Admit: 2023-06-22 | Discharge: 2023-06-22 | Disposition: A | Discharge status: Home / Self Care | Payer: Medicare Other | Source: Ambulatory Visit | Attending: Vascular Surgery | Admitting: Vascular Surgery

## 2023-06-22 DIAGNOSIS — I878 Other specified disorders of veins: Secondary | ICD-10-CM | POA: Diagnosis not present

## 2023-06-23 LAB — VITAMIN D 1,25 DIHYDROXY
Vitamin D 1, 25 (OH)2 Total: 35 pg/mL (ref 18–72)
Vitamin D2 1, 25 (OH)2: <8 pg/mL
Vitamin D3 1, 25 (OH)2: 35 pg/mL

## 2023-06-23 LAB — THYROID PANEL WITH TSH
Free Thyroxine Index: 1.8 (ref 1.4–3.8)
T3 Uptake: 34 % (ref 22–35)
T4, Total: 5.4 ug/dL (ref 4.9–10.5)
TSH: 4.89 m[IU]/L — ABNORMAL HIGH (ref 0.40–4.50)

## 2023-06-26 ENCOUNTER — Encounter: Payer: Self-pay | Admitting: Cardiology

## 2023-06-26 ENCOUNTER — Ambulatory Visit: Payer: Medicare Other | Attending: Cardiology | Admitting: Cardiology

## 2023-06-26 VITALS — BP 128/68 | HR 76 | Ht 73.0 in | Wt 290.2 lb

## 2023-06-26 DIAGNOSIS — I4892 Unspecified atrial flutter: Secondary | ICD-10-CM | POA: Diagnosis not present

## 2023-06-26 DIAGNOSIS — G4733 Obstructive sleep apnea (adult) (pediatric): Secondary | ICD-10-CM

## 2023-06-26 NOTE — Addendum Note (Signed)
Addended by: Luellen Pucker on: 06/26/2023 09:16 AM   Modules accepted: Orders

## 2023-06-26 NOTE — Addendum Note (Signed)
Addended by: Anselm Pancoast on: 06/26/2023 09:02 AM   Modules accepted: Orders

## 2023-06-26 NOTE — Patient Instructions (Signed)
Medication Instructions:  Your physician recommends that you continue on your current medications as directed. Please refer to the Current Medication list given to you today.  *If you need a refill on your cardiac medications before your next appointment, please call your pharmacy*   Lab Work: None.  If you have labs (blood work) drawn today and your tests are completely normal, you will receive your results only by: MyChart Message (if you have MyChart) OR A paper copy in the mail If you have any lab test that is abnormal or we need to change your treatment, we will call you to review the results.   Testing/Procedures: None.   Follow-Up:  Your next appointment:   1 year(s)  Provider:   Dr. Armanda Magic, MD   Other Instructions You have been referred back to the AFIB clinic.

## 2023-06-26 NOTE — Progress Notes (Addendum)
Date:  06/26/2023   ID:  Richard Vazquez, DOB 06-Sep-1955, MRN [id number] The patient was identified using 2 identifiers.  PCP:  Garnette Gunner, MD   [facility] Providers Cardiologist:  Dietrich Pates, MD Cardiology APP:  Beatrice Lecher, PA-C     Chief Complaint:  OSA  History of Present Illness:    Richard Vazquez is a 67 y.o. male with a hx of Carotid artery stenosis, remote tobacco use, HLD and PAFlutter.  During his DCCV, he had significant O2 desats and was told that he might have OSA.  His wife said that he snored very badly and would feel tired when he would wake up and then get sleepy at work.    Sleep study demonstrated severe OSA with an AHI of 62.6/hr and nocturnal Hypoxemia with O2 sats as low as 77%.  He underwent CPAP titration but due to ongoing events was transitioned to BiPAP and placed on BiPAP at 11/7cm H2O.    He is doing well with his PAP device and thinks that he has gotten used to it.  He tolerates the mask and feels the pressure is adequate.  Since going on PAP he feels rested in the am and has no significant daytime sleepiness.  He denies any significant mouth or nasal dryness or nasal congestion.  He does not think that he snores.    Past Medical History:  Diagnosis Date   Acute pain of right knee 05/16/2022   Atrial fibrillation (HCC)    Carotid artery disease (HCC)    Carotid US 09/01/2022: R 40-59, L 1-39   Colon polyps    COPD (chronic obstructive pulmonary disease) (HCC)    Former smoker 12/21/2015   Hyperlipemia    Kidney stones    Left carotid bruit 08/05/2021   Obesity    Paroxysmal atrial flutter (HCC) 05/29/2021   Sleep apnea treated with nocturnal bilevel positive airway pressure (BPAP)    Past Surgical History:  Procedure Laterality Date   A-FLUTTER ABLATION N/A 01/16/2022   Procedure: A-FLUTTER ABLATION;  Surgeon: Duke Salvia, MD;  Location: [facility] INVASIVE CV LAB;  Service: Cardiovascular;  Laterality: N/A;   APPENDECTOMY      CARDIOVERSION N/A 05/31/2021   Procedure: CARDIOVERSION;  Surgeon: Chrystie Nose, MD;  Location: [facility] ENDOSCOPY;  Service: Cardiovascular;  Laterality: N/A;   CARDIOVERSION N/A 10/05/2021   Procedure: CARDIOVERSION;  Surgeon: Chrystie Nose, MD;  Location: [facility] ENDOSCOPY;  Service: Cardiovascular;  Laterality: N/A;   LEFT HEART CATH AND CORONARY ANGIOGRAPHY N/A 12/07/2021   Procedure: LEFT HEART CATH AND CORONARY ANGIOGRAPHY;  Surgeon: [name], MD;  Location: [facility] INVASIVE CV LAB;  Service: Cardiovascular;  Laterality: N/A;   LITHOTRIPSY     multiple   TEE WITHOUT CARDIOVERSION N/A 05/31/2021   Procedure: TRANSESOPHAGEAL ECHOCARDIOGRAM (TEE);  Surgeon: Chrystie Nose, MD;  Location: [facility] ENDOSCOPY;  Service: Cardiovascular;  Laterality: N/A;     Current Meds  Medication Sig   apixaban (ELIQUIS) 5 MG TABS tablet Take 1 tablet (5 mg total) by mouth 2 (two) times daily.   b complex vitamins capsule Take 1 capsule by mouth daily.   bisacodyl (DULCOLAX) 5 MG EC tablet Take 5 mg by mouth as needed.   cholecalciferol (VITAMIN D3) 25 MCG (1000 UNIT) tablet Take 1,000 Units by mouth daily.   diclofenac Sodium (VOLTAREN) 1 % GEL Apply 4 g topically 4 (four) times daily as needed.   diltiazem (CARDIZEM CD) 240 MG  24 hr capsule Take 1 capsule (240 mg total) by mouth daily.   furosemide (LASIX) 20 MG tablet Take 1 tablet (20 mg total) by mouth daily as needed for edema.   indapamide (LOZOL) 2.5 MG tablet Take 2.5 mg by mouth daily.   Iron, Ferrous Sulfate, 325 (65 Fe) MG TABS Take 325 mg by mouth every other day.   Multiple Vitamin (MULTIVITAMIN) tablet Take 1 tablet by mouth daily.   nitroGLYCERIN (NITROSTAT) 0.4 MG SL tablet Place 1 tablet (0.4 mg total) under the tongue every 5 (five) minutes as needed for chest pain (Max 3 doses).   Omega-3 1000 MG CAPS Take 1,000 mg by mouth daily.   polyethylene glycol (MIRALAX / GLYCOLAX) 17 g packet Take 17 g by mouth daily.   tamsulosin (FLOMAX) 0.4 MG  CAPS capsule Take 1 capsule by mouth daily.   triamcinolone cream (KENALOG) 0.1 % Apply 1 Application topically 2 (two) times daily.     Allergies:   Lipitor [atorvastatin], Codeine, and Latex   Social History   Tobacco Use   Smoking status: Former    Current packs/day: 0.00    Average packs/day: 1.5 packs/day for 40.0 years (60.0 ttl pk-yrs)    Types: Cigarettes    Start date: 12/09/1979    Quit date: 12/09/2019    Years since quitting: 3.5    Passive exposure: Past   Smokeless tobacco: Never  Vaping Use   Vaping status: Never Used  Substance Use Topics   Alcohol use: Not Currently   Drug use: Never     Family Hx: The patient's family history includes Aneurysm in his paternal uncle; Aortic stenosis in his father; Cancer in his paternal uncle; Colon cancer in his paternal uncle; Diabetes in his mother; Heart disease in his father; Hyperlipidemia in his father; Hypertension in his father; Ovarian cancer in his sister.  ROS:   Please see the history of present illness.     All other systems reviewed and are negative.   Prior Sleep studies:   The following studies were reviewed today:  Home sleep study, PAP titration, PAP compliance download  Labs/Other Tests and Data Reviewed:    EKG Interpretation Date/Time:  Tuesday June 26 2023 09:00:46 EDT Ventricular Rate:  96 PR Interval:    QRS Duration:  126 QT Interval:  198 QTC Calculation: 250 R Axis:   77  Text Interpretation: Atrial Flutter with variable block Right bundle branch block When compared with ECG of 19-Mar-2023 18:23,  Atrial Flutter is new Confirmed by Armanda Magic [phone]) on 06/26/2023 9:11:43 AM   Recent Labs: 06/19/2023: ALT 14; BUN 27; Creatinine, Ser 1.05; Hemoglobin 14.5; Magnesium 1.8; NT-Pro BNP 181; Platelets 233.0; Potassium 3.6; Sodium 142; TSH 4.89    Wt Readings from Last 3 Encounters:  06/26/23 290 lb 3.2 oz (131.6 kg)  06/19/23 290 lb 9.6 oz (131.8 kg)  06/12/23 290 lb 6.4 oz (131.7  kg)         Objective:    Vital Signs:  BP 128/68   Pulse 76   Ht 6\' 1"  (1.854 m)   Wt 290 lb 3.2 oz (131.6 kg)   SpO2 95%   BMI 38.29 kg/m   GEN: Well nourished, well developed in no acute distress HEENT: Normal NECK: No JVD; No carotid bruits LYMPHATICS: No lymphadenopathy CARDIAC:RRR, no murmurs, rubs, gallops.  Occasional ectopy RESPIRATORY:  Clear to auscultation without rales, wheezing or rhonchi  ABDOMEN: Soft, non-tender, non-distended MUSCULOSKELETAL:  No edema; No deformity  SKIN:  Warm and dry NEUROLOGIC:  Alert and oriented x 3 PSYCHIATRIC:  Normal affect   ASSESSMENT & PLAN:    OSA - The patient is tolerating PAP therapy well without any problems. The PAP download performed by his DME was personally reviewed and interpreted by me today and showed an AHI of 0.5/hr on BiPAP at 11/7 cm H2O with 100% compliance in using more than 4 hours nightly.  The patient has been using and benefiting from PAP use and will continue to benefit from therapy.   PAFlutter -he is back in atrial flutter -continue apixaban 5mg  BID and Cardizem CD 240mg  daily -will get back into afib clinic  Medication Adjustments/Labs and Tests Ordered: Current medicines are reviewed at length with the patient today.  Concerns regarding medicines are outlined above.   Tests Ordered: No orders of the defined types were placed in this encounter.   Medication Changes: No orders of the defined types were placed in this encounter.   Follow Up:  In Person in 1 year(s)  Signed, Armanda Magic, MD  06/26/2023 8:52 AM    [facility]

## 2023-06-27 NOTE — Telephone Encounter (Signed)
Labs look good (CBC, electrolytes, kidney function, lipids)

## 2023-06-28 NOTE — Progress Notes (Signed)
Cardiology Office Note Date:  06/28/2023  Patient ID:  Richard Vazquez, Richard Vazquez [birth year], MRN [id number] PCP:  Garnette Gunner, MD  Cardiologist:  Dr Tenny Craw Electrophysiologist: Dr. Graciela Husbands    Chief Complaint:  planned 3 mo   History of Present Illness: Richard Vazquez is a 67 y.o. 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 It was elected to ablate his cavotricuspid isthmus; unfortunately, within a couple of weeks he had recurrence of a flutter. Flecainide was initiated   He saw Dr. Graciela Husbands 02/22/22, maintaining then SR, stable intervals  He saw Dr. Tenny Craw 09/25/22, s/p lithotripsy complicated by hematoma requiring interruption in [facility], doing well otherwise, no changes were made, planned for labs  I saw him 6//21/24 All in all doing well He works still, second shift at a warehouse, on his feet walking quite a bit with good exertional capacity, will get a lttle winded with longer distances, no difficulties with usual rounds, or ADLs No CP No near syncope or syncope. He for years has had some intermittent hematuria and hematochezia, unchanged, known renal stones Due for his colonoscopy (has been >10 years), not scheduled.  June 1st he was alerted by his watch (before he got out of bed) to a high HR, he felt his heart beat fast, though didn't feel unwell. Went about his day, felt a little "off", but OK. Ran some errands, HR was "all over the pace", 80's-130s once home went upstairs and back down, started to feel a little weak and laid down, HR then to 180s, settled and eventually back to normal rhythm. He has observed HRs in the last several months anywhere from the 40's -110's only of late with the very fast rate Reports  excellent medication compliance He has also had low HR alerts to the 40's as well, one I saw on his phone today about midnight though that is in the middle of hisshoft at work. When it is slow he does feel a little fatigued He had a new RBBB and noted prolongation of his intervals and increasing AFib burden the flecainide stopped Planned for monitoring  and f/u EKG and for alternative AAD options  He saw AFib clinic 03/09/23, doing well, reported some symptoms while wearing the monitor without particular bradycardia, minimal AFib w/CVR Discussed perhaps Tikosyn, would need to change his indapamide to an alternative diuretic. Does not appear that they made a definitve decision, with plans to   ER visit 03/19/23: woke with palpitations, rates 130's, note reports AFlutter 124 > DCCV > SR feeling better > discharged   I saw him 03/29/23 He is accompanied by his wife today He feels well, the ER visit is the 1st in a long time that his AF (flutter) has lasted so long and stayed so fast. Typically when he has it, it is generally short lived 10 minutes or so. He feels well though otherwise. No CP, SOB He retires this month, very much looking forward to getting off mid/late shift work and off the [employer]. His wife has been keeping a log and notes that when he is particularly stressed with work seems to be a clear trigger for him He scratched his ear and it bleed quite a bit, otherwise no bleeding or signs of bleeding He is inclined at  this juncture to keep an eye on his arrhythmia burden without trying a new AAD or making changes. 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 Mayford Knife for sleep visit on 10/29  was in AFlutter  TODAY He is accompanied by his wife Post-retirement got a little "lazy" gained some weight, but getting back to  walking. He has some fear about walking too much, too far, doing to much and triggering AF He rarely has any cardiac awareness/palpitations, mostly knows he is out of rhythm because his watch tells him, though he does note when in Afib gets winded easier.  He has not had any CP No rest SOB No near syncope or syncope No bleeding, signs of bleeding  Saw [facility], wearing compression hose on LLE (given for both, though left was the worst)and advised elevation    AF/AAD hx AFlutter May 2023 Ablation 01/16/22 May 2023 Flecainide post Ablation with atypical AFlutter  Flecainide stopped June 2024 with development of RBBB and increased intervals   Past Medical History:  Diagnosis Date   Acute pain of right knee 05/16/2022   Atrial fibrillation (HCC)    Carotid artery disease (HCC)    Carotid US 09/01/2022: R 40-59, L 1-39   Colon polyps    COPD (chronic obstructive pulmonary disease) (HCC)    Former smoker 12/21/2015   Hyperlipemia    Kidney stones    Left carotid bruit 08/05/2021   Obesity    Paroxysmal atrial flutter (HCC) 05/29/2021   Sleep apnea treated with nocturnal bilevel positive airway pressure (BPAP)     Past Surgical History:  Procedure Laterality Date   A-FLUTTER ABLATION N/A 01/16/2022   Procedure: A-FLUTTER ABLATION;  Surgeon: Duke Salvia, MD;  Location[facility] INVASIVE CV LAB;  Service: Cardiovascular;  Laterality: N/A;   APPENDECTOMY     CARDIOVERSION N/A 05/31/2021   Procedure: CARDIOVERSION;  Surgeon: Chrystie Nose, MD;  Location: [facility] ENDOSCOPY;  Service: Cardiovascular;  Laterality: N/A;   CARDIOVERSION N/A 10/05/2021   Procedure: CARDIOVERSION;  Surgeon: Chrystie Nose, MD;  Location: [facility] ENDOSCOPY;  Service: Cardiovascular;  Laterality: N/A;   LEFT HEART CATH AND CORONARY ANGIOGRAPHY N/A 12/07/2021   Procedure: LEFT HEART CATH AND CORONARY ANGIOGRAPHY;  Surgeon: [name], MD;  Location: [facility] INVASIVE CV LAB;  Service: Cardiovascular;  Laterality: N/A;    LITHOTRIPSY     multiple   TEE WITHOUT CARDIOVERSION N/A 05/31/2021   Procedure: TRANSESOPHAGEAL ECHOCARDIOGRAM (TEE);  Surgeon: Chrystie Nose, MD;  Location: [facility] ENDOSCOPY;  Service: Cardiovascular;  Laterality: N/A;    Current Outpatient Medications  Medication Sig Dispense Refill   apixaban (ELIQUIS) 5 MG TABS tablet Take 1 tablet (5 mg total) by mouth 2 (two) times daily. 180 tablet 3   b complex vitamins capsule Take 1 capsule by mouth daily.     bisacodyl (DULCOLAX) 5 MG EC tablet Take 5 mg by mouth as needed.     cholecalciferol (VITAMIN D3) 25 MCG (1000 UNIT) tablet Take 1,000 Units by mouth daily.     diclofenac Sodium (VOLTAREN) 1 % GEL Apply 4 g topically 4 (four) times daily as needed. 100 g 3   diltiazem (CARDIZEM CD) 240 MG 24 hr capsule Take 1 capsule (240 mg total) by mouth daily. 30 capsule 3   furosemide (LASIX) 20 MG tablet Take 1 tablet (20 mg total)  by mouth daily as needed for edema. 90 tablet 3   indapamide (LOZOL) 2.5 MG tablet Take 2.5 mg by mouth daily.     Iron, Ferrous Sulfate, 325 (65 Fe) MG TABS Take 325 mg by mouth every other day.     Multiple Vitamin (MULTIVITAMIN) tablet Take 1 tablet by mouth daily.     nitroGLYCERIN (NITROSTAT) 0.4 MG SL tablet Place 1 tablet (0.4 mg total) under the tongue every 5 (five) minutes as needed for chest pain (Max 3 doses). 25 tablet 2   Omega-3 1000 MG CAPS Take 1,000 mg by mouth daily.     polyethylene glycol (MIRALAX / GLYCOLAX) 17 g packet Take 17 g by mouth daily.     rosuvastatin (CRESTOR) 20 MG tablet Take 1 tablet (20 mg total) by mouth daily. 90 tablet 3   tamsulosin (FLOMAX) 0.4 MG CAPS capsule Take 1 capsule by mouth daily.     triamcinolone cream (KENALOG) 0.1 % Apply 1 Application topically 2 (two) times daily. 30 g 0   No current facility-administered medications for this visit.    Allergies:   Lipitor [atorvastatin], Codeine, and Latex   Social History:  The patient  reports that he quit smoking about 3  years ago. His smoking use included cigarettes. He started smoking about 43 years ago. He has a 60 pack-year smoking history. He has been exposed to tobacco smoke. He has never used smokeless tobacco. He reports that he does not currently use alcohol. He reports that he does not use drugs.   Family History:  The patient's family history includes Aneurysm in his paternal uncle; Aortic stenosis in his father; Cancer in his paternal uncle; Colon cancer in his paternal uncle; Diabetes in his mother; Heart disease in his father; Hyperlipidemia in his father; Hypertension in his father; Ovarian cancer in his sister.  ROS:  Please see the history of present illness.    All other systems are reviewed and otherwise negative.   PHYSICAL EXAM:  VS:  There were no vitals taken for this visit. BMI: There is no height or weight on file to calculate BMI. Well nourished, well developed, in no acute distress HEENT: normocephalic, atraumatic Neck: no JVD, carotid bruits or masses Cardiac:  RRR; no significant murmurs, no rubs, or gallops Lungs: CTA b/l, no wheezing, rhonchi or rales Abd: soft, nontender MS: no deformity or atrophy Ext:  L>R edema compression hose LLE Skin: warm and dry, no rash Neuro:  No gross deficits appreciated Psych: euthymic mood, full affect   EKG:  done today and reviewed by myself AFlutter (probably atypical vs coarse Afib, NO RBBB, QRS is 86ms 06/26/23: AFlutter 96bpm, RBBB personally reviewed Personally reviewed previously  03/09/23: AFlutter 124bpm               AFlutter 105bpm (appears atypical)  July 2024: monitor Impression:   Predominant rhythm:  SR   44 to 114 bpm  Average HR 67 bpm   Occasional PACs (4.1% total)   Rare PACs.   3 short bursts of SVT, fastest at 5 beats for 146 bpm, longest for 7 beats at 99bpm    Triggered events corresponded to SR with PACs  03/02/22: ETT The ECG was negative for ischemia. No ST deviation was noted.   Exercise capacity was mildly  impaired. Patient exercised for 5 min and 0 sec. Maximum HR of 125 bpm. MPHR 81.0 %. Peak METS 7.0 .   Hypertensive response to exercise.    01/16/22: EPS/ablation Impression: #  1-normal sinus node function #2 abnormal atrial function manifested by sustained atrial flutter. Catheter ablation successfully eliminated the substrate for isthmus dependent flutter #3-normal AV nodal function #4-normal His-Purkinje system function #5-no evidence of accessory pathway #6-normal ventricular function as described above.  Pt had a variety of post catheter arrhythmia including different flutters. It was elected to porceed as his clinical rhythm had been typical atrial flutter and this had prompted repeated hospital therapies    12/06/21: TTE 1. Left ventricular ejection fraction, by estimation, is 65 to 70%. The  left ventricle has hyperdynamic function. The left ventricle has no  regional wall motion abnormalities. There is mild concentric left  ventricular hypertrophy. Left ventricular  diastolic function could not be evaluated.   2. Right ventricular systolic function was not well visualized. The right  ventricular size is normal. There is normal pulmonary artery systolic  pressure.   3. The mitral valve is normal in structure. Mild mitral valve  regurgitation. No evidence of mitral stenosis.   4. The aortic valve is normal in structure. Aortic valve regurgitation is  not visualized. No aortic stenosis is present.   5. The inferior vena cava is dilated in size with >50% respiratory  variability, suggesting right atrial pressure of 8 mmHg.    Recent Labs: 06/19/2023: ALT 14; BUN 27; Creatinine, Ser 1.05; Hemoglobin 14.5; Magnesium 1.8; NT-Pro BNP 181; Platelets 233.0; Potassium 3.6; Sodium 142; TSH 4.89  06/19/2023: Cholesterol 127; HDL 38.30; LDL Cholesterol 56; Total CHOL/HDL Ratio 3; Triglycerides 164.0; VLDL 32.8   Estimated Creatinine Clearance: 97.1 mL/min (by C-G formula based on SCr of  1.05 mg/dL).   Wt Readings from Last 3 Encounters:  06/26/23 290 lb 3.2 oz (131.6 kg)  06/19/23 290 lb 9.6 oz (131.8 kg)  06/12/23 290 lb 6.4 oz (131.7 kg)     Other studies reviewed: Additional studies/records reviewed today include: summarized above  ASSESSMENT AND PLAN:  AFlutter (atypical) CTI ablation 2023 CHA2DS2Vasc is one for age, maintained on Eliquis, appropriately dosed  Discussed rhythm management strategies Discussed Tikosyn as next AAD option for him In review of his phone, paroxysmal AFlutter By exam his rhythm sounded regular and his watch called it SR though by EKG he is in rate controlled flutter, atypical vs coarse AFib He is not inclined right now to go ahead with Tikosyn yet, though he did look into it and dofetilide is covered by his insurance He would like to get started with regular exercise, get some weight off, see how he is doing before making any changes/DCCV    2. Secondary hypercoagulable state  3. LE edema, skin changes Varicose veins noted at his last visit Seeing [facility] now   Disposition: will have him see Dr. Graciela Husbands in 5mo, sooner if needed, to discussed management strategies, sooner if needed     Current medicines are reviewed at length with the patient today.  The patient did not have any concerns regarding medicines.  Norma Fredrickson, PA-C 06/28/2023 1:36 PM     [facility] [address] [place] [address] [phone] (office)  [phone] (fax)

## 2023-06-29 ENCOUNTER — Ambulatory Visit: Payer: Medicare Other | Admitting: Physician Assistant

## 2023-06-29 VITALS — BP 124/75 | HR 73 | Temp 97.9°F | Resp 16 | Ht 73.0 in | Wt 293.0 lb

## 2023-06-29 DIAGNOSIS — M7989 Other specified soft tissue disorders: Secondary | ICD-10-CM

## 2023-06-29 DIAGNOSIS — I878 Other specified disorders of veins: Secondary | ICD-10-CM

## 2023-06-29 NOTE — Progress Notes (Signed)
Office Note     CC:  follow up Requesting Provider:  Garnette Gunner, MD  HPI: Richard Vazquez is a 67 y.o. (Feb 19, 1956) male who presents for evaluation of left lower extremity edema.  He is complaining of edema in his left lower extremity which has worsened over the past 3 months since retiring.  He admittedly has gained some weight and has not been as active since retiring.  He denies any history of DVT, venous ulcerations, trauma, or prior vascular intervention.  He has tried compression in the past however believes they did not fit appropriately.  He does not elevate his legs much during the day.  Admittedly he has been sedentary and sitting with his legs down.  He is not significantly bothered by the edema however does have occasional aching and pain especially at the end of the day.  He denies tobacco use.  He takes Eliquis for atrial fibrillation.   Past Medical History:  Diagnosis Date   Acute pain of right knee 05/16/2022   Atrial fibrillation (HCC)    Carotid artery disease (HCC)    Carotid US 09/01/2022: R 40-59, L 1-39   Colon polyps    COPD (chronic obstructive pulmonary disease) (HCC)    Former smoker 12/21/2015   Hyperlipemia    Kidney stones    Left carotid bruit 08/05/2021   Obesity    Paroxysmal atrial flutter (HCC) 05/29/2021   Sleep apnea treated with nocturnal bilevel positive airway pressure (BPAP)     Past Surgical History:  Procedure Laterality Date   A-FLUTTER ABLATION N/A 01/16/2022   Procedure: A-FLUTTER ABLATION;  Surgeon: Duke Salvia, MD;  Location: [facility] INVASIVE CV LAB;  Service: Cardiovascular;  Laterality: N/A;   APPENDECTOMY     CARDIOVERSION N/A 05/31/2021   Procedure: CARDIOVERSION;  Surgeon: Chrystie Nose, MD;  Location: [facility] ENDOSCOPY;  Service: Cardiovascular;  Laterality: N/A;   CARDIOVERSION N/A 10/05/2021   Procedure: CARDIOVERSION;  Surgeon: Chrystie Nose, MD;  Location: [facility] ENDOSCOPY;  Service: Cardiovascular;  Laterality: N/A;   LEFT  HEART CATH AND CORONARY ANGIOGRAPHY N/A 12/07/2021   Procedure: LEFT HEART CATH AND CORONARY ANGIOGRAPHY;  Surgeon: [name], MD;  Location: [facility] INVASIVE CV LAB;  Service: Cardiovascular;  Laterality: N/A;   LITHOTRIPSY     multiple   TEE WITHOUT CARDIOVERSION N/A 05/31/2021   Procedure: TRANSESOPHAGEAL ECHOCARDIOGRAM (TEE);  Surgeon: Chrystie Nose, MD;  Location: [facility] ENDOSCOPY;  Service: Cardiovascular;  Laterality: N/A;    Social History   Socioeconomic History   Marital status: Married    Spouse name: Not on file   Number of children: 2   Years of education: Not on file   Highest education level: 12th grade  Occupational History   Occupation: retired  Tobacco Use   Smoking status: Former    Current packs/day: 0.00    Average packs/day: 1.5 packs/day for 40.0 years (60.0 ttl pk-yrs)    Types: Cigarettes    Start date: 12/09/1979    Quit date: 12/09/2019    Years since quitting: 3.5    Passive exposure: Past   Smokeless tobacco: Never  Vaping Use   Vaping status: Never Used  Substance and Sexual Activity   Alcohol use: Not Currently   Drug use: Never   Sexual activity: Not Currently  Other Topics Concern   Not on file  Social History Narrative   Not on file   Social Determinants of Health   Financial Resource Strain: Low Risk  (06/15/2023)  Overall Financial Resource Strain (CARDIA)    Difficulty of Paying Living Expenses: Not very hard  Food Insecurity: No Food Insecurity (06/15/2023)   Hunger Vital Sign    Worried About Running Out of Food in the Last Year: Never true    Ran Out of Food in the Last Year: Never true  Transportation Needs: No Transportation Needs (06/15/2023)   PRAPARE - Administrator, Civil Service (Medical): No    Lack of Transportation (Non-Medical): No  Physical Activity: Unknown (06/15/2023)   Exercise Vital Sign    Days of Exercise per Week: Patient declined    Minutes of Exercise per Session: 10 min  Recent Concern:  Physical Activity - Insufficiently Active (04/14/2023)   Exercise Vital Sign    Days of Exercise per Week: 4 days    Minutes of Exercise per Session: 10 min  Stress: No Stress Concern Present (06/15/2023)   [employer] of Occupational Health - Occupational Stress Questionnaire    Feeling of Stress : Only a little  Social Connections: Unknown (06/15/2023)   Social Connection and Isolation Panel [NHANES]    Frequency of Communication with Friends and Family: Twice a week    Frequency of Social Gatherings with Friends and Family: Patient declined    Attends Religious Services: Never    Database administrator or Organizations: No    Attends Engineer, structural: Patient declined    Marital Status: Married  Catering manager Violence: Not At Risk (04/16/2023)   Humiliation, Afraid, Rape, and Kick questionnaire    Fear of Current or Ex-Partner: No    Emotionally Abused: No    Physically Abused: No    Sexually Abused: No    Family History  Problem Relation Age of Onset   Diabetes Mother    Heart disease Father        smoker   Aortic stenosis Father    Hyperlipidemia Father    Hypertension Father    Ovarian cancer Sister    Colon cancer Paternal Uncle    Aneurysm Paternal Uncle        Brain   Cancer Paternal Uncle        all in abdomin, ? colon cancer    Current Outpatient Medications  Medication Sig Dispense Refill   apixaban (ELIQUIS) 5 MG TABS tablet Take 1 tablet (5 mg total) by mouth 2 (two) times daily. 180 tablet 3   b complex vitamins capsule Take 1 capsule by mouth daily.     bisacodyl (DULCOLAX) 5 MG EC tablet Take 5 mg by mouth as needed.     cholecalciferol (VITAMIN D3) 25 MCG (1000 UNIT) tablet Take 1,000 Units by mouth daily.     diclofenac Sodium (VOLTAREN) 1 % GEL Apply 4 g topically 4 (four) times daily as needed. 100 g 3   diltiazem (CARDIZEM CD) 240 MG 24 hr capsule Take 1 capsule (240 mg total) by mouth daily. 30 capsule 3   furosemide (LASIX) 20  MG tablet Take 1 tablet (20 mg total) by mouth daily as needed for edema. 90 tablet 3   indapamide (LOZOL) 2.5 MG tablet Take 2.5 mg by mouth daily.     Iron, Ferrous Sulfate, 325 (65 Fe) MG TABS Take 325 mg by mouth every other day.     Multiple Vitamin (MULTIVITAMIN) tablet Take 1 tablet by mouth daily.     nitroGLYCERIN (NITROSTAT) 0.4 MG SL tablet Place 1 tablet (0.4 mg total) under the tongue every 5 (five)  minutes as needed for chest pain (Max 3 doses). 25 tablet 2   Omega-3 1000 MG CAPS Take 1,000 mg by mouth daily.     polyethylene glycol (MIRALAX / GLYCOLAX) 17 g packet Take 17 g by mouth daily.     tamsulosin (FLOMAX) 0.4 MG CAPS capsule Take 1 capsule by mouth daily.     rosuvastatin (CRESTOR) 20 MG tablet Take 1 tablet (20 mg total) by mouth daily. 90 tablet 3   triamcinolone cream (KENALOG) 0.1 % Apply 1 Application topically 2 (two) times daily. 30 g 0   No current facility-administered medications for this visit.    Allergies  Allergen Reactions   Lipitor [Atorvastatin] Other (See Comments)    Myalgias   Codeine Itching   Latex Itching     REVIEW OF SYSTEMS:   [X]  denotes positive finding, [ ]  denotes negative finding Cardiac  Comments:  Chest pain or chest pressure:    Shortness of breath upon exertion:    Short of breath when lying flat:    Irregular heart rhythm:        Vascular    Pain in calf, thigh, or hip brought on by ambulation:    Pain in feet at night that wakes you up from your sleep:     Blood clot in your veins:    Leg swelling:         Pulmonary    Oxygen at home:    Productive cough:     Wheezing:         Neurologic    Sudden weakness in arms or legs:     Sudden numbness in arms or legs:     Sudden onset of difficulty speaking or slurred speech:    Temporary loss of vision in one eye:     Problems with dizziness:         Gastrointestinal    Blood in stool:     Vomited blood:         Genitourinary    Burning when urinating:      Blood in urine:        Psychiatric    Major depression:         Hematologic    Bleeding problems:    Problems with blood clotting too easily:        Skin    Rashes or ulcers:        Constitutional    Fever or chills:      PHYSICAL EXAMINATION:  Vitals:   06/29/23 1057  BP: 124/75  Pulse: 73  Resp: 16  Temp: 97.9 F (36.6 C)  TempSrc: Temporal  SpO2: 93%  Weight: 293 lb (132.9 kg)  Height: 6\' 1"  (1.854 m)    General:  WDWN in NAD; vital signs documented above Gait: Not observed HENT: WNL, normocephalic Pulmonary: normal non-labored breathing , without Rales, rhonchi,  wheezing Cardiac: regular HR Abdomen: soft, NT, no masses Skin: without rashes Extremities: without ischemic changes, without Gangrene , without cellulitis; without open wounds; minor pigmentation changes medial lower leg; varicosities upper medial lower leg Musculoskeletal: no muscle wasting or atrophy  Neurologic: A&O X 3 Psychiatric:  The pt has Normal affect.    Non-Invasive Vascular Imaging:   Left lower extremity venous reflux study negative for DVT Left common femoral vein reflux Reflux at the saphenofemoral junction of the GSV Incompetent small saphenous vein   ASSESSMENT/PLAN:: 67 y.o. male here for evaluation of left lower extremity edema and varicose veins  Edema and varicosity of the left lower extremity largely do not bother the patient however he was interested in further evaluation.  Left lower extremity venous reflux study was negative for DVT.  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  He however has not worn compression regularly, focused on elevation of the legs, or avoid prolonged sitting and standing in the past.  Admittedly he has also gained some weight since retiring 3 months ago and has been largely sedentary.  He plans on being more active and has ordered an exercise bike.  Recommendations include 15 to 20 mmHg  compression socks to be worn on a regular basis.  We also discussed proper leg elevation which should be performed for 15 to 20 minutes throughout the day.  He will also focus on avoiding prolonged sitting and standing.  He has no interest in any vein procedures at this time.  Should the above recommendations not help with his symptoms he will notify our office.  For now he will follow-up on an as-needed basis.   Emilie Rutter, PA-C [facility] [phone]  Clinic MD:   Hetty Blend

## 2023-07-02 ENCOUNTER — Encounter: Payer: Self-pay | Admitting: Physician Assistant

## 2023-07-02 ENCOUNTER — Other Ambulatory Visit: Payer: Self-pay

## 2023-07-02 ENCOUNTER — Ambulatory Visit: Payer: Medicare Other | Attending: Physician Assistant | Admitting: Physician Assistant

## 2023-07-02 VITALS — BP 120/74 | HR 68 | Ht 73.0 in | Wt 292.0 lb

## 2023-07-02 DIAGNOSIS — D6869 Other thrombophilia: Secondary | ICD-10-CM | POA: Diagnosis not present

## 2023-07-02 DIAGNOSIS — I878 Other specified disorders of veins: Secondary | ICD-10-CM

## 2023-07-02 DIAGNOSIS — I4510 Unspecified right bundle-branch block: Secondary | ICD-10-CM

## 2023-07-02 DIAGNOSIS — H524 Presbyopia: Secondary | ICD-10-CM | POA: Diagnosis not present

## 2023-07-02 DIAGNOSIS — I484 Atypical atrial flutter: Secondary | ICD-10-CM

## 2023-07-02 DIAGNOSIS — M7989 Other specified soft tissue disorders: Secondary | ICD-10-CM

## 2023-07-02 LAB — HM DIABETES EYE EXAM

## 2023-07-02 MED ORDER — FUROSEMIDE 20 MG PO TABS
20.0000 mg | ORAL_TABLET | Freq: Two times a day (BID) | ORAL | Status: DC
Start: 2023-07-02 — End: 2023-10-30

## 2023-07-02 NOTE — Patient Instructions (Addendum)
Medication Instructions:  NO CHANGES *If you need a refill on your cardiac medications before your next appointment, please call your pharmacy*   Lab Work: NONE If you have labs (blood work) drawn today and your tests are completely normal, you will receive your results only by: MyChart Message (if you have MyChart) OR A paper copy in the mail If you have any lab test that is abnormal or we need to change your treatment, we will call you to review the results.   Testing/Procedures: NONE   Follow-Up: At [facility], you and your health needs are our priority.  As part of our continuing mission to provide you with exceptional heart care, we have created designated Provider Care Teams.  These Care Teams include your primary Cardiologist (physician) and Advanced Practice Providers (APPs -  Physician Assistants and Nurse Practitioners) who all work together to provide you with the care you need, when you need it.  We recommend signing up for the patient portal called "MyChart".  Sign up information is provided on this After Visit Summary.  MyChart is used to connect with patients for Virtual Visits (Telemedicine).  Patients are able to view lab/test results, encounter notes, upcoming appointments, etc.  Non-urgent messages can be sent to your provider as well.   To learn more about what you can do with MyChart, go to [email or web address].    Your next appointment:  [facility] IN 1 MONTH AND 4 month(s)  Provider:   DR Graciela Husbands   Other Instructions NONE

## 2023-07-09 ENCOUNTER — Ambulatory Visit (HOSPITAL_COMMUNITY): Payer: Medicare Other | Admitting: Physician Assistant

## 2023-08-08 ENCOUNTER — Ambulatory Visit (HOSPITAL_COMMUNITY): Payer: Medicare Other | Admitting: Physician Assistant

## 2023-08-10 ENCOUNTER — Other Ambulatory Visit: Payer: Self-pay | Admitting: Internal Medicine

## 2023-08-10 ENCOUNTER — Encounter: Payer: Self-pay | Admitting: Internal Medicine

## 2023-08-15 ENCOUNTER — Encounter: Payer: Self-pay | Admitting: Internal Medicine

## 2023-08-15 ENCOUNTER — Other Ambulatory Visit: Payer: Self-pay | Admitting: Internal Medicine

## 2023-08-27 ENCOUNTER — Encounter: Payer: Self-pay | Admitting: Internal Medicine

## 2023-08-27 ENCOUNTER — Ambulatory Visit: Payer: Medicare Other | Admitting: Internal Medicine

## 2023-08-27 VITALS — BP 102/63 | HR 109 | Temp 97.9°F | Resp 11 | Ht 73.0 in | Wt 282.0 lb

## 2023-08-27 DIAGNOSIS — D122 Benign neoplasm of ascending colon: Secondary | ICD-10-CM

## 2023-08-27 DIAGNOSIS — K648 Other hemorrhoids: Secondary | ICD-10-CM

## 2023-08-27 DIAGNOSIS — Z1211 Encounter for screening for malignant neoplasm of colon: Secondary | ICD-10-CM

## 2023-08-27 DIAGNOSIS — K5140 Inflammatory polyps of colon without complications: Secondary | ICD-10-CM

## 2023-08-27 DIAGNOSIS — K625 Hemorrhage of anus and rectum: Secondary | ICD-10-CM | POA: Diagnosis not present

## 2023-08-27 DIAGNOSIS — K5730 Diverticulosis of large intestine without perforation or abscess without bleeding: Secondary | ICD-10-CM | POA: Diagnosis not present

## 2023-08-27 DIAGNOSIS — K635 Polyp of colon: Secondary | ICD-10-CM

## 2023-08-27 DIAGNOSIS — D125 Benign neoplasm of sigmoid colon: Secondary | ICD-10-CM

## 2023-08-27 DIAGNOSIS — D120 Benign neoplasm of cecum: Secondary | ICD-10-CM

## 2023-08-27 DIAGNOSIS — D123 Benign neoplasm of transverse colon: Secondary | ICD-10-CM

## 2023-08-27 DIAGNOSIS — D124 Benign neoplasm of descending colon: Secondary | ICD-10-CM

## 2023-08-27 MED ORDER — SODIUM CHLORIDE 0.9 % IV SOLN: 500.0000 mL | Freq: Once | INTRAVENOUS | Status: DC

## 2023-08-27 NOTE — Progress Notes (Signed)
Called to room to assist during endoscopic procedure.  Patient ID and intended procedure confirmed with present staff. Received instructions for my participation in the procedure from the performing physician.  

## 2023-08-27 NOTE — Progress Notes (Signed)
Vss nad trans to pacu 

## 2023-08-27 NOTE — Op Note (Signed)
[facility] Patient Name: Richard Vazquez Procedure Date: 08/27/2023 2:53 PM MRN: [id number] Endoscopist: Beverley Fiedler , MD, [id number] Age: 67 Referring MD:  Date of Birth: 1956/03/01 Gender: Male Account #: [id number] Procedure:                Colonoscopy Indications:              High risk colon cancer surveillance: Personal                            history of colonic polyps, Last colonoscopy > 10                            years ago Medicines:                Monitored Anesthesia Care Procedure:                Pre-Anesthesia Assessment:                           - Prior to the procedure, a History and Physical                            was performed, and patient medications and                            allergies were reviewed. The patient's tolerance of                            previous anesthesia was also reviewed. The risks                            and benefits of the procedure and the sedation                            options and risks were discussed with the patient.                            All questions were answered, and informed consent                            was obtained. Prior Anticoagulants: The patient has                            taken Eliquis (apixaban), last dose was 2 days                            prior to procedure. ASA Grade Assessment: III - A                            patient with severe systemic disease. After                            reviewing the risks and benefits, the patient was  deemed in satisfactory condition to undergo the                            procedure.                           After obtaining informed consent, the colonoscope                            was passed under direct vision. Throughout the                            procedure, the patient's blood pressure, pulse, and                            oxygen saturations were monitored continuously. The                            Olympus  CF-HQ190L ([id number]) Colonoscope was                            introduced through the anus and advanced to the                            cecum, identified by appendiceal orifice and                            ileocecal valve. The colonoscopy was performed                            without difficulty. The patient tolerated the                            procedure well. The quality of the bowel                            preparation was good. The ileocecal valve,                            appendiceal orifice, and rectum were photographed. Scope In: 2:59:15 PM Scope Out: 3:24:14 PM Scope Withdrawal Time: 0 hours 21 minutes 29 seconds  Total Procedure Duration: 0 hours 24 minutes 59 seconds  Findings:                 The digital rectal exam was normal.                           A 5 mm polyp was found in the cecum. The polyp was                            sessile. The polyp was removed with a cold snare.                            Resection and retrieval were complete.  Three sessile polyps were found in the ascending                            colon. The polyps were 5 to 8 mm in size. These                            polyps were removed with a cold snare. Resection                            and retrieval were complete.                           Three sessile polyps were found in the transverse                            colon. The polyps were 5 to 7 mm in size. These                            polyps were removed with a cold snare. Resection                            and retrieval were complete.                           A 6 mm polyp was found in the descending colon. The                            polyp was sessile. The polyp was removed with a                            cold snare. Resection and retrieval were complete.                           Four sessile polyps were found in the sigmoid                            colon. The polyps were 5 to 8 mm in size.  These                            polyps were removed with a cold snare. Resection                            and retrieval were complete.                           A few small-mouthed diverticula were found in the                            descending colon.                           Internal hemorrhoids were found during  retroflexion. The hemorrhoids were small. Complications:            No immediate complications. Estimated Blood Loss:     Estimated blood loss was minimal. Impression:               - One 5 mm polyp in the cecum, removed with a cold                            snare. Resected and retrieved.                           - Three 5 to 8 mm polyps in the ascending colon,                            removed with a cold snare. Resected and retrieved.                           - Three 5 to 7 mm polyps in the transverse colon,                            removed with a cold snare. Resected and retrieved.                           - One 6 mm polyp in the descending colon, removed                            with a cold snare. Resected and retrieved.                           - Four 5 to 8 mm polyps in the sigmoid colon,                            removed with a cold snare. Resected and retrieved.                           - Mild diverticulosis in the descending colon.                           - Internal hemorrhoids. Recommendation:           - Patient has a contact number available for                            emergencies. The signs and symptoms of potential                            delayed complications were discussed with the                            patient. Return to normal activities tomorrow.                            Written discharge instructions were provided to the  patient.                           - Resume previous diet.                           - Continue present medications.                           - Await  pathology results.                           - Repeat colonoscopy date to be determined after                            pending pathology results are reviewed for                            surveillance.                           - Resume Eliquis (apixaban) at prior dose tomorrow.                            Refer to managing physician for further adjustment                            of therapy. Beverley Fiedler, MD 08/27/2023 3:28:41 PM This report has been signed electronically.

## 2023-08-27 NOTE — Patient Instructions (Signed)
   RESUME ELIQUIS AT PRIOR DOSAGE TOMORROW ( 08/28/23 )  Continue previous diet & medications  Handouts on polyps,diverticulosis,& hemorrhoids given to you today.   Await pathology results on polyps removed     YOU HAD AN ENDOSCOPIC PROCEDURE TODAY AT THE [facility]:   Refer to the procedure report that was given to you for any specific questions about what was found during the examination.  If the procedure report does not answer your questions, please call your gastroenterologist to clarify.  If you requested that your care partner not be given the details of your procedure findings, then the procedure report has been included in a sealed envelope for you to review at your convenience later.  YOU SHOULD EXPECT: Some feelings of bloating in the abdomen. Passage of more gas than usual.  Walking can help get rid of the air that was put into your GI tract during the procedure and reduce the bloating. If you had a lower endoscopy (such as a colonoscopy or flexible sigmoidoscopy) you may notice spotting of blood in your stool or on the toilet paper. If you underwent a bowel prep for your procedure, you may not have a normal bowel movement for a few days.  Please Note:  You might notice some irritation and congestion in your nose or some drainage.  This is from the oxygen used during your procedure.  There is no need for concern and it should clear up in a day or so.  SYMPTOMS TO REPORT IMMEDIATELY:  Following lower endoscopy (colonoscopy or flexible sigmoidoscopy):  Excessive amounts of blood in the stool  Significant tenderness or worsening of abdominal pains  Swelling of the abdomen that is new, acute  Fever of 100F or higher  For urgent or emergent issues, a gastroenterologist can be reached at any hour by calling [phone]. Do not use MyChart messaging for urgent concerns.    DIET:  We do recommend a small meal at first, but then you may proceed to your regular diet.   Drink plenty of fluids but you should avoid alcoholic beverages for 24 hours.  ACTIVITY:  You should plan to take it easy for the rest of today and you should NOT DRIVE or use heavy machinery until tomorrow (because of the sedation medicines used during the test).    FOLLOW UP: Our staff will call the number listed on your records the next business day following your procedure.  We will call around 7:15- 8:00 am to check on you and address any questions or concerns that you may have regarding the information given to you following your procedure. If we do not reach you, we will leave a message.     If any biopsies were taken you will be contacted by phone or by letter within the next 1-3 weeks.  Please call us at [phone] if you have not heard about the biopsies in 3 weeks.    SIGNATURES/CONFIDENTIALITY: You and/or your care partner have signed paperwork which will be entered into your electronic medical record.  These signatures attest to the fact that that the information above on your After Visit Summary has been reviewed and is understood.  Full responsibility of the confidentiality of this discharge information lies with you and/or your care-partner.

## 2023-08-28 ENCOUNTER — Telehealth: Payer: Self-pay | Admitting: *Deleted

## 2023-08-28 NOTE — Telephone Encounter (Signed)
  Follow up Call-     08/27/2023    2:34 PM  Call back number  Post procedure Call Back phone  # [phone]  Permission to leave phone message Yes     Patient questions:   Message left to call us if necessary.

## 2023-09-03 ENCOUNTER — Encounter: Payer: Self-pay | Admitting: Internal Medicine

## 2023-09-03 ENCOUNTER — Encounter (HOSPITAL_COMMUNITY): Payer: Self-pay

## 2023-09-03 ENCOUNTER — Ambulatory Visit (HOSPITAL_COMMUNITY)
Admission: RE | Admit: 2023-09-03 | Payer: Medicare Other | Source: Ambulatory Visit | Attending: Physician Assistant | Admitting: Physician Assistant

## 2023-09-03 ENCOUNTER — Telehealth: Payer: Self-pay | Admitting: Internal Medicine

## 2023-09-03 DIAGNOSIS — I779 Disorder of arteries and arterioles, unspecified: Secondary | ICD-10-CM

## 2023-09-03 LAB — SURGICAL PATHOLOGY

## 2023-09-03 NOTE — Telephone Encounter (Signed)
 Pt called in to cancel carotid doppler today due to weather. Pt needs a new order since his expired 09/05/23 and there is no availability before then.

## 2023-09-03 NOTE — Telephone Encounter (Signed)
 Pt advised.... order has been updated.

## 2023-09-13 DIAGNOSIS — G4733 Obstructive sleep apnea (adult) (pediatric): Secondary | ICD-10-CM | POA: Diagnosis not present

## 2023-09-17 ENCOUNTER — Other Ambulatory Visit: Payer: Self-pay | Admitting: Internal Medicine

## 2023-09-17 DIAGNOSIS — I4819 Other persistent atrial fibrillation: Secondary | ICD-10-CM

## 2023-09-17 NOTE — Telephone Encounter (Signed)
Prescription refill request for Eliquis received. Indication:aflutter Last office visit:11/24 Scr:1.05  10/24 Age: 68 Weight:127.9  kg  Prescription refilled

## 2023-09-26 ENCOUNTER — Ambulatory Visit (HOSPITAL_COMMUNITY)
Admission: RE | Admit: 2023-09-26 | Discharge: 2023-09-26 | Disposition: A | Discharge status: Home / Self Care | Payer: Medicare Other | Source: Ambulatory Visit | Attending: Cardiovascular Disease | Admitting: Cardiovascular Disease

## 2023-09-26 DIAGNOSIS — I779 Disorder of arteries and arterioles, unspecified: Secondary | ICD-10-CM | POA: Diagnosis not present

## 2023-09-27 ENCOUNTER — Telehealth: Payer: Self-pay

## 2023-09-27 NOTE — Telephone Encounter (Signed)
Copied from CRM [id number]. Topic: General - Other >> Sep 27, 2023  9:37 AM Elizebeth Brooking wrote: Reason for CRM: Val from [employer] called in regarding the request for prescription for CPAP Supplies, she stated she will resend it through fax once she verified the fax number

## 2023-09-27 NOTE — Telephone Encounter (Signed)
Noted. Will keep an eye out for fax.

## 2023-10-14 DIAGNOSIS — G4733 Obstructive sleep apnea (adult) (pediatric): Secondary | ICD-10-CM | POA: Diagnosis not present

## 2023-10-16 DIAGNOSIS — N400 Enlarged prostate without lower urinary tract symptoms: Secondary | ICD-10-CM | POA: Diagnosis not present

## 2023-10-16 DIAGNOSIS — N200 Calculus of kidney: Secondary | ICD-10-CM | POA: Diagnosis not present

## 2023-10-30 ENCOUNTER — Encounter: Payer: Self-pay | Admitting: Internal Medicine

## 2023-10-30 ENCOUNTER — Ambulatory Visit: Payer: Medicare Other | Attending: Internal Medicine | Admitting: Internal Medicine

## 2023-10-30 VITALS — BP 126/72 | HR 103 | Ht 75.0 in | Wt 309.4 lb

## 2023-10-30 DIAGNOSIS — I509 Heart failure, unspecified: Secondary | ICD-10-CM

## 2023-10-30 DIAGNOSIS — Z79899 Other long term (current) drug therapy: Secondary | ICD-10-CM | POA: Diagnosis not present

## 2023-10-30 DIAGNOSIS — I484 Atypical atrial flutter: Secondary | ICD-10-CM

## 2023-10-30 MED ORDER — DRONEDARONE HCL 400 MG PO TABS: 400.0000 mg | ORAL_TABLET | Freq: Two times a day (BID) | ORAL | 6 refills | Status: DC

## 2023-10-30 MED ORDER — POTASSIUM CHLORIDE CRYS ER 20 MEQ PO TBCR: EXTENDED_RELEASE_TABLET | ORAL | 1 refills | Status: DC

## 2023-10-30 MED ORDER — TORSEMIDE 20 MG PO TABS: 40.0000 mg | ORAL_TABLET | ORAL | 1 refills | Status: DC

## 2023-10-30 NOTE — Patient Instructions (Signed)
 Medication Instructions:  Your physician has recommended you make the following change in your medication:   ** Stop Furosemide  ** Begin Torsemide 20mg  - 2 tablets by mouth daily x 5 days then decrease to 2 tablets by mouth every other day.  ** Begin Potassium - 1 tablet by mouth on the days you take Torsemide  *If you need a refill on your cardiac medications before your next appointment, please call your pharmacy*   Lab Work: BMET in 2 weeks  If you have labs (blood work) drawn today and your tests are completely normal, you will receive your results only by: MyChart Message (if you have MyChart) OR A paper copy in the mail If you have any lab test that is abnormal or we need to change your treatment, we will call you to review the results.   Testing/Procedures: DCCV  Your physician has recommended that you have a Cardioversion (DCCV). Electrical Cardioversion uses a jolt of electricity to your heart either through paddles or wired patches attached to your chest. This is a controlled, usually prescheduled, procedure. Defibrillation is done under light anesthesia in the hospital, and you usually go home the day of the procedure. This is done to get your heart back into a normal rhythm. You are not awake for the procedure. Please see the instruction sheet given to you today.  Your physician has requested that you have an echocardiogram. Echocardiography is a painless test that uses sound waves to create images of your heart. It provides your doctor with information about the size and shape of your heart and how well your heart's chambers and valves are working. This procedure takes approximately one hour. There are no restrictions for this procedure. Please do NOT wear cologne, perfume, aftershave, or lotions (deodorant is allowed). Please arrive 15 minutes prior to your appointment time.  Please note: We ask at that you not bring children with you during ultrasound (echo/  vascular) testing. Due to room size and safety concerns, children are not allowed in the ultrasound rooms during exams. Our front office staff cannot provide observation of children in our lobby area while testing is being conducted. An adult accompanying a patient to their appointment will only be allowed in the ultrasound room at the discretion of the ultrasound technician under special circumstances. We apologize for any inconvenience.'   Follow-Up: At [facility], you and your health needs are our priority.  As part of our continuing mission to provide you with exceptional heart care, we have created designated Provider Care Teams.  These Care Teams include your primary Cardiologist (physician) and Advanced Practice Providers (APPs -  Physician Assistants and Nurse Practitioners) who all work together to provide you with the care you need, when you need it.  We recommend signing up for the patient portal called "MyChart".  Sign up information is provided on this After Visit Summary.  MyChart is used to connect with patients for Virtual Visits (Telemedicine).  Patients are able to view lab/test results, encounter notes, upcoming appointments, etc.  Non-urgent messages can be sent to your provider as well.   To learn more about what you can do with MyChart, go to [email or web address].    Your next appointment:   Appointment with  Dr Nelly Laurence to discuss ablation  ----------------------------------------------------------------------------     Dear Richard Vazquez  You are scheduled for a Cardioversion on Thursday, March 6 with Dr. Izora Ribas.  Please arrive at the [facility] (Main Entrance A) at [facility]  Hospital: [address] [place], [place] [address] at 6:30 AM (This time is 1 hour(s) before your procedure to ensure your preparation). Procedure is scheduled to begin at 7:30 AM.   Free valet parking service is available. You will check in at ADMITTING.   *Please Note: You will  receive a call the day before your procedure to confirm the appointment time. That time may have changed from the original time based on the schedule for that day.*   DIET:  Nothing to eat or drink after midnight except a sip of water with medications (see medication instructions below)   Continue taking your anticoagulant (blood thinner): Apixaban (Eliquis).  You will need to continue this after your procedure until you are told by your provider that it is safe to stop.     LABS: Labs completed at office visit 10/30/23 (BMP and CBC).   FYI:  For your safety, and to allow [place] to monitor your vital signs accurately during the surgery/procedure we request: If you have artificial nails, gel coating, SNS etc, please have those removed prior to your surgery/procedure. Not having the nail coverings /polish removed may result in cancellation or delay of your surgery/procedure.  Your support person will be asked to wait in the waiting room during your procedure.  It is OK to have someone drop you off and come back when you are ready to be discharged.  You cannot drive after the procedure and will need someone to drive you home.  Bring your insurance cards.  *Special Note: Every effort is made to have your procedure done on time. Occasionally there are emergencies that occur at the hospital that may cause delays. Please be patient if a delay does occur.

## 2023-10-30 NOTE — Progress Notes (Signed)
 Patient Care Team: Garnette Gunner, MD as PCP - General (Family Medicine) Pricilla Riffle, MD as PCP - Cardiology (Cardiology) Beatrice Lecher, PA-C as Physician Assistant (Cardiology)   HPI  Richard Vazquez is a 68 y.o. male seen in follow-up for atrial flutter for which  he underwent electrophysiological testing and catheter ablation 5/23; there were multiple circuits identified but there was a stable counterclockwise circuit consistent with electrocardiograms which have been recorded during his hospitalizations.  It was elected to ablate his cavotricuspid isthmus; unfortunately, within a couple of weeks he had recurrence of a flutter.  Flecainide was initiated.  He underwent cardioversion and sustained sinus   Cardioversion at [facility] with rapid reversion  Atrial flutter was noted to be recurrent in November 2024; he elected to defer cardioversion stopped flecainide  Complaining of increasing dyspnea on exertion.  Peripheral edema with chronic swelling of his lower extremities bilaterally left greater than right but probably worse   venous evaluation showed no DVT    DATE TEST EF    10/22 Echo   >75% % Hyperdynamic with mild LVH                      Date Cr K Hgb  3/23 1.03 4.0 14.8              DATE PR interval QRSduration PQRS Dose  5/23  218 92 310 0  6/23 224 96 320 100    3/25 NA 96      Thromboembolic risk factors ( age -59, HTN-1, Vasc disease -1 (carotid)) for a CHADSVASc Score of >=3 Records and Results Reviewed   Past Medical History:  Diagnosis Date   Acute pain of right knee 05/16/2022   Atrial fibrillation (HCC)    Carotid artery disease (HCC)    Carotid US 09/01/2022: R 40-59, L 1-39   Colon polyps    COPD (chronic obstructive pulmonary disease) (HCC)    Former smoker 12/21/2015   Hyperlipemia    Kidney stones    Left carotid bruit 08/05/2021   Obesity    Paroxysmal atrial flutter (HCC) 05/29/2021   Sleep apnea    bipap   Sleep  apnea treated with nocturnal bilevel positive airway pressure (BPAP)     Past Surgical History:  Procedure Laterality Date   A-FLUTTER ABLATION N/A 01/16/2022   Procedure: A-FLUTTER ABLATION;  Surgeon: Duke Salvia, MD;  Location: [facility] INVASIVE CV LAB;  Service: Cardiovascular;  Laterality: N/A;   APPENDECTOMY     CARDIOVERSION N/A 05/31/2021   Procedure: CARDIOVERSION;  Surgeon: Chrystie Nose, MD;  Location: [facility] ENDOSCOPY;  Service: Cardiovascular;  Laterality: N/A;   CARDIOVERSION N/A 10/05/2021   Procedure: CARDIOVERSION;  Surgeon: Chrystie Nose, MD;  Location: [facility] ENDOSCOPY;  Service: Cardiovascular;  Laterality: N/A;   COLONOSCOPY     LEFT HEART CATH AND CORONARY ANGIOGRAPHY N/A 12/07/2021   Procedure: LEFT HEART CATH AND CORONARY ANGIOGRAPHY;  Surgeon: [name], MD;  Location: [facility] INVASIVE CV LAB;  Service: Cardiovascular;  Laterality: N/A;   LITHOTRIPSY     multiple   TEE WITHOUT CARDIOVERSION N/A 05/31/2021   Procedure: TRANSESOPHAGEAL ECHOCARDIOGRAM (TEE);  Surgeon: Chrystie Nose, MD;  Location: [facility] ENDOSCOPY;  Service: Cardiovascular;  Laterality: N/A;    Current Meds  Medication Sig   bisacodyl (DULCOLAX) 5 MG EC tablet Take 5 mg by mouth as needed.   cholecalciferol (VITAMIN D3) 25 MCG (1000 UNIT)  tablet Take 1,000 Units by mouth daily.   diclofenac Sodium (VOLTAREN) 1 % GEL Apply 4 g topically 4 (four) times daily as needed.   diltiazem (CARDIZEM CD) 240 MG 24 hr capsule TAKE 1 CAPSULE BY MOUTH DAILY   ELIQUIS 5 MG TABS tablet TAKE 1 TABLET BY MOUTH TWICE A DAY   furosemide (LASIX) 20 MG tablet Take 1 tablet (20 mg total) by mouth 2 (two) times daily.   indapamide (LOZOL) 2.5 MG tablet Take 2.5 mg by mouth daily.   Iron, Ferrous Sulfate, 325 (65 Fe) MG TABS Take 325 mg by mouth every other day.   Multiple Vitamin (MULTIVITAMIN) tablet Take 1 tablet by mouth daily.   nitroGLYCERIN (NITROSTAT) 0.4 MG SL tablet Place 1 tablet (0.4 mg total) under the tongue  every 5 (five) minutes as needed for chest pain (Max 3 doses).   Omega-3 1000 MG CAPS Take 1,000 mg by mouth daily.   polyethylene glycol (MIRALAX / GLYCOLAX) 17 g packet Take 17 g by mouth daily.   rosuvastatin (CRESTOR) 20 MG tablet TAKE 1 TABLET BY MOUTH DAILY   tamsulosin (FLOMAX) 0.4 MG CAPS capsule Take 1 capsule by mouth daily.   triamcinolone cream (KENALOG) 0.1 % Apply 1 Application topically 2 (two) times daily.    Allergies  Allergen Reactions   Codeine Itching   Latex Itching   Lipitor [Atorvastatin] Other (See Comments)    Myalgias      Review of Systems negative except from HPI and PMH  Physical Exam BP 126/72   Pulse (!) 103   Ht 6\' 3"  (1.905 m)   Wt (!) 309 lb 6.4 oz (140.3 kg)   SpO2 92%   BMI 38.67 kg/m  Well developed and Morbidly obese in no acute distress HENT normal Neck supple with JVP-  8-10 positive HJ  clear Rapid and regular rate and rhythm, no murmurs or gallops Abd-soft with active BS No Clubbing cyanosis 2+ pitting on the right edema and massive on the left Skin-warm and dry A & Oriented  Grossly normal sensory and motor function  ECG atrial flutter at 103 with 3: 1 conduction -/10/43  CrCl cannot be calculated (Patient's most recent lab result is older than the maximum 21 days allowed.).   Assessment and  Plan  Atrial flutter-recurrent-atypical   Morbid obesity    Sleep disordered breathing   Dyspnea on exertion  HFpEF-presumed acute/chronic  The patient has recurrent atypical flutter.  We discussed a drug versus procedural approach, and he would prefer a procedure 1.  I will refer to Dr. Nelly Laurence for consideration of ablation of his left atrial flutters.  In the interim, we will try dronaderone 400 twice daily followed by cardioversion in the hopes that we can improve his functional status and perhaps allow some left atrial healing anticipation of ablation.  He is advised that if he reverts to atrial flutter following the  dronaderone based cardioversion he should let us know as we would want to stop it and at that point could consider dofetilide in the short-term.  He is significantly volume overloaded.  We will discontinue his low-dose torsemide as he also has early satiety suggesting right sided congestion.  Will put him on torsemide 40 x 5 days and 40 every other day.  Will add potassium 20 daily with his diuretic and check a metabolic profile in 2 weeks.    Recurrent atrial flutter status post ablation, has had multiple surgeries.  Now on flecainide holding sinus rhythm following  cardioversion.  He needs GXT for further arrhythmia assessment.  No bleeding on the Eliquis.  We will continue him at 5 mg twice daily.  PR interval and QRSd are within normal range for flecainide exposure.   Current medicines are reviewed at length with the patient today .  The patient does not  have concerns regarding medicines.

## 2023-10-30 NOTE — H&P (View-Only) (Signed)
 Patient Care Team: Garnette Gunner, MD as PCP - General (Family Medicine) Pricilla Riffle, MD as PCP - Cardiology (Cardiology) Beatrice Lecher, PA-C as Physician Assistant (Cardiology)   HPI  Richard Vazquez is a 68 y.o. male seen in follow-up for atrial flutter for which  he underwent electrophysiological testing and catheter ablation 5/23; there were multiple circuits identified but there was a stable counterclockwise circuit consistent with electrocardiograms which have been recorded during his hospitalizations.  It was elected to ablate his cavotricuspid isthmus; unfortunately, within a couple of weeks he had recurrence of a flutter.  Flecainide was initiated.  He underwent cardioversion and sustained sinus   Cardioversion at [facility] with rapid reversion  Atrial flutter was noted to be recurrent in November 2024; he elected to defer cardioversion stopped flecainide  Complaining of increasing dyspnea on exertion.  Peripheral edema with chronic swelling of his lower extremities bilaterally left greater than right but probably worse   venous evaluation showed no DVT    DATE TEST EF    10/22 Echo   >75% % Hyperdynamic with mild LVH                      Date Cr K Hgb  3/23 1.03 4.0 14.8              DATE PR interval QRSduration PQRS Dose  5/23  218 92 310 0  6/23 224 96 320 100    3/25 NA 96      Thromboembolic risk factors ( age -59, HTN-1, Vasc disease -1 (carotid)) for a CHADSVASc Score of >=3 Records and Results Reviewed   Past Medical History:  Diagnosis Date   Acute pain of right knee 05/16/2022   Atrial fibrillation (HCC)    Carotid artery disease (HCC)    Carotid US 09/01/2022: R 40-59, L 1-39   Colon polyps    COPD (chronic obstructive pulmonary disease) (HCC)    Former smoker 12/21/2015   Hyperlipemia    Kidney stones    Left carotid bruit 08/05/2021   Obesity    Paroxysmal atrial flutter (HCC) 05/29/2021   Sleep apnea    bipap   Sleep  apnea treated with nocturnal bilevel positive airway pressure (BPAP)     Past Surgical History:  Procedure Laterality Date   A-FLUTTER ABLATION N/A 01/16/2022   Procedure: A-FLUTTER ABLATION;  Surgeon: Duke Salvia, MD;  Location: [facility] INVASIVE CV LAB;  Service: Cardiovascular;  Laterality: N/A;   APPENDECTOMY     CARDIOVERSION N/A 05/31/2021   Procedure: CARDIOVERSION;  Surgeon: Chrystie Nose, MD;  Location: [facility] ENDOSCOPY;  Service: Cardiovascular;  Laterality: N/A;   CARDIOVERSION N/A 10/05/2021   Procedure: CARDIOVERSION;  Surgeon: Chrystie Nose, MD;  Location: [facility] ENDOSCOPY;  Service: Cardiovascular;  Laterality: N/A;   COLONOSCOPY     LEFT HEART CATH AND CORONARY ANGIOGRAPHY N/A 12/07/2021   Procedure: LEFT HEART CATH AND CORONARY ANGIOGRAPHY;  Surgeon: [name], MD;  Location: [facility] INVASIVE CV LAB;  Service: Cardiovascular;  Laterality: N/A;   LITHOTRIPSY     multiple   TEE WITHOUT CARDIOVERSION N/A 05/31/2021   Procedure: TRANSESOPHAGEAL ECHOCARDIOGRAM (TEE);  Surgeon: Chrystie Nose, MD;  Location: [facility] ENDOSCOPY;  Service: Cardiovascular;  Laterality: N/A;    Current Meds  Medication Sig   bisacodyl (DULCOLAX) 5 MG EC tablet Take 5 mg by mouth as needed.   cholecalciferol (VITAMIN D3) 25 MCG (1000 UNIT)  tablet Take 1,000 Units by mouth daily.   diclofenac Sodium (VOLTAREN) 1 % GEL Apply 4 g topically 4 (four) times daily as needed.   diltiazem (CARDIZEM CD) 240 MG 24 hr capsule TAKE 1 CAPSULE BY MOUTH DAILY   ELIQUIS 5 MG TABS tablet TAKE 1 TABLET BY MOUTH TWICE A DAY   furosemide (LASIX) 20 MG tablet Take 1 tablet (20 mg total) by mouth 2 (two) times daily.   indapamide (LOZOL) 2.5 MG tablet Take 2.5 mg by mouth daily.   Iron, Ferrous Sulfate, 325 (65 Fe) MG TABS Take 325 mg by mouth every other day.   Multiple Vitamin (MULTIVITAMIN) tablet Take 1 tablet by mouth daily.   nitroGLYCERIN (NITROSTAT) 0.4 MG SL tablet Place 1 tablet (0.4 mg total) under the tongue  every 5 (five) minutes as needed for chest pain (Max 3 doses).   Omega-3 1000 MG CAPS Take 1,000 mg by mouth daily.   polyethylene glycol (MIRALAX / GLYCOLAX) 17 g packet Take 17 g by mouth daily.   rosuvastatin (CRESTOR) 20 MG tablet TAKE 1 TABLET BY MOUTH DAILY   tamsulosin (FLOMAX) 0.4 MG CAPS capsule Take 1 capsule by mouth daily.   triamcinolone cream (KENALOG) 0.1 % Apply 1 Application topically 2 (two) times daily.    Allergies  Allergen Reactions   Codeine Itching   Latex Itching   Lipitor [Atorvastatin] Other (See Comments)    Myalgias      Review of Systems negative except from HPI and PMH  Physical Exam BP 126/72   Pulse (!) 103   Ht 6\' 3"  (1.905 m)   Wt (!) 309 lb 6.4 oz (140.3 kg)   SpO2 92%   BMI 38.67 kg/m  Well developed and Morbidly obese in no acute distress HENT normal Neck supple with JVP-  8-10 positive HJ  clear Rapid and regular rate and rhythm, no murmurs or gallops Abd-soft with active BS No Clubbing cyanosis 2+ pitting on the right edema and massive on the left Skin-warm and dry A & Oriented  Grossly normal sensory and motor function  ECG atrial flutter at 103 with 3: 1 conduction -/10/43  CrCl cannot be calculated (Patient's most recent lab result is older than the maximum 21 days allowed.).   Assessment and  Plan  Atrial flutter-recurrent-atypical   Morbid obesity    Sleep disordered breathing   Dyspnea on exertion  HFpEF-presumed acute/chronic  The patient has recurrent atypical flutter.  We discussed a drug versus procedural approach, and he would prefer a procedure 1.  I will refer to Dr. Nelly Laurence for consideration of ablation of his left atrial flutters.  In the interim, we will try dronaderone 400 twice daily followed by cardioversion in the hopes that we can improve his functional status and perhaps allow some left atrial healing anticipation of ablation.  He is advised that if he reverts to atrial flutter following the  dronaderone based cardioversion he should let us know as we would want to stop it and at that point could consider dofetilide in the short-term.  He is significantly volume overloaded.  We will discontinue his low-dose torsemide as he also has early satiety suggesting right sided congestion.  Will put him on torsemide 40 x 5 days and 40 every other day.  Will add potassium 20 daily with his diuretic and check a metabolic profile in 2 weeks.    Recurrent atrial flutter status post ablation, has had multiple surgeries.  Now on flecainide holding sinus rhythm following  cardioversion.  He needs GXT for further arrhythmia assessment.  No bleeding on the Eliquis.  We will continue him at 5 mg twice daily.  PR interval and QRSd are within normal range for flecainide exposure.   Current medicines are reviewed at length with the patient today .  The patient does not  have concerns regarding medicines.

## 2023-10-31 LAB — CBC
Hematocrit: 43.4 % (ref 37.5–51.0)
Hemoglobin: 14.3 g/dL (ref 13.0–17.7)
MCH: 30.2 pg (ref 26.6–33.0)
MCHC: 32.9 g/dL (ref 31.5–35.7)
MCV: 92 fL (ref 79–97)
Platelets: 233 10*3/uL (ref 150–450)
RBC: 4.73 x10E6/uL (ref 4.14–5.80)
RDW: 14.5 % (ref 11.6–15.4)
WBC: 8.7 10*3/uL (ref 3.4–10.8)

## 2023-10-31 LAB — BASIC METABOLIC PANEL
BUN/Creatinine Ratio: 18 (ref 10–24)
BUN: 21 mg/dL (ref 8–27)
CO2: 24 mmol/L (ref 20–29)
Calcium: 9.2 mg/dL (ref 8.6–10.2)
Chloride: 101 mmol/L (ref 96–106)
Creatinine, Ser: 1.15 mg/dL (ref 0.76–1.27)
Glucose: 221 mg/dL — ABNORMAL HIGH (ref 70–99)
Potassium: 3.7 mmol/L (ref 3.5–5.2)
Sodium: 142 mmol/L (ref 134–144)
eGFR: 70 mL/min/{1.73_m2} (ref >59)

## 2023-10-31 NOTE — Progress Notes (Signed)
 Spoke to pt and instructed them to come at 0630 and to be NPO after 0000.  Confirmed no missed doses of AC and instructed to take in AM with a small sip of water.  Confirmed that pt will have a ride home and someone to stay with them for 24 hours after the procedure. Instructed patient to not wear any jewelry or lotion.

## 2023-11-01 ENCOUNTER — Other Ambulatory Visit: Payer: Self-pay

## 2023-11-01 ENCOUNTER — Ambulatory Visit (HOSPITAL_COMMUNITY): Payer: Self-pay

## 2023-11-01 ENCOUNTER — Ambulatory Visit (HOSPITAL_COMMUNITY)
Admission: RE | Admit: 2023-11-01 | Discharge: 2023-11-01 | Disposition: A | Discharge status: Home / Self Care | Attending: Internal Medicine | Admitting: Internal Medicine

## 2023-11-01 ENCOUNTER — Encounter (HOSPITAL_COMMUNITY): Payer: Self-pay | Admitting: Internal Medicine

## 2023-11-01 ENCOUNTER — Encounter (HOSPITAL_COMMUNITY)
Admission: RE | Disposition: A | Discharge status: Home / Self Care | Payer: Self-pay | Source: Home / Self Care | Attending: Internal Medicine

## 2023-11-01 DIAGNOSIS — Z79899 Other long term (current) drug therapy: Secondary | ICD-10-CM | POA: Insufficient documentation

## 2023-11-01 DIAGNOSIS — I10 Essential (primary) hypertension: Secondary | ICD-10-CM

## 2023-11-01 DIAGNOSIS — I110 Hypertensive heart disease with heart failure: Secondary | ICD-10-CM | POA: Diagnosis not present

## 2023-11-01 DIAGNOSIS — Z6838 Body mass index (BMI) 38.0-38.9, adult: Secondary | ICD-10-CM | POA: Diagnosis not present

## 2023-11-01 DIAGNOSIS — Z87891 Personal history of nicotine dependence: Secondary | ICD-10-CM | POA: Diagnosis not present

## 2023-11-01 DIAGNOSIS — I4892 Unspecified atrial flutter: Secondary | ICD-10-CM

## 2023-11-01 DIAGNOSIS — G4733 Obstructive sleep apnea (adult) (pediatric): Secondary | ICD-10-CM | POA: Diagnosis not present

## 2023-11-01 DIAGNOSIS — J449 Chronic obstructive pulmonary disease, unspecified: Secondary | ICD-10-CM

## 2023-11-01 DIAGNOSIS — I2510 Atherosclerotic heart disease of native coronary artery without angina pectoris: Secondary | ICD-10-CM | POA: Diagnosis not present

## 2023-11-01 DIAGNOSIS — G4730 Sleep apnea, unspecified: Secondary | ICD-10-CM | POA: Diagnosis not present

## 2023-11-01 DIAGNOSIS — I5032 Chronic diastolic (congestive) heart failure: Secondary | ICD-10-CM | POA: Diagnosis not present

## 2023-11-01 DIAGNOSIS — Z7901 Long term (current) use of anticoagulants: Secondary | ICD-10-CM | POA: Diagnosis not present

## 2023-11-01 DIAGNOSIS — E66813 Obesity, class 3: Secondary | ICD-10-CM | POA: Insufficient documentation

## 2023-11-01 DIAGNOSIS — R0609 Other forms of dyspnea: Secondary | ICD-10-CM | POA: Insufficient documentation

## 2023-11-01 DIAGNOSIS — I4819 Other persistent atrial fibrillation: Secondary | ICD-10-CM

## 2023-11-01 HISTORY — PX: CARDIOVERSION: EP1203

## 2023-11-01 SURGERY — CARDIOVERSION (CATH LAB)
Anesthesia: General

## 2023-11-01 MED ORDER — SODIUM CHLORIDE 0.9% FLUSH
INTRAVENOUS | Status: DC | PRN
  Administered 2023-11-01: 10 mL via INTRAVENOUS

## 2023-11-01 MED ORDER — SODIUM CHLORIDE 0.9% FLUSH: 3.0000 mL | INTRAVENOUS | Status: DC | PRN

## 2023-11-01 MED ORDER — PROPOFOL 10 MG/ML IV BOLUS
INTRAVENOUS | Status: DC | PRN
  Administered 2023-11-01: 80 mg via INTRAVENOUS

## 2023-11-01 MED ORDER — LIDOCAINE 2% (20 MG/ML) 5 ML SYRINGE
INTRAMUSCULAR | Status: DC | PRN
  Administered 2023-11-01: 60 mg via INTRAVENOUS

## 2023-11-01 MED ORDER — SODIUM CHLORIDE 0.9% FLUSH: 3.0000 mL | Freq: Two times a day (BID) | INTRAVENOUS | Status: DC

## 2023-11-01 SURGICAL SUPPLY — 1 items: PAD DEFIB RADIO PHYSIO CONN (PAD) ×1 IMPLANT

## 2023-11-01 NOTE — Interval H&P Note (Signed)
 History and Physical Interval Note:  11/01/2023 7:39 AM  Richard Vazquez  has presented today for surgery, with the diagnosis of AFLUTTER.  The various methods of treatment have been discussed with the patient and family. After consideration of risks, benefits and other options for treatment, the patient has consented to  Procedure(s): CARDIOVERSION (N/A) as a surgical intervention.  The patient's history has been reviewed, patient examined, no change in status, stable for surgery.  I have reviewed the patient's chart and labs.  Questions were answered to the patient's satisfaction.     Jafar Poffenberger A Amour Trigg

## 2023-11-01 NOTE — Anesthesia Postprocedure Evaluation (Signed)
 Anesthesia Post Note  Patient: Delante Karapetyan  Procedure(s) Performed: CARDIOVERSION     Patient location during evaluation: Cath Lab Anesthesia Type: General Level of consciousness: awake and alert Pain management: pain level controlled Vital Signs Assessment: post-procedure vital signs reviewed and stable Respiratory status: spontaneous breathing, nonlabored ventilation and respiratory function stable Cardiovascular status: blood pressure returned to baseline and stable Postop Assessment: no apparent nausea or vomiting Anesthetic complications: no  No notable events documented.  Last Vitals:  Vitals:   11/01/23 0750 11/01/23 0800  BP: 115/74 125/81  Pulse: 91 89  Resp: 19 17  Temp:    SpO2: 94% 93%    Last Pain:  Vitals:   11/01/23 0800  TempSrc:   PainSc: 0-No pain                 Bashar Milam,W. EDMOND

## 2023-11-01 NOTE — Transfer of Care (Signed)
 Immediate Anesthesia Transfer of Care Note  Patient: Richard Vazquez  Procedure(s) Performed: CARDIOVERSION  Patient Location: PACU and Cath Lab  Anesthesia Type:MAC  Level of Consciousness: awake and patient cooperative  Airway & Oxygen Therapy: Patient Spontanous Breathing and Patient connected to face mask oxygen  Post-op Assessment: Report given to RN and Post -op Vital signs reviewed and stable  Post vital signs: Reviewed and stable  Last Vitals:  Vitals Value Taken Time  BP 112/78 11/01/23 0747  Temp    Pulse 89 11/01/23 0747  Resp 27 11/01/23 0747  SpO2 96%  11/01/23 0747    Last Pain:  Vitals:   11/01/23 0658  TempSrc: Temporal  PainSc: 0-No pain         Complications: No notable events documented.

## 2023-11-01 NOTE — CV Procedure (Signed)
    Electrical Cardioversion Procedure Note Richard Vazquez [id number] [birth year]  Procedure: Electrical Cardioversion Indications:  Atrial Flutter  Time Out: Verified patient identification, verified procedure,medications/allergies/relevent history reviewed, required imaging and test results available.  Performed  Procedure Details  The patient was NPO after midnight. Anesthesia was administered at the beside  by Dr.Fitzgerald.  Cardioversion was done with synchronized biphasic defibrillation with AP pads with 200 Joules.  The patient converted to sinus rhythm The patient tolerated the procedure well.  IMPRESSION:  Successful cardioversion of atrial flutter   Riley Lam, MD FASE [facility] Cardiologist [facility]  [address] [place], [address] [place], [place] [address] [phone]  7:53 AM

## 2023-11-01 NOTE — Anesthesia Preprocedure Evaluation (Addendum)
 Anesthesia Evaluation  Patient identified by MRN, date of birth, ID band Patient awake    Reviewed: Allergy & Precautions, H&P , NPO status , Patient's Chart, lab work & pertinent test results  Airway Mallampati: III  TM Distance: >3 FB Neck ROM: Full    Dental no notable dental hx. (+) Teeth Intact, Dental Advisory Given   Pulmonary sleep apnea and Continuous Positive Airway Pressure Ventilation , COPD, former smoker   Pulmonary exam normal breath sounds clear to auscultation       Cardiovascular hypertension, Pt. on medications + dysrhythmias Atrial Fibrillation  Rhythm:Irregular Rate:Normal     Neuro/Psych negative neurological ROS  negative psych ROS   GI/Hepatic negative GI ROS, Neg liver ROS,,,  Endo/Other    Class 3 obesity  Renal/GU negative Renal ROS  negative genitourinary   Musculoskeletal   Abdominal   Peds  Hematology  (+) Blood dyscrasia, anemia   Anesthesia Other Findings   Reproductive/Obstetrics negative OB ROS                             Anesthesia Physical Anesthesia Plan  ASA: 3  Anesthesia Plan: General   Post-op Pain Management: Minimal or no pain anticipated   Induction: Intravenous  PONV Risk Score and Plan: 2 and Propofol infusion and Treatment may vary due to age or medical condition  Airway Management Planned: Mask  Additional Equipment:   Intra-op Plan:   Post-operative Plan:   Informed Consent: I have reviewed the patients History and Physical, chart, labs and discussed the procedure including the risks, benefits and alternatives for the proposed anesthesia with the patient or authorized representative who has indicated his/her understanding and acceptance.     Dental advisory given  Plan Discussed with: CRNA  Anesthesia Plan Comments:        Anesthesia Quick Evaluation

## 2023-11-02 ENCOUNTER — Encounter: Payer: Self-pay | Admitting: Internal Medicine

## 2023-11-08 ENCOUNTER — Other Ambulatory Visit: Payer: Self-pay

## 2023-11-08 ENCOUNTER — Telehealth: Payer: Self-pay | Admitting: Acute Care

## 2023-11-08 DIAGNOSIS — Z122 Encounter for screening for malignant neoplasm of respiratory organs: Secondary | ICD-10-CM

## 2023-11-08 DIAGNOSIS — Z87891 Personal history of nicotine dependence: Secondary | ICD-10-CM

## 2023-11-08 NOTE — Telephone Encounter (Signed)
 Lung Cancer Screening Narrative/Criteria Questionnaire (Cigarette Smokers Only- No Cigars/Pipes/vapes)   Richard Vazquez   SDMV:12/03/23 at 11:15a [name]                                           [birth year]              LDCT: 12/04/23 at 11:40a/ [facility]    68 y.o.   Phone: [phone]  Lung Screening Narrative (confirm age 90-77 yrs Medicare / 50-80 yrs Private pay insurance)   Insurance information:BCBS   Referring Provider:Janee Morn   This screening involves an initial phone call with a team member from our program. It is called a shared decision making visit. The initial meeting is required by insurance and Medicare to make sure you understand the program. This appointment takes about 15-20 minutes to complete. The CT scan will completed at a separate date/time. This scan takes about 5-10 minutes to complete and you may eat and drink before and after the scan.  Criteria questions for Lung Cancer Screening:   Are you a current or former smoker? Former Age began smoking: 68 yo   If you are a former smoker, what year did you quit smoking?2021   To calculate your smoking history, I need an accurate estimate of how many packs of cigarettes you smoked per day and for how many years. (Not just the number of PPD you are now smoking)   Years smoking 46 x Packs per day 1.5 = Pack years 15   (at least 20 pack yrs)   (Make sure they understand that we need to know how much they have smoked in the past, not just the number of PPD they are smoking now)  Do you have a personal history of cancer?  No    Do you have a family history of cancer? Yes  (cancer type and and relative) pat uncle/colon  Are you coughing up blood?  No  Have you had unexplained weight loss of 15 lbs or more in the last 6 months? No  It looks like you meet all criteria.     Additional information: N/A

## 2023-11-11 DIAGNOSIS — G4733 Obstructive sleep apnea (adult) (pediatric): Secondary | ICD-10-CM | POA: Diagnosis not present

## 2023-11-13 DIAGNOSIS — Z79899 Other long term (current) drug therapy: Secondary | ICD-10-CM | POA: Diagnosis not present

## 2023-11-13 DIAGNOSIS — I484 Atypical atrial flutter: Secondary | ICD-10-CM | POA: Diagnosis not present

## 2023-11-14 DIAGNOSIS — E876 Hypokalemia: Secondary | ICD-10-CM

## 2023-11-14 DIAGNOSIS — R7989 Other specified abnormal findings of blood chemistry: Secondary | ICD-10-CM

## 2023-11-14 LAB — BASIC METABOLIC PANEL
BUN/Creatinine Ratio: 19 (ref 10–24)
BUN: 25 mg/dL (ref 8–27)
CO2: 25 mmol/L (ref 20–29)
Calcium: 9.5 mg/dL (ref 8.6–10.2)
Chloride: 93 mmol/L — ABNORMAL LOW (ref 96–106)
Creatinine, Ser: 1.3 mg/dL — ABNORMAL HIGH (ref 0.76–1.27)
Glucose: 227 mg/dL — ABNORMAL HIGH (ref 70–99)
Potassium: 3.2 mmol/L — ABNORMAL LOW (ref 3.5–5.2)
Sodium: 140 mmol/L (ref 134–144)
eGFR: 60 mL/min/{1.73_m2} (ref >59)

## 2023-11-20 ENCOUNTER — Encounter: Payer: Self-pay | Admitting: Cardiovascular Disease

## 2023-11-20 ENCOUNTER — Ambulatory Visit: Attending: Cardiovascular Disease | Admitting: Cardiovascular Disease

## 2023-11-20 VITALS — BP 130/74 | HR 81 | Ht 75.0 in | Wt 297.0 lb

## 2023-11-20 DIAGNOSIS — I484 Atypical atrial flutter: Secondary | ICD-10-CM

## 2023-11-20 DIAGNOSIS — I4819 Other persistent atrial fibrillation: Secondary | ICD-10-CM | POA: Diagnosis not present

## 2023-11-20 DIAGNOSIS — I495 Sick sinus syndrome: Secondary | ICD-10-CM | POA: Diagnosis not present

## 2023-11-20 DIAGNOSIS — I4510 Unspecified right bundle-branch block: Secondary | ICD-10-CM

## 2023-11-20 NOTE — Progress Notes (Signed)
 Electrophysiology Office Note:    Date:  11/20/2023   ID:  Richard Vazquez, DOB 07-02-1956, MRN [id number]  PCP:  Garnette Gunner, MD   [facility] Providers Cardiologist:  Dietrich Pates, MD Cardiology APP:  Kennon Rounds     Referring MD: Garnette Gunner, MD   History of Present Illness:    Richard Vazquez is a 68 y.o. male with a medical history significant for atrial flutter ablation May 2023., referred for management of atypical flutter and atrial fibrillation.     He underwent ablation for atrial flutter and indicated that there were multiple circuits identified but a stable counterclockwise substrate consistent with CTI dependent flutter which was ablated.  Unfortunately he had recurrence of flutter within a few weeks.  Flecainide was started.  He has had multiple cardioversions since.  Flecainide was stopped.  He has since been placed on dronedarone  I discussed the use of AI scribe software for clinical note transcription with the patient, who gave verbal consent to proceed.  He has a history of atrial fibrillation and underwent an ablation procedure in 2023. Despite the procedure, he continues to experience episodes of atrial fibrillation. He has been on various medications including flecainide, Multaq, and diltiazem, but is currently not on any antiarrhythmic medications. These medications were not effective in managing his condition.  During episodes of atrial fibrillation, he sometimes remains asymptomatic but often becomes easily winded and experiences shortness of breath, particularly during physical activities such as walking up stairs.  He is working on weight loss, using an exercise bike and planning to walk more as the weather improves. He is trying to make dietary changes, including reducing sugar intake and using Stevia as a sweetener.  He has a history of sleep apnea and uses a BiPAP machine consistently every night.     Today, he reports that he feels  well  EKGs/Labs/Other Studies Reviewed Today:     Echocardiogram:  TTE 11/2021 EF 65-70%.  Normal mitral and aortic valves.  Normal left atrial size  TTE scheduled  Monitors:  14 day monitor June 2024-- my interpretation Sinus rhythm, heart rate 44 214 bpm 7.  Occasional SVE, 4.1% 3 atrial runs.  Rare PVCs.   Cardiac catherization  April 2023 Mild obstructive coronary disease. Normal LV EDP  EKG:   EKG Interpretation Date/Time:  Tuesday November 20 2023 08:00:53 EDT Ventricular Rate:  81 PR Interval:  204 QRS Duration:  90 QT Interval:  402 QTC Calculation: 466 R Axis:   76  Text Interpretation: Sinus rhythm with Premature atrial complexes When compared with ECG of 01-Nov-2023 07:47, Premature atrial complexes are now Present Confirmed by [name] [phone]) on 11/20/2023 8:12:14 AM   I reviewed all available ECGs in MUSE.  These show frequent atrial flutter ; I suspect there is also coarse atrial fibrillation  Physical Exam:    VS:  BP 130/74 (BP Location: Left Arm, Patient Position: Sitting, Cuff Size: Large)   Pulse 81   Ht 6\' 3"  (1.905 m)   Wt 297 lb (134.7 kg)   SpO2 95%   BMI 37.12 kg/m     Wt Readings from Last 3 Encounters:  11/20/23 297 lb (134.7 kg)  11/01/23 (!) 309 lb (140.2 kg)  10/30/23 (!) 309 lb 6.4 oz (140.3 kg)     GEN: Well nourished, well developed in no acute distress CARDIAC: RRR, no murmurs, rubs, gallops RESPIRATORY:  Normal work of breathing MUSCULOSKELETAL: trace edema    ASSESSMENT &  PLAN:     Atypical Atrial flutter and atrial fibrillation History of CTI ablation in 2023 Multiple flutter circuits apparent during 2023 ablation Many ECGs have appearance of coarse AF to me We discussed the indication for mapping flutter and ablating AF/flutter.  Using a shared decision making approach, we elected to schedule the procedure.  We discussed the indication, rationale, logistics, anticipated benefits, and potential risks of the  ablation procedure including but not limited to -- bleed at the groin access site, chest pain, damage to nearby organs such as the diaphragm, lungs, or esophagus, need for a drainage tube, or prolonged hospitalization. I explained that the risk for stroke, heart attack, need for open chest surgery, or even death is very low but not zero. he  expressed understanding and wishes to proceed.   Morbid obesity We discussed the importance of weight loss at length and strategies including calorie counting  Sleep disordered breathing He is religious about using BiPap  Secondary hypercoagulable state Continue Eliquis 5 mg twice daily  Heart failure with preserved ejection fraction I think he will benefit from sinus rhythm With mild edema today, wearing compression socks    Signed, [name], MD  11/20/2023 8:48 AM    [name]

## 2023-11-20 NOTE — Patient Instructions (Signed)
 Medication Instructions:  Your physician recommends that you continue on your current medications as directed. Please refer to the Current Medication list given to you today. *If you need a refill on your cardiac medications before your next appointment, please call your pharmacy*   Lab Work: CBC and BMET - Please have your pre-procedure labs completed at [facility] on Wednesday, April 30 (or shortly after) - no appointment required and this does not need to be fasting If you have labs (blood work) drawn today and your tests are completely normal, you will receive your results only by: MyChart Message (if you have MyChart) OR A paper copy in the mail If you have any lab test that is abnormal or we need to change your treatment, we will call you to review the results.   Testing/Procedures: Cardiac CT - someone will contact you to set this up Your physician has requested that you have cardiac CT. Cardiac computed tomography (CT) is a painless test that uses an x-ray machine to take clear, detailed pictures of your heart. For further information please visit [email or web address] Please follow instruction sheet as given.   Atrial Fibrillation Ablation - scheduled for Friday, May 30 Your physician has recommended that you have an ablation. Catheter ablation is a medical procedure used to treat some cardiac arrhythmias (irregular heartbeats). During catheter ablation, a long, thin, flexible tube is put into a blood vessel in your groin (upper thigh), or neck. This tube is called an ablation catheter. It is then guided to your heart through the blood vessel. Radio frequency waves destroy small areas of heart tissue where abnormal heartbeats may cause an arrhythmia to start. Please see the instruction sheet given to you today.   Follow-Up: At [facility], you and your health needs are our priority.  As part of our continuing mission to provide you with exceptional heart care, we have created  designated Provider Care Teams.  These Care Teams include your primary Cardiologist (physician) and Advanced Practice Providers (APPs -  Physician Assistants and Nurse Practitioners) who all work together to provide you with the care you need, when you need it.  We recommend signing up for the patient portal called "MyChart".  Sign up information is provided on this After Visit Summary.  MyChart is used to connect with patients for Virtual Visits (Telemedicine).  Patients are able to view lab/test results, encounter notes, upcoming appointments, etc.  Non-urgent messages can be sent to your provider as well.   To learn more about what you can do with MyChart, go to [email or web address].    Your next appointment:   We will schedule follow up after your ablation   Provider:   [name], MD

## 2023-11-22 ENCOUNTER — Ambulatory Visit (HOSPITAL_COMMUNITY): Attending: Cardiology

## 2023-11-22 DIAGNOSIS — I509 Heart failure, unspecified: Secondary | ICD-10-CM | POA: Diagnosis not present

## 2023-11-22 DIAGNOSIS — I484 Atypical atrial flutter: Secondary | ICD-10-CM | POA: Diagnosis not present

## 2023-11-22 LAB — ECHOCARDIOGRAM COMPLETE
Area-P 1/2: 3.1 cm2
S' Lateral: 2.7 cm

## 2023-11-22 MED ORDER — PERFLUTREN LIPID MICROSPHERE
1.0000 mL | INTRAVENOUS | Status: AC | PRN
  Administered 2023-11-22: 3 mL via INTRAVENOUS

## 2023-11-23 DIAGNOSIS — I4510 Unspecified right bundle-branch block: Secondary | ICD-10-CM | POA: Diagnosis not present

## 2023-11-23 DIAGNOSIS — I484 Atypical atrial flutter: Secondary | ICD-10-CM | POA: Diagnosis not present

## 2023-11-23 DIAGNOSIS — I4819 Other persistent atrial fibrillation: Secondary | ICD-10-CM | POA: Diagnosis not present

## 2023-11-23 DIAGNOSIS — I495 Sick sinus syndrome: Secondary | ICD-10-CM | POA: Diagnosis not present

## 2023-11-23 LAB — CBC
Hematocrit: 45.8 % (ref 37.5–51.0)
Hemoglobin: 15.6 g/dL (ref 13.0–17.7)
MCH: 31.3 pg (ref 26.6–33.0)
MCHC: 34.1 g/dL (ref 31.5–35.7)
MCV: 92 fL (ref 79–97)
Platelets: 229 10*3/uL (ref 150–450)
RBC: 4.99 x10E6/uL (ref 4.14–5.80)
RDW: 14.3 % (ref 11.6–15.4)
WBC: 7.5 10*3/uL (ref 3.4–10.8)

## 2023-11-23 MED ORDER — POTASSIUM CHLORIDE CRYS ER 20 MEQ PO TBCR: 20.0000 meq | EXTENDED_RELEASE_TABLET | Freq: Every day | ORAL | 1 refills | Status: DC

## 2023-11-24 LAB — BASIC METABOLIC PANEL WITH GFR
BUN/Creatinine Ratio: 17 (ref 10–24)
BUN: 24 mg/dL (ref 8–27)
CO2: 23 mmol/L (ref 20–29)
Calcium: 9.9 mg/dL (ref 8.6–10.2)
Chloride: 94 mmol/L — ABNORMAL LOW (ref 96–106)
Creatinine, Ser: 1.45 mg/dL — ABNORMAL HIGH (ref 0.76–1.27)
Glucose: 283 mg/dL — ABNORMAL HIGH (ref 70–99)
Potassium: 3.5 mmol/L (ref 3.5–5.2)
Sodium: 140 mmol/L (ref 134–144)
eGFR: 53 mL/min/{1.73_m2} — ABNORMAL LOW (ref >59)

## 2023-11-27 ENCOUNTER — Encounter: Payer: Self-pay | Admitting: Internal Medicine

## 2023-11-29 ENCOUNTER — Encounter: Payer: Self-pay | Admitting: Cardiovascular Disease

## 2023-12-03 ENCOUNTER — Ambulatory Visit: Payer: Self-pay

## 2023-12-03 ENCOUNTER — Encounter: Admitting: Adult Health

## 2023-12-03 ENCOUNTER — Encounter: Payer: Self-pay | Admitting: Acute Care

## 2023-12-03 ENCOUNTER — Ambulatory Visit: Admitting: Acute Care

## 2023-12-03 DIAGNOSIS — Z87891 Personal history of nicotine dependence: Secondary | ICD-10-CM

## 2023-12-03 NOTE — Patient Instructions (Signed)

## 2023-12-03 NOTE — Progress Notes (Signed)
 Virtual Visit via Telephone Note  I connected with Richard Vazquez on 12/03/23 at  1:30 PM EDT by telephone and verified that I am speaking with the correct person using two identifiers.  Location: Patient:  At home Provider: [address], [place], [place], [address]    I discussed the limitations, risks, security and privacy concerns of performing an evaluation and management service by telephone and the availability of in person appointments. I also discussed with the patient that there may be a patient responsible charge related to this service. The patient expressed understanding and agreed to proceed.   Shared Decision Making Visit Lung Cancer Screening Program [phone])   Eligibility: Age 68 y.o. Pack Years Smoking History Calculation 69 pack years (# packs/per year x # years smoked) Recent History of coughing up blood  no Unexplained weight loss? no ( >Than 15 pounds within the last 6 months ) Prior History Lung / other cancer no (Diagnosis within the last 5 years already requiring surveillance chest CT Scans). Smoking Status Former Smoker Former Smokers: Years since quit: 4 years  Quit Date: 12/09/2019  Visit Components: Discussion included one or more decision making aids. yes Discussion included risk/benefits of screening. yes Discussion included potential follow up diagnostic testing for abnormal scans. yes Discussion included meaning and risk of over diagnosis. yes Discussion included meaning and risk of False Positives. yes Discussion included meaning of total radiation exposure. yes  Counseling Included: Importance of adherence to annual lung cancer LDCT screening. yes Impact of comorbidities on ability to participate in the program. yes Ability and willingness to under diagnostic treatment. yes  Smoking Cessation Counseling: Current Smokers:  Discussed importance of smoking cessation. yes Information about tobacco cessation classes and interventions provided  to patient. yes Patient provided with "ticket" for LDCT Scan. no Symptomatic Patient. no  Counseling NA Diagnosis Code: Tobacco Use Z72.0 Asymptomatic Patient yes  Counseling (Intermediate counseling: > three minutes counseling) [id number] Former Smokers:  Discussed the importance of maintaining cigarette abstinence. yes Diagnosis Code: Personal History of Nicotine Dependence. W29.562 Information about tobacco cessation classes and interventions provided to patient. Yes Patient provided with "ticket" for LDCT Scan. yes Written Order for Lung Cancer Screening with LDCT placed in Epic. Yes (CT Chest Lung Cancer Screening Low Dose W/O CM) [id number] Z12.2-Screening of respiratory organs Z87.891-Personal history of nicotine dependence   [name], NP

## 2023-12-03 NOTE — Telephone Encounter (Signed)
 Pt called in to verify he is able to take his medications in the morning prior to his Lung CT, called [facility] who advised pt should call radiology department. This RN spoke to [place] with [facility], pt does not need to be NPO prior to test. Pt made aware, verbalized understanding and agrees to plan.  Copied from CRM [id number]. Topic: Clinical - Medication Question >> Dec 03, 2023  2:26 PM Isabell A wrote: Reason for CRM: Patient would like to confirm if he can take his morning medications at 9:30am, his appointment is at 11:40 am for his scan - would like to confirm if he needs to take his medication at normal timing or adjust his schedule.   Callback number: [phone] Reason for Disposition  General information question, no triage required and triager able to answer question  Answer Assessment - Initial Assessment Questions 1. REASON FOR CALL or QUESTION: "What is your reason for calling today?" or "How can I best help you?" or "What question do you have that I can help answer?"     Questions regarding CT scan  Protocols used: Information Only Call - No Triage-A-AH

## 2023-12-04 ENCOUNTER — Ambulatory Visit
Admission: RE | Admit: 2023-12-04 | Discharge: 2023-12-04 | Disposition: A | Discharge status: Home / Self Care | Source: Ambulatory Visit | Attending: Family Medicine | Admitting: Family Medicine

## 2023-12-04 DIAGNOSIS — Z87891 Personal history of nicotine dependence: Secondary | ICD-10-CM | POA: Diagnosis not present

## 2023-12-04 DIAGNOSIS — Z122 Encounter for screening for malignant neoplasm of respiratory organs: Secondary | ICD-10-CM | POA: Diagnosis not present

## 2023-12-13 ENCOUNTER — Encounter: Payer: Self-pay | Admitting: Internal Medicine

## 2023-12-16 ENCOUNTER — Encounter (HOSPITAL_BASED_OUTPATIENT_CLINIC_OR_DEPARTMENT_OTHER): Payer: Self-pay | Admitting: Emergency Medicine

## 2023-12-16 ENCOUNTER — Other Ambulatory Visit: Payer: Self-pay

## 2023-12-16 ENCOUNTER — Emergency Department (HOSPITAL_BASED_OUTPATIENT_CLINIC_OR_DEPARTMENT_OTHER)
Admission: EM | Admit: 2023-12-16 | Discharge: 2023-12-16 | Disposition: A | Discharge status: Home / Self Care | Attending: Emergency Medicine | Admitting: Emergency Medicine

## 2023-12-16 DIAGNOSIS — Z91040 Latex allergy status: Secondary | ICD-10-CM | POA: Diagnosis not present

## 2023-12-16 DIAGNOSIS — R739 Hyperglycemia, unspecified: Secondary | ICD-10-CM | POA: Diagnosis not present

## 2023-12-16 DIAGNOSIS — Z7901 Long term (current) use of anticoagulants: Secondary | ICD-10-CM | POA: Diagnosis not present

## 2023-12-16 DIAGNOSIS — E1165 Type 2 diabetes mellitus with hyperglycemia: Secondary | ICD-10-CM | POA: Diagnosis not present

## 2023-12-16 LAB — CBC WITH DIFFERENTIAL/PLATELET
Abs Immature Granulocytes: 0.04 10*3/uL (ref 0.00–0.07)
Basophils Absolute: 0.1 10*3/uL (ref 0.0–0.1)
Basophils Relative: 1 %
Eosinophils Absolute: 0.4 10*3/uL (ref 0.0–0.5)
Eosinophils Relative: 4 %
HCT: 44.7 % (ref 39.0–52.0)
Hemoglobin: 15.5 g/dL (ref 13.0–17.0)
Immature Granulocytes: 1 %
Lymphocytes Relative: 14 %
Lymphs Abs: 1.1 10*3/uL (ref 0.7–4.0)
MCH: 31.3 pg (ref 26.0–34.0)
MCHC: 34.7 g/dL (ref 30.0–36.0)
MCV: 90.1 fL (ref 80.0–100.0)
Monocytes Absolute: 0.7 10*3/uL (ref 0.1–1.0)
Monocytes Relative: 9 %
Neutro Abs: 5.6 10*3/uL (ref 1.7–7.7)
Neutrophils Relative %: 71 %
Platelets: 232 10*3/uL (ref 150–400)
RBC: 4.96 MIL/uL (ref 4.22–5.81)
RDW: 13.9 % (ref 11.5–15.5)
WBC: 7.9 10*3/uL (ref 4.0–10.5)
nRBC: 0 % (ref 0.0–0.2)

## 2023-12-16 LAB — CBG MONITORING, ED
Glucose-Capillary: 495 mg/dL — ABNORMAL HIGH (ref 70–99)
Glucose-Capillary: 575 mg/dL (ref 70–99)
Glucose-Capillary: 574 mg/dL (ref 70–99)
Glucose-Capillary: 428 mg/dL — ABNORMAL HIGH (ref 70–99)

## 2023-12-16 LAB — COMPREHENSIVE METABOLIC PANEL WITH GFR
ALT: 39 U/L (ref 0–44)
AST: 45 U/L — ABNORMAL HIGH (ref 15–41)
Albumin: 3.6 g/dL (ref 3.5–5.0)
Alkaline Phosphatase: 90 U/L (ref 38–126)
Anion gap: 14 (ref 5–15)
BUN: 27 mg/dL — ABNORMAL HIGH (ref 8–23)
CO2: 30 mmol/L (ref 22–32)
Calcium: 9.4 mg/dL (ref 8.9–10.3)
Chloride: 90 mmol/L — ABNORMAL LOW (ref 98–111)
Creatinine, Ser: 1.36 mg/dL — ABNORMAL HIGH (ref 0.61–1.24)
GFR, Estimated: 57 mL/min — ABNORMAL LOW (ref >60)
Glucose, Bld: 576 mg/dL (ref 70–99)
Potassium: 3.9 mmol/L (ref 3.5–5.1)
Sodium: 134 mmol/L — ABNORMAL LOW (ref 135–145)
Total Bilirubin: 1 mg/dL (ref 0.0–1.2)
Total Protein: 7.6 g/dL (ref 6.5–8.1)

## 2023-12-16 LAB — URINALYSIS, ROUTINE W REFLEX MICROSCOPIC
Bilirubin Urine: NEGATIVE
Glucose, UA: >=500 mg/dL — AB
Ketones, ur: NEGATIVE mg/dL
Leukocytes,Ua: NEGATIVE
Nitrite: NEGATIVE
Protein, ur: NEGATIVE mg/dL
Specific Gravity, Urine: 1.01 (ref 1.005–1.030)
pH: 5.5 (ref 5.0–8.0)

## 2023-12-16 LAB — URINALYSIS, MICROSCOPIC (REFLEX)
Squamous Epithelial / HPF: NONE SEEN /HPF (ref 0–5)
WBC, UA: NONE SEEN WBC/hpf (ref 0–5)

## 2023-12-16 MED ORDER — SODIUM CHLORIDE 0.9 % IV BOLUS
500.0000 mL | Freq: Once | INTRAVENOUS | Status: AC
  Administered 2023-12-16: 500 mL via INTRAVENOUS

## 2023-12-16 MED ORDER — INSULIN ASPART 100 UNIT/ML IV SOLN
15.0000 [IU] | Freq: Once | INTRAVENOUS | Status: DC
Start: 2023-12-16 — End: 2023-12-16

## 2023-12-16 MED ORDER — INSULIN ASPART 100 UNIT/ML IV SOLN
15.0000 [IU] | Freq: Once | INTRAVENOUS | Status: AC
  Administered 2023-12-16: 15 [IU] via INTRAVENOUS

## 2023-12-16 MED ORDER — INSULIN ASPART 100 UNIT/ML IV SOLN
15.0000 [IU] | Freq: Once | INTRAVENOUS | Status: AC
  Administered 2023-12-16: 15 [IU] via SUBCUTANEOUS

## 2023-12-16 MED ORDER — METFORMIN HCL ER 750 MG PO TB24: 750.0000 mg | ORAL_TABLET | Freq: Every day | ORAL | 0 refills | Status: DC

## 2023-12-16 NOTE — ED Notes (Signed)
 Pt given cup of water

## 2023-12-16 NOTE — ED Provider Notes (Signed)
 [place] [facility] AT [facility] Provider Note   CSN: [id number] Arrival date & time: 12/16/23  1118     History  Chief Complaint  Patient presents with   Hyperglycemia    Richard Vazquez is a 68 y.o. male.  68 yo M with a chief complaints of high blood sugar.  Said he just did not feel right today and so he borrowed his wife's glucometer and checked his blood sugar.  Was over 400 and then decided to come to the ED for evaluation.  He says he has been very thirsty and has been urinating a lot.  He denies cough congestion fevers chills or myalgias denies abdominal pain he denies nausea vomiting.  He has had a little bit of loose stools he thought that was may be due to his recent medication changes.  Was started on a different dose of torsemide  and has been taking more potassium.  He denies any new rash or skin lesions.   Hyperglycemia      Home Medications Prior to Admission medications   Medication Sig Start Date End Date Taking? Authorizing Provider  metFORMIN  (GLUCOPHAGE -XR) 750 MG 24 hr tablet Take 1 tablet (750 mg total) by mouth daily with breakfast. 12/16/23  Yes Albertus Hughs, DO  bisacodyl  (DULCOLAX) 5 MG EC tablet Take 5 mg by mouth daily as needed for moderate constipation or severe constipation. 07/07/22   [provider]  cholecalciferol (VITAMIN D3) 25 MCG (1000 UNIT) tablet Take 1,000 Units by mouth daily. 10/12/22   [provider]  diltiazem  (CARDIZEM  CD) 240 MG 24 hr capsule TAKE 1 CAPSULE BY MOUTH DAILY 08/16/23   Debbie Fails, PA-C  ELIQUIS  5 MG TABS tablet TAKE 1 TABLET BY MOUTH TWICE A DAY 09/17/23   Elmyra Haggard, MD  indapamide  (LOZOL ) 2.5 MG tablet Take 2.5 mg by mouth daily. 10/21/21   [provider]  Iron, Ferrous Sulfate , 325 (65 Fe) MG TABS Take 65 mg by mouth every other day.    [provider]  Multiple Vitamin (MULTIVITAMIN) tablet Take 1 tablet by mouth daily.    [provider]   nitroGLYCERIN  (NITROSTAT ) 0.4 MG SL tablet Place 1 tablet (0.4 mg total) under the tongue every 5 (five) minutes as needed for chest pain (Max 3 doses). 09/25/22   Elmyra Haggard, MD  Omega-3 1000 MG CAPS Take 1,000 mg by mouth daily.    [provider]  polyethylene glycol (MIRALAX / GLYCOLAX) 17 g packet Take 17 g by mouth daily.    [provider]  potassium chloride  SA (KLOR-CON  M) 20 MEQ tablet Take 1 tablet (20 mEq total) by mouth daily. 11/23/23   Verona Goodwill, MD  rosuvastatin  (CRESTOR ) 20 MG tablet TAKE 1 TABLET BY MOUTH DAILY 08/10/23   Elmyra Haggard, MD  tamsulosin (FLOMAX) 0.4 MG CAPS capsule Take 0.4 mg by mouth daily. 09/04/22   [provider]  torsemide  (DEMADEX ) 20 MG tablet Take 2 tablets (40 mg total) by mouth every other day. Patient taking differently: Take 20 mg by mouth daily. 10/30/23   Verona Goodwill, MD  triamcinolone  cream (KENALOG ) 0.1 % Apply 1 Application topically 2 (two) times daily. Patient taking differently: Apply 1 Application topically 2 (two) times daily as needed (Rash). 06/19/23   Catheryn Cluck, MD      Allergies    Codeine, Latex, and Lipitor [atorvastatin]    Review of Systems   Review of Systems  Physical  Exam Updated Vital Signs BP 128/71   Pulse 79   Temp 98.6 F (37 C) (Oral)   Resp 20   Ht 6\' 3"  (1.905 m)   Wt 132.5 kg   SpO2 95%   BMI 36.50 kg/m  Physical Exam Vitals and nursing note reviewed.  Constitutional:      Appearance: He is well-developed.  HENT:     Head: Normocephalic and atraumatic.  Eyes:     Pupils: Pupils are equal, round, and reactive to light.  Neck:     Vascular: No JVD.  Cardiovascular:     Rate and Rhythm: Normal rate and regular rhythm.     Heart sounds: No murmur heard.    No friction rub. No gallop.  Pulmonary:     Effort: No respiratory distress.     Breath sounds: No wheezing.  Abdominal:     General: There is no distension.     Tenderness: There is no abdominal  tenderness. There is no guarding or rebound.  Musculoskeletal:        General: Normal range of motion.     Cervical back: Normal range of motion and neck supple.  Skin:    Coloration: Skin is not pale.     Findings: No rash.  Neurological:     Mental Status: He is alert and oriented to person, place, and time.  Psychiatric:        Behavior: Behavior normal.     ED Results / Procedures / Treatments   Labs (all labs ordered are listed, but only abnormal results are displayed) Labs Reviewed  COMPREHENSIVE METABOLIC PANEL WITH GFR - Abnormal; Notable for the following components:      Result Value   Sodium 134 (*)    Chloride 90 (*)    Glucose, Bld 576 (*)    BUN 27 (*)    Creatinine, Ser 1.36 (*)    AST 45 (*)    GFR, Estimated 57 (*)    All other components within normal limits  URINALYSIS, ROUTINE W REFLEX MICROSCOPIC - Abnormal; Notable for the following components:   Glucose, UA >=500 (*)    Hgb urine dipstick TRACE (*)    All other components within normal limits  URINALYSIS, MICROSCOPIC (REFLEX) - Abnormal; Notable for the following components:   Bacteria, UA RARE (*)    All other components within normal limits  CBG MONITORING, ED - Abnormal; Notable for the following components:   Glucose-Capillary 495 (*)    All other components within normal limits  CBG MONITORING, ED - Abnormal; Notable for the following components:   Glucose-Capillary 575 (*)    All other components within normal limits  CBG MONITORING, ED - Abnormal; Notable for the following components:   Glucose-Capillary 574 (*)    All other components within normal limits  CBG MONITORING, ED - Abnormal; Notable for the following components:   Glucose-Capillary 428 (*)    All other components within normal limits  CBC WITH DIFFERENTIAL/PLATELET    EKG None  Radiology No results found.  Procedures Procedures    Medications Ordered in ED Medications  insulin  aspart (novoLOG ) injection 15 Units  (15 Units Subcutaneous Given 12/16/23 1229)  insulin  aspart (novoLOG ) injection 15 Units (15 Units Subcutaneous Given 12/16/23 1318)  sodium chloride  0.9 % bolus 500 mL (0 mLs Intravenous Stopped 12/16/23 1409)  sodium chloride  0.9 % bolus 500 mL (500 mLs Intravenous New Bag/Given 12/16/23 1358)  insulin  aspart (novoLOG ) injection 15 Units (15 Units Intravenous Given  12/16/23 1356)    ED Course/ Medical Decision Making/ A&P                                 Medical Decision Making Amount and/or Complexity of Data Reviewed Labs: ordered.  Risk OTC drugs. Prescription drug management.   68 yo M with a chief complaints of hyperglycemia.  Patient felt funny today and so he used his wife's glucometer and found that his blood sugar was over 400.  495 on arrival here.  Will obtain lab work to screen for diabetic ketoacidosis.  Patient has been having issues with fluid retention we will hold off on IV fluids at this time.  Patient's metabolic panel with elevated blood sugar.  Higher than initial point-of-care test.  Repeat CBG also elevated.  The patient was given 2 bolus doses of insulin  without significant improvement.  Was given IV fluids.  Case was discussed with internal medicine did not feel that intravenous insulin  would make sense next.  Patient has had some improvement of his blood sugar now.  Continues to be over 400.  I am bit apprehensive to give him more insulin .  Is gotten 45 units in the past couple hours.  At this point he has no specific complaints.  Is not in diabetic ketoacidosis.  Will start on metformin .  He has follow-up in 48 hours.  Will have him return for any worsening.  2:43 PM:  I have discussed the diagnosis/risks/treatment options with the patient and family.  Evaluation and diagnostic testing in the emergency department does not suggest an emergent condition requiring admission or immediate intervention beyond what has been performed at this time.  They will follow up with  PCP. We also discussed returning to the ED immediately if new or worsening sx occur. We discussed the sx which are most concerning (e.g., sudden worsening pain, fever, inability to tolerate by mouth) that necessitate immediate return. Medications administered to the patient during their visit and any new prescriptions provided to the patient are listed below.  Medications given during this visit Medications  insulin  aspart (novoLOG ) injection 15 Units (15 Units Subcutaneous Given 12/16/23 1229)  insulin  aspart (novoLOG ) injection 15 Units (15 Units Subcutaneous Given 12/16/23 1318)  sodium chloride  0.9 % bolus 500 mL (0 mLs Intravenous Stopped 12/16/23 1409)  sodium chloride  0.9 % bolus 500 mL (500 mLs Intravenous New Bag/Given 12/16/23 1358)  insulin  aspart (novoLOG ) injection 15 Units (15 Units Intravenous Given 12/16/23 1356)     The patient appears reasonably screen and/or stabilized for discharge and I doubt any other medical condition or other [facility] requiring further screening, evaluation, or treatment in the ED at this time prior to discharge.          Final Clinical Impression(s) / ED Diagnoses Final diagnoses:  Hyperglycemia    Rx / DC Orders ED Discharge Orders          Ordered    metFORMIN  (GLUCOPHAGE -XR) 750 MG 24 hr tablet  Daily with breakfast        12/16/23 1438              Albertus Hughs, DO 12/16/23 1443

## 2023-12-16 NOTE — Discharge Instructions (Signed)
 Take your medication as prescribed.  Follow up with your doctor in 48 hours, but call them tomorrow.  Return for abdominal pain, confusion.

## 2023-12-16 NOTE — ED Triage Notes (Signed)
 Pt sts glucose was 400 at home this morning; does not take diabetes medication, sts he has not been officially diagnosed

## 2023-12-16 NOTE — ED Notes (Signed)
 Pt alert and oriented X 4 at the time of discharge. RR even and unlabored. No acute distress noted. Pt verbalized understanding of discharge instructions as discussed. Pt ambulatory to lobby at time of discharge.

## 2023-12-17 ENCOUNTER — Encounter: Payer: Self-pay | Admitting: Family Medicine

## 2023-12-17 ENCOUNTER — Ambulatory Visit (INDEPENDENT_AMBULATORY_CARE_PROVIDER_SITE_OTHER): Admitting: Family Medicine

## 2023-12-17 ENCOUNTER — Telehealth: Payer: Self-pay

## 2023-12-17 ENCOUNTER — Ambulatory Visit: Payer: Self-pay

## 2023-12-17 VITALS — BP 118/76 | HR 77 | Temp 97.4°F | Wt 288.0 lb

## 2023-12-17 DIAGNOSIS — Z7984 Long term (current) use of oral hypoglycemic drugs: Secondary | ICD-10-CM | POA: Diagnosis not present

## 2023-12-17 DIAGNOSIS — I4892 Unspecified atrial flutter: Secondary | ICD-10-CM

## 2023-12-17 DIAGNOSIS — E1165 Type 2 diabetes mellitus with hyperglycemia: Secondary | ICD-10-CM | POA: Diagnosis not present

## 2023-12-17 DIAGNOSIS — E876 Hypokalemia: Secondary | ICD-10-CM | POA: Diagnosis not present

## 2023-12-17 DIAGNOSIS — R739 Hyperglycemia, unspecified: Secondary | ICD-10-CM

## 2023-12-17 LAB — GLUCOSE, POCT (MANUAL RESULT ENTRY): POC Glucose: 399 mg/dL — AB (ref 70–99)

## 2023-12-17 LAB — POCT GLYCOSYLATED HEMOGLOBIN (HGB A1C): Hemoglobin A1C: 12 % — AB (ref 4.0–5.6)

## 2023-12-17 MED ORDER — BLOOD GLUCOSE MONITORING SUPPL DEVI: 1.0000 | Freq: Three times a day (TID) | 0 refills | Status: DC

## 2023-12-17 MED ORDER — METFORMIN HCL ER 750 MG PO TB24: 1500.0000 mg | ORAL_TABLET | Freq: Every day | ORAL | 0 refills | Status: DC

## 2023-12-17 MED ORDER — LANCET DEVICE MISC: 1.0000 | Freq: Three times a day (TID) | 0 refills | Status: AC

## 2023-12-17 MED ORDER — LANCETS MISC. MISC: 1.0000 | Freq: Three times a day (TID) | 0 refills | Status: DC

## 2023-12-17 MED ORDER — BLOOD GLUCOSE TEST VI STRP: 1.0000 | ORAL_STRIP | Freq: Three times a day (TID) | 0 refills | Status: DC

## 2023-12-17 NOTE — Patient Instructions (Signed)
 VISIT SUMMARY:  Today, we discussed your recent high blood sugar levels and your visit to the emergency department. We reviewed your current medications and lifestyle changes, and we made adjustments to your diabetes management plan to help lower your blood sugar levels before your upcoming atrial fibrillation procedure.  YOUR PLAN:  -TYPE 2 DIABETES MELLITUS WITH HYPERGLYCEMIA: Your blood sugar levels have been very high, which is a sign of poor control of your diabetes. We have increased your metformin  dose to 1500 mg once daily and will monitor your fasting blood glucose levels daily. We also discussed the potential need for additional medications like GLP-1 receptor agonists, which can help with weight loss and heart health. You will be referred to a pharmacy team for diabetes management support and to a dietitian for dietary and lifestyle counseling. Please continue your exercise and dietary efforts, and we will reassess your progress in two weeks.  -ATRIAL FIBRILLATION: You have an upcoming procedure for your atrial fibrillation on May 30th. We need to ensure your blood sugar levels are under control before this procedure. We will coordinate with your cardiologist to determine if the procedure needs to be postponed based on your diabetes management.  -HYPOKALEMIA: Your potassium levels, which were previously low, have improved with supplementation. Since you are on diuretics that can lower potassium levels, you should continue your current potassium supplements as prescribed by your cardiologist. We will monitor your potassium levels as needed.  INSTRUCTIONS:  Please monitor your fasting blood glucose levels daily and keep a record. Continue taking your medications as prescribed, including the increased dose of metformin . You will be referred to a pharmacy team and a dietitian for additional support. We will have a follow-up appointment in two weeks to assess your response to the treatment.  Additionally, we will coordinate with your cardiologist regarding your upcoming atrial fibrillation procedure to ensure it is safe to proceed.

## 2023-12-17 NOTE — Transitions of Care (Post Inpatient/ED Visit) (Signed)
 12/17/2023  Name: Richard Vazquez MRN: [id number] DOB: 05-19-1956  Today's TOC FU Call Status: Today's TOC FU Call Status:: Successful TOC FU Call Completed TOC FU Call Complete Date: 12/17/23 Patient's Name and Date of Birth confirmed.  Transition Care Management Follow-up Telephone Call Date of Discharge: 12/16/23 Discharge Facility: [facility] Type of Discharge: Emergency Department Reason for ED Visit: Other: (Hyperglycemia) How have you been since you were released from the hospital?: Better Any questions or concerns?: No (pt states she has picked up and started the Metformin , he will keep a check on glucose readings and call back if they aren't imporiving or he starts to feel bad)  Items Reviewed: Did you receive and understand the discharge instructions provided?: Yes Medications obtained,verified, and reconciled?: Yes (Medications Reviewed) Any new allergies since your discharge?: No Dietary orders reviewed?: NA Do you have support at home?: Yes  Medications Reviewed Today: Medications Reviewed Today     Reviewed by Chauncy Coral, CMA (Certified Medical Assistant) on 12/17/23 at 1044  Med List Status: <None>   Medication Order Taking? Sig Documenting Provider Last Dose Status Informant  bisacodyl  (DULCOLAX) 5 MG EC tablet [id number] No Take 5 mg by mouth daily as needed for moderate constipation or severe constipation. [provider] Taking Active Self  cholecalciferol (VITAMIN D3) 25 MCG (1000 UNIT) tablet [id number] No Take 1,000 Units by mouth daily. [provider] Taking Active Self  diltiazem  (CARDIZEM  CD) 240 MG 24 hr capsule [id number] No TAKE 1 CAPSULE BY MOUTH DAILY Debbie Fails, PA-C Taking Active Self  ELIQUIS  5 MG TABS tablet [id number] No TAKE 1 TABLET BY MOUTH TWICE A DAY Elmyra Haggard, MD Taking Active Self  indapamide  (LOZOL ) 2.5 MG tablet [id number] No Take 2.5 mg by mouth daily. [provider] Taking Active Self   Iron, Ferrous Sulfate , 325 (65 Fe) MG TABS [id number] No Take 65 mg by mouth every other day. [provider] Taking Active Self  metFORMIN  (GLUCOPHAGE -XR) 750 MG 24 hr tablet [id number]  Take 1 tablet (750 mg total) by mouth daily with breakfast. Albertus Hughs, DO  Active   Multiple Vitamin (MULTIVITAMIN) tablet [id number] No Take 1 tablet by mouth daily. [provider] Taking Active Self           Med Note (MATTHEWS, SAMANTHA A   Tue Oct 30, 2023 10:57 AM)    nitroGLYCERIN  (NITROSTAT ) 0.4 MG SL tablet [id number] No Place 1 tablet (0.4 mg total) under the tongue every 5 (five) minutes as needed for chest pain (Max 3 doses). Elmyra Haggard, MD Taking Active Self           Med Note [place] Justin   Tue Jun 12, 2023  4:13 PM)    Omega-3 1000 MG CAPS [id number] No Take 1,000 mg by mouth daily. [provider] Taking Active Self  polyethylene glycol (MIRALAX / GLYCOLAX) 17 g packet [id number] No Take 17 g by mouth daily. [provider] Taking Active Self  potassium chloride  SA (KLOR-CON  M) 20 MEQ tablet [id number]  Take 1 tablet (20 mEq total) by mouth daily. Verona Goodwill, MD  Active   rosuvastatin  (CRESTOR ) 20 MG tablet [id number] No TAKE 1 TABLET BY MOUTH DAILY Elmyra Haggard, MD Taking Active Self  tamsulosin (FLOMAX) 0.4 MG CAPS capsule [id number] No Take 0.4 mg by mouth daily. [provider] Taking Active Self  torsemide  (DEMADEX ) 20 MG tablet [id number] No Take 2 tablets (40 mg total) by mouth every  other day.  Patient taking differently: Take 20 mg by mouth daily.   Verona Goodwill, MD Taking Active Self  triamcinolone  cream (KENALOG ) 0.1 % [id number] No Apply 1 Application topically 2 (two) times daily.  Patient taking differently: Apply 1 Application topically 2 (two) times daily as needed (Rash).   Catheryn Cluck, MD Taking Active Self            Home Care and Equipment/Supplies: Were Home Health Services Ordered?: NA Any new equipment or  medical supplies ordered?: NA  Functional Questionnaire: Do you need assistance with bathing/showering or dressing?: No Do you need assistance with meal preparation?: No Do you need assistance with eating?: No Do you have difficulty maintaining continence: No Do you need assistance with getting out of bed/getting out of a chair/moving?: No Do you have difficulty managing or taking your medications?: No  Follow up appointments reviewed: PCP Follow-up appointment confirmed?: Yes Date of PCP follow-up appointment?: 12/18/23 Follow-up Provider: Dr. Hildy Lowers [facility] Follow-up appointment confirmed?: NA Do you need transportation to your follow-up appointment?: No Do you understand care options if your condition(s) worsen?: Yes-patient verbalized understanding    SIGNATURE Germain Kohler, CMA (AAMA)  [facility] [phone]

## 2023-12-17 NOTE — Telephone Encounter (Signed)
 Noted. Scheduled with PCP at 3:40pm today

## 2023-12-17 NOTE — Telephone Encounter (Signed)
 Chief Complaint: Hyperglycemia Symptoms: frequent urination Frequency: In ED yesterday Pertinent Negatives: Patient denies N/V, abd pain, vision changes Disposition: [] ED /[] Urgent Care (no appt availability in office) / [x] Appointment(In office/virtual)/ []  [place] [facility]/ [] Home Care/ [] Refused Recommended Disposition /[] [place]/ []  Follow-up with PCP Additional Notes: Pt notes he was feeling lightheaded yesterday and was seen in ED, BG was in the 400's, pt given 45 u of insulin  down to 428 and pt was discharged and advised to FU. BG today 436 at the time of call, pt began Metformin  this AM as prescribed by ED provider. OV rescheduled for today. This RN educated pt on home care, new-worsening symptoms, when to call back/seek emergent care. Pt verbalized understanding and agrees to plan.    Copied from CRM [id number]. Topic: Clinical - Red Word Triage >> Dec 17, 2023  2:26 PM Aisha D wrote: Red Word that prompted transfer to Nurse Triage: High blood sugar  Patient stated that he was speaking with a nurse earlier and was advised to call back if blood sugar gets high. Patient stated that he took the Metformin  this morning at 10:30am but that doesn't seem to work.Patient stated that his BS is now at 436. Reason for Disposition  New-onset diabetes suspected (e.g., abnormally thirsty, frequent urination, weight loss)  Answer Assessment - Initial Assessment Questions 1. BLOOD GLUCOSE: "What is your blood glucose level?"      436 2. ONSET: "When did you check the blood glucose?"     Just now   5. TYPE 1 or 2:  "Do you know what type of diabetes you have?"  (e.g., Type 1, Type 2, Gestational; doesn't know)      Not diagnosed 6. INSULIN : "Do you take insulin ?" "What type of insulin (s) do you use? What is the mode of delivery? (syringe, pen; injection or pump)?"      None 7. DIABETES PILLS: "Do you take any pills for your diabetes?" If Yes, ask: "Have you missed taking any  pills recently?"     Metformin  8. OTHER SYMPTOMS: "Do you have any symptoms?" (e.g., fever, frequent urination, difficulty breathing, dizziness, weakness, vomiting)     Frequent urination  Protocols used: Diabetes - High Blood Sugar-A-AH

## 2023-12-17 NOTE — Progress Notes (Signed)
 Assessment/Plan:   Assessment & Plan Type 2 diabetes mellitus with hyperglycemia Acute exacerbation with significantly elevated blood glucose levels, reaching 575 mg/dL. Hemoglobin A1c is 12%, indicating poor glycemic control. Reports increased thirst and urination due to hyperglycemia. Initiated on metformin  750 mg extended-release, with no gastrointestinal side effects. Discussed potential need for insulin  therapy versus maximizing metformin  and considering GLP-1 receptor agonists (e.g., Ozempic, Mounjaro) for additional benefits including weight loss and cardiovascular protection. Emphasized aggressive management to lower blood glucose levels before atrial fibrillation procedure on May 30th. Discussed importance of lifestyle modifications, including exercise and dietary changes, in conjunction with medication. - Increase metformin  to 1500 mg once daily (two 750 mg extended-release tablets). - Monitor fasting blood glucose levels daily. - Refer to pharmacy team for diabetes management support. - Refer to dietitian for dietary and lifestyle counseling. - Schedule follow-up appointment in two weeks to assess response to treatment.  Atrial fibrillation Scheduled for procedure on May 30th. Concerns about impact of uncontrolled diabetes on procedure. Coordination with cardiology to determine if procedure needs postponement based on glycemic control. - Coordinate with cardiology regarding impact of diabetes management on scheduled atrial fibrillation procedure.  Hypokalemia Previously low potassium levels improved to 3.9 mmol/L with supplementation. On diuretics which may contribute to hypokalemia. Advised to continue supplementation as prescribed by cardiology. - Continue current potassium supplementation as prescribed by cardiology. - Monitor potassium levels as needed, in coordination with cardiology.      Medications Discontinued During This Encounter  Medication Reason   metFORMIN   (GLUCOPHAGE -XR) 750 MG 24 hr tablet     Return in about 2 weeks (around 12/31/2023) for DM.    Subjective:   Encounter date: 12/17/2023  Tishawn Friedhoff is a 68 y.o. male who has Atrial flutter (HCC); Hyperlipemia; Hematuria; Carotid artery disease (HCC); Persistent atrial fibrillation (HCC); CAD (coronary artery disease); Shift work sleep disorder; OSA (obstructive sleep apnea); Class 2 severe obesity due to excess calories with serious comorbidity and body mass index (BMI) of 38.0 to 38.9 in adult [facility]); Renal stones; Prediabetes; Chronic constipation; Rash; Dyspnea on exertion; Bilateral edema of lower extremity; Vitamin D  deficiency; Psoriasis; Venous stasis of both lower extremities; Varicose veins of left lower extremity with pain; Absolute anemia; Benign prostatic hyperplasia with urinary frequency; and Primary hypertension on their problem list..   He  has a past medical history of Acute pain of right knee (05/16/2022), Atrial fibrillation (HCC), Carotid artery disease (HCC), Colon polyps, COPD (chronic obstructive pulmonary disease) (HCC), Former smoker (12/21/2015), Hyperlipemia, Hypertension (Not sure), Kidney stones, Left carotid bruit (08/05/2021), Obesity, Paroxysmal atrial flutter (HCC) (05/29/2021), Sleep apnea, and Sleep apnea treated with nocturnal bilevel positive airway pressure (BPAP).Diron Haddon Aas   He presents with chief complaint of Follow-up (Hyperglycemia. Glucose reading at home was 438 2:30 pm ) .   Discussed the use of AI scribe software for clinical note transcription with the patient, who gave verbal consent to proceed.  History of Present Illness Bijan Ridgley is a 68 year old male with diabetes who presents with high blood sugar levels.  He visited the emergency department the previous day due to a blood sugar reading of 575 mg/dL. Insulin  was administered at the hospital, which reduced his blood sugar levels, and he was started on metformin  750 mg extended release, which he began  taking the morning of the visit. No gastrointestinal side effects from the metformin  have been noted thus far.  He describes feeling 'a little fuzzy' and lightheaded, with persistent thirst and frequent urination,  which he attributes in part to his diuretic medication, torsemide , taken once daily. He also takes amlodipine and indapamide . His blood sugar was recorded at 399 mg/dL during the visit, and he mentions a previous hemoglobin A1c of 6.4% in October, which was at a prediabetic level. However, his current A1c is 12%, indicating a significant increase.  He has been actively trying to manage his diabetes through lifestyle changes, including using an exercise bike for 30 minutes daily and monitoring his diet by avoiding sugar and reducing carbohydrate intake. He notes a weight loss from 297 lbs in March to 288 lbs at the time of the visit. Despite these efforts, he is frustrated that his blood sugar levels have increased.  He is also on potassium supplements, specifically Chlorcon, due to previous low potassium levels, which have since normalized to 3.9 mmol/L. He is unsure about the duration of this treatment as there were no refills on the prescription.  He has a history of atrial fibrillation and is scheduled for a related procedure on May 30th. He is concerned about how his current high blood sugar levels might impact this upcoming procedure.     Review of Systems  Genitourinary:  Positive for frequency.  Endo/Heme/Allergies:  Positive for polydipsia.  All other systems reviewed and are negative.   Past Surgical History:  Procedure Laterality Date   A-FLUTTER ABLATION N/A 01/16/2022   Procedure: A-FLUTTER ABLATION;  Surgeon: Verona Goodwill, MD;  Location: [facility] INVASIVE CV LAB;  Service: Cardiovascular;  Laterality: N/A;   APPENDECTOMY     CARDIOVERSION N/A 05/31/2021   Procedure: CARDIOVERSION;  Surgeon: Hazle Lites, MD;  Location: [facility] ENDOSCOPY;  Service: Cardiovascular;   Laterality: N/A;   CARDIOVERSION N/A 10/05/2021   Procedure: CARDIOVERSION;  Surgeon: Hazle Lites, MD;  Location: [facility] ENDOSCOPY;  Service: Cardiovascular;  Laterality: N/A;   CARDIOVERSION N/A 11/01/2023   Procedure: CARDIOVERSION;  Surgeon: Jann Melody, MD;  Location: [facility] INVASIVE CV LAB;  Service: Cardiovascular;  Laterality: N/A;   COLONOSCOPY     LEFT HEART CATH AND CORONARY ANGIOGRAPHY N/A 12/07/2021   Procedure: LEFT HEART CATH AND CORONARY ANGIOGRAPHY;  Surgeon: Kyra Phy, MD;  Location: [facility] INVASIVE CV LAB;  Service: Cardiovascular;  Laterality: N/A;   LITHOTRIPSY     multiple   TEE WITHOUT CARDIOVERSION N/A 05/31/2021   Procedure: TRANSESOPHAGEAL ECHOCARDIOGRAM (TEE);  Surgeon: Hazle Lites, MD;  Location: [facility] ENDOSCOPY;  Service: Cardiovascular;  Laterality: N/A;    Outpatient Medications Prior to Visit  Medication Sig Dispense Refill   bisacodyl  (DULCOLAX) 5 MG EC tablet Take 5 mg by mouth daily as needed for moderate constipation or severe constipation.     cholecalciferol (VITAMIN D3) 25 MCG (1000 UNIT) tablet Take 1,000 Units by mouth daily.     diltiazem  (CARDIZEM  CD) 240 MG 24 hr capsule TAKE 1 CAPSULE BY MOUTH DAILY 90 capsule 3   ELIQUIS  5 MG TABS tablet TAKE 1 TABLET BY MOUTH TWICE A DAY 180 tablet 3   indapamide  (LOZOL ) 2.5 MG tablet Take 2.5 mg by mouth daily.     Iron, Ferrous Sulfate , 325 (65 Fe) MG TABS Take 65 mg by mouth every other day.     Multiple Vitamin (MULTIVITAMIN) tablet Take 1 tablet by mouth daily.     nitroGLYCERIN  (NITROSTAT ) 0.4 MG SL tablet Place 1 tablet (0.4 mg total) under the tongue every 5 (five) minutes as needed for chest pain (Max 3 doses). 25 tablet 2   Omega-3 1000 MG  CAPS Take 1,000 mg by mouth daily.     polyethylene glycol (MIRALAX / GLYCOLAX) 17 g packet Take 17 g by mouth daily.     potassium chloride  SA (KLOR-CON  M) 20 MEQ tablet Take 1 tablet (20 mEq total) by mouth daily. (Patient taking differently: Take 20  mEq by mouth 2 (two) times daily.) 90 tablet 1   rosuvastatin  (CRESTOR ) 20 MG tablet TAKE 1 TABLET BY MOUTH DAILY 90 tablet 3   tamsulosin (FLOMAX) 0.4 MG CAPS capsule Take 0.4 mg by mouth daily.     torsemide  (DEMADEX ) 20 MG tablet Take 2 tablets (40 mg total) by mouth every other day. (Patient taking differently: Take 20 mg by mouth daily.) 90 tablet 1   triamcinolone  cream (KENALOG ) 0.1 % Apply 1 Application topically 2 (two) times daily. (Patient taking differently: Apply 1 Application topically 2 (two) times daily as needed (Rash).) 30 g 0   metFORMIN  (GLUCOPHAGE -XR) 750 MG 24 hr tablet Take 1 tablet (750 mg total) by mouth daily with breakfast. 30 tablet 0   No facility-administered medications prior to visit.    Family History  Problem Relation Age of Onset   Diabetes Mother    Cancer Mother    Heart disease Mother    Hyperlipidemia Mother    Hypertension Mother    Miscarriages / [place] Mother    Stroke Mother    Varicose Veins Mother    Heart disease Father        smoker   Aortic stenosis Father    Hyperlipidemia Father    Hypertension Father    Ovarian cancer Sister    Colon cancer Paternal Uncle    Aneurysm Paternal Uncle        Brain   Cancer Paternal Uncle        all in abdomin, ? colon cancer   Asthma Sister    Cancer Paternal Uncle    Early death Paternal Aunt    Early death Paternal Uncle    Stomach cancer Neg Hx    Rectal cancer Neg Hx     Social History   Socioeconomic History   Marital status: Married    Spouse name: Not on file   Number of children: 2   Years of education: Not on file   Highest education level: 12th grade  Occupational History   Occupation: retired  Tobacco Use   Smoking status: Former    Current packs/day: 0.00    Average packs/day: 1.5 packs/day for 40.0 years (60.0 ttl pk-yrs)    Types: Cigarettes    Start date: 12/09/1979    Quit date: 12/09/2019    Years since quitting: 4.0    Passive exposure: Past   Smokeless  tobacco: Never  Vaping Use   Vaping status: Never Used  Substance and Sexual Activity   Alcohol use: Not Currently   Drug use: Never   Sexual activity: Not Currently  Other Topics Concern   Not on file  Social History Narrative   Not on file   Social Drivers of Health   Financial Resource Strain: Low Risk  (12/11/2023)   Overall Financial Resource Strain (CARDIA)    Difficulty of Paying Living Expenses: Not hard at all  Food Insecurity: No Food Insecurity (12/11/2023)   Hunger Vital Sign    Worried About Running Out of Food in the Last Year: Never true    Ran Out of Food in the Last Year: Never true  Transportation Needs: No Transportation Needs (12/11/2023)   PRAPARE -  Administrator, Civil Service (Medical): No    Lack of Transportation (Non-Medical): No  Physical Activity: Sufficiently Active (12/11/2023)   Exercise Vital Sign    Days of Exercise per Week: 6 days    Minutes of Exercise per Session: 30 min  Stress: No Stress Concern Present (12/11/2023)   [employer] of Occupational Health - Occupational Stress Questionnaire    Feeling of Stress : Not at all  Social Connections: Unknown (12/11/2023)   Social Connection and Isolation Panel [NHANES]    Frequency of Communication with Friends and Family: Patient declined    Frequency of Social Gatherings with Friends and Family: Patient declined    Attends Religious Services: Never    Database administrator or Organizations: No    Attends Banker Meetings: Patient declined    Marital Status: Married  Catering manager Violence: Not At Risk (04/16/2023)   Humiliation, Afraid, Rape, and Kick questionnaire    Fear of Current or Ex-Partner: No    Emotionally Abused: No    Physically Abused: No    Sexually Abused: No                                                                                                  Objective:  Physical Exam: BP 118/76   Pulse 77   Temp (!) 97.4 F (36.3 C)  (Temporal)   Wt 288 lb (130.6 kg)   SpO2 98%   BMI 36.00 kg/m   Wt Readings from Last 3 Encounters:  12/17/23 288 lb (130.6 kg)  12/16/23 292 lb (132.5 kg)  11/20/23 297 lb (134.7 kg)    Physical Exam  GENERAL: Alert, cooperative, well developed, no acute distress HEENT: Normocephalic, normal oropharynx, moist mucous membranes CHEST: Clear to auscultation bilaterally, no wheezes, rhonchi, or crackles CARDIOVASCULAR: Normal heart rate and rhythm, S1 and S2 normal without murmurs ABDOMEN: Soft, non-tender, non-distended, without organomegaly, normal bowel sounds EXTREMITIES: No cyanosis.  Edema controlled with compression stockings NEUROLOGICAL: Cranial nerves grossly intact, moves all extremities without gross motor or sensory deficit     ECHOCARDIOGRAM COMPLETE Result Date: 11/22/2023    ECHOCARDIOGRAM REPORT   Patient Name:   [facility]    Date of Exam: 11/22/2023 Medical Rec #:  [id number]     Height:       75.0 in Accession #:    [id number]    Weight:       297.0 lb Date of Birth:  [birth year]     BSA:          2.596 m Patient Age:    67 years      BP:           126/72 mmHg Patient Gender: M             HR:           86 bpm. Exam Location:  [address] Procedure: 2D Echo and Intracardiac Opacification Agent (Both Spectral and Color            Flow Doppler were utilized during procedure). Indications:  I50.9* Heart failure (unspecified)  History:        Patient has prior history of Echocardiogram examinations, most                 recent 12/06/2021. COPD, Arrythmias:Atrial Fibrillation and                 Atrial Flutter; Risk Factors:Former Smoker, Sleep Apnea and                 Dyslipidemia. Carotid artery disease. A-flutter ablation.  Sonographer:    Mylinda Asa RCS Referring Phys: [phone] Verona Goodwill IMPRESSIONS  1. Left ventricular ejection fraction, by estimation, is 60 to 65%. The left ventricle has normal function. The left ventricle has no regional wall motion abnormalities.  There is mild concentric left ventricular hypertrophy. Left ventricular diastolic parameters are consistent with Grade I diastolic dysfunction (impaired relaxation).  2. Right ventricular systolic function is normal. The right ventricular size is normal. Tricuspid regurgitation signal is inadequate for assessing PA pressure.  3. The mitral valve is normal in structure. No evidence of mitral valve regurgitation. No evidence of mitral stenosis.  4. The aortic valve is tricuspid. There is mild calcification of the aortic valve. Aortic valve regurgitation is not visualized. No aortic stenosis is present.  5. Aortic dilatation noted. There is mild dilatation of the aortic root, measuring 39 mm.  6. The IVC is not well-visualized. FINDINGS  Left Ventricle: Left ventricular ejection fraction, by estimation, is 60 to 65%. The left ventricle has normal function. The left ventricle has no regional wall motion abnormalities. The left ventricular internal cavity size was normal in size. There is  mild concentric left ventricular hypertrophy. Left ventricular diastolic parameters are consistent with Grade I diastolic dysfunction (impaired relaxation). Right Ventricle: The right ventricular size is normal. No increase in right ventricular wall thickness. Right ventricular systolic function is normal. Tricuspid regurgitation signal is inadequate for assessing PA pressure. Left Atrium: Left atrial size was normal in size. Right Atrium: Right atrial size was normal in size. Pericardium: There is no evidence of pericardial effusion. Mitral Valve: The mitral valve is normal in structure. No evidence of mitral valve regurgitation. No evidence of mitral valve stenosis. Tricuspid Valve: The tricuspid valve is normal in structure. Tricuspid valve regurgitation is not demonstrated. Aortic Valve: The aortic valve is tricuspid. There is mild calcification of the aortic valve. Aortic valve regurgitation is not visualized. No aortic stenosis  is present. Pulmonic Valve: The pulmonic valve was normal in structure. Pulmonic valve regurgitation is not visualized. Aorta: Aortic dilatation noted. There is mild dilatation of the aortic root, measuring 39 mm. Venous: The IVC is not well-visualized. IAS/Shunts: No atrial level shunt detected by color flow Doppler.  LEFT VENTRICLE PLAX 2D LVIDd:         3.90 cm   Diastology LVIDs:         2.70 cm   LV e' medial:    5.37 cm/s LV PW:         1.00 cm   LV E/e' medial:  9.4 LV IVS:        1.00 cm   LV e' lateral:   7.83 cm/s LVOT diam:     2.20 cm   LV E/e' lateral: 6.5 LV SV:         83 LV SV Index:   32 LVOT Area:     3.80 cm  RIGHT VENTRICLE RV S prime:     13.43 cm/s TAPSE (  M-mode): 1.9 cm LEFT ATRIUM             Index LA diam:        5.00 cm 1.93 cm/m LA Vol (A2C):   27.0 ml 10.40 ml/m LA Vol (A4C):   43.1 ml 16.60 ml/m LA Biplane Vol: 34.8 ml 13.41 ml/m  AORTIC VALVE LVOT Vmax:   132.67 cm/s LVOT Vmean:  83.333 cm/s LVOT VTI:    0.219 m  AORTA Ao Root diam: 3.90 cm Ao Asc diam:  3.60 cm MITRAL VALVE MV Area (PHT): 3.10 cm    SHUNTS MV Decel Time: 245 msec    Systemic VTI:  0.22 m MV E velocity: 50.63 cm/s  Systemic Diam: 2.20 cm MV A velocity: 73.90 cm/s MV E/A ratio:  0.69 Dalton McleanMD Electronically signed by Archer Bear Signature Date/Time: 11/22/2023/1:41:15 PM    Final    EP STUDY Result Date: 11/01/2023 See surgical note for result.  VAS US  CAROTID Result Date: 09/27/2023 Carotid Arterial Duplex Study Patient Name:  [facility]  Date of Exam:   09/26/2023 Medical Rec #: [id number]   Accession #:    [id number] Date of Birth: Nov 21, 1955   Patient Gender: M Patient Age:   25 years Exam Location:  [facility] Procedure:      VAS US  CAROTID Referring Phys: PAULA [name] --------------------------------------------------------------------------------  Indications:       Carotid artery disease and patient denies any cerebrovascular                    symptoms. Risk Factors:      Hypertension,  hyperlipidemia, past history of smoking,                    coronary artery disease. Comparison Study:  In 08/2022, a carotid duplex showed velocities of 123/46 cm/s                    in the RICA and 91/25 cm/s in the LICA. Performing Technologist: Doren Gammons RVT  Examination Guidelines: A complete evaluation includes B-mode imaging, spectral Doppler, color Doppler, and power Doppler as needed of all accessible portions of each vessel. Bilateral testing is considered an integral part of a complete examination. Limited examinations for reoccurring indications may be performed as noted.  Right Carotid Findings: +----------+--------+--------+--------+------------------+------------+           PSV cm/sEDV cm/sStenosisPlaque DescriptionComments     +----------+--------+--------+--------+------------------+------------+ CCA Prox  99      11                                             +----------+--------+--------+--------+------------------+------------+ CCA Distal68      13                                             +----------+--------+--------+--------+------------------+------------+ ICA Prox  73      13              heterogenous                   +----------+--------+--------+--------+------------------+------------+ ICA Mid   119     35      1-39%   heterogenous      decrease EDV +----------+--------+--------+--------+------------------+------------+ ICA Distal70  19                                             +----------+--------+--------+--------+------------------+------------+ ECA       106     10              heterogenous                   +----------+--------+--------+--------+------------------+------------+ +----------+--------+-------+----------------+-------------------+           PSV cm/sEDV cmsDescribe        Arm Pressure (mmHG) +----------+--------+-------+----------------+-------------------+ Subclavian170            Multiphasic,  [id number]                 +----------+--------+-------+----------------+-------------------+ +---------+--------+--+--------+--+---------+ VertebralPSV cm/s44EDV cm/s12Antegrade +---------+--------+--+--------+--+---------+  Left Carotid Findings: +----------+--------+--------+--------+------------------+--------+           PSV cm/sEDV cm/sStenosisPlaque DescriptionComments +----------+--------+--------+--------+------------------+--------+ CCA Prox  112     19                                         +----------+--------+--------+--------+------------------+--------+ CCA Distal54      12                                         +----------+--------+--------+--------+------------------+--------+ ICA Prox  55      17              heterogenous               +----------+--------+--------+--------+------------------+--------+ ICA Mid   79      23      1-39%   heterogenous               +----------+--------+--------+--------+------------------+--------+ ICA Distal77      24                                         +----------+--------+--------+--------+------------------+--------+ ECA       96      12                                         +----------+--------+--------+--------+------------------+--------+ +----------+--------+--------+----------------+-------------------+           PSV cm/sEDV cm/sDescribe        Arm Pressure (mmHG) +----------+--------+--------+----------------+-------------------+ [id number]             Multiphasic, [id number]                 +----------+--------+--------+----------------+-------------------+ +---------+--------+--+--------+-+---------+ VertebralPSV cm/s31EDV cm/s4Antegrade +---------+--------+--+--------+-+---------+   Summary: Right Carotid: Velocities in the right ICA are consistent with a 1-39% stenosis. Left Carotid: Velocities in the left ICA are consistent with a 1-39% stenosis. Vertebrals:  Bilateral  vertebral arteries demonstrate antegrade flow. Subclavians: Normal flow hemodynamics were seen in bilateral subclavian              arteries. *See table(s) above for measurements and observations. Suggest follow up study in 12 months due to plaque formation. Electronically signed by Armida Lander MD on 09/27/2023  at 4:14:48 PM.    Final     Recent Results (from the past 2160 hours)  CBC     Status: None   Collection Time: 10/30/23  1:31 PM  Result Value Ref Range   WBC 8.7 3.4 - 10.8 x10E3/uL   RBC 4.73 4.14 - 5.80 x10E6/uL   Hemoglobin 14.3 13.0 - 17.7 g/dL   Hematocrit 09.8 11.9 - 51.0 %   MCV 92 79 - 97 fL   MCH 30.2 26.6 - 33.0 pg   MCHC 32.9 31.5 - 35.7 g/dL   RDW 14.7 82.9 - 56.2 %   Platelets 233 150 - 450 x10E3/uL  Basic metabolic panel     Status: Abnormal   Collection Time: 10/30/23  1:31 PM  Result Value Ref Range   Glucose 221 (H) 70 - 99 mg/dL   BUN 21 8 - 27 mg/dL   Creatinine, Ser 1.30 0.76 - 1.27 mg/dL   eGFR 70 >86 VH/QIO/9.62   BUN/Creatinine Ratio 18 10 - 24   Sodium 142 134 - 144 mmol/L   Potassium 3.7 3.5 - 5.2 mmol/L   Chloride 101 96 - 106 mmol/L   CO2 24 20 - 29 mmol/L   Calcium  9.2 8.6 - 10.2 mg/dL  Basic metabolic panel     Status: Abnormal   Collection Time: 11/13/23 11:03 AM  Result Value Ref Range   Glucose 227 (H) 70 - 99 mg/dL   BUN 25 8 - 27 mg/dL   Creatinine, Ser 9.52 (H) 0.76 - 1.27 mg/dL   eGFR 60 >84 XL/KGM/0.10   BUN/Creatinine Ratio 19 10 - 24   Sodium 140 134 - 144 mmol/L   Potassium 3.2 (L) 3.5 - 5.2 mmol/L   Chloride 93 (L) 96 - 106 mmol/L   CO2 25 20 - 29 mmol/L   Calcium  9.5 8.6 - 10.2 mg/dL  ECHOCARDIOGRAM COMPLETE     Status: None   Collection Time: 11/22/23  1:38 PM  Result Value Ref Range   S' Lateral 2.70 cm   Area-P 1/2 3.10 cm2   Est EF 60 - 65%   Basic metabolic panel     Status: Abnormal   Collection Time: 11/23/23 10:41 AM  Result Value Ref Range   Glucose 283 (H) 70 - 99 mg/dL   BUN 24 8 - 27 mg/dL    Creatinine, Ser 2.72 (H) 0.76 - 1.27 mg/dL   eGFR 53 (L) >53 GU/YQI/3.47   BUN/Creatinine Ratio 17 10 - 24   Sodium 140 134 - 144 mmol/L   Potassium 3.5 3.5 - 5.2 mmol/L   Chloride 94 (L) 96 - 106 mmol/L   CO2 23 20 - 29 mmol/L   Calcium  9.9 8.6 - 10.2 mg/dL  CBC     Status: None   Collection Time: 11/23/23 10:46 AM  Result Value Ref Range   WBC 7.5 3.4 - 10.8 x10E3/uL   RBC 4.99 4.14 - 5.80 x10E6/uL   Hemoglobin 15.6 13.0 - 17.7 g/dL   Hematocrit 42.5 95.6 - 51.0 %   MCV 92 79 - 97 fL   MCH 31.3 26.6 - 33.0 pg   MCHC 34.1 31.5 - 35.7 g/dL   RDW 38.7 56.4 - 33.2 %   Platelets 229 150 - 450 x10E3/uL  POC CBG, ED     Status: Abnormal   Collection Time: 12/16/23 11:25 AM  Result Value Ref Range   Glucose-Capillary 495 (H) 70 - 99 mg/dL    Comment: Glucose reference range applies only to  samples taken after fasting for at least 8 hours.   Comment 1 Notify RN   CBC with Differential     Status: None   Collection Time: 12/16/23 11:32 AM  Result Value Ref Range   WBC 7.9 4.0 - 10.5 K/uL   RBC 4.96 4.22 - 5.81 MIL/uL   Hemoglobin 15.5 13.0 - 17.0 g/dL   HCT 16.1 09.6 - 04.5 %   MCV 90.1 80.0 - 100.0 fL   MCH 31.3 26.0 - 34.0 pg   MCHC 34.7 30.0 - 36.0 g/dL   RDW 40.9 81.1 - 91.4 %   Platelets 232 150 - 400 K/uL   nRBC 0.0 0.0 - 0.2 %   Neutrophils Relative % 71 %   Neutro Abs 5.6 1.7 - 7.7 K/uL   Lymphocytes Relative 14 %   Lymphs Abs 1.1 0.7 - 4.0 K/uL   Monocytes Relative 9 %   Monocytes Absolute 0.7 0.1 - 1.0 K/uL   Eosinophils Relative 4 %   Eosinophils Absolute 0.4 0.0 - 0.5 K/uL   Basophils Relative 1 %   Basophils Absolute 0.1 0.0 - 0.1 K/uL   Immature Granulocytes 1 %   Abs Immature Granulocytes 0.04 0.00 - 0.07 K/uL    Comment: Performed at [facility], [address] [place], [place] [address]  Comprehensive metabolic panel     Status: Abnormal   Collection Time: 12/16/23 11:32 AM  Result Value Ref Range   Sodium 134 (L) 135 - 145 mmol/L    Potassium 3.9 3.5 - 5.1 mmol/L   Chloride 90 (L) 98 - 111 mmol/L   CO2 30 22 - 32 mmol/L   Glucose, Bld 576 (HH) 70 - 99 mg/dL    Comment: CRITICAL RESULT CALLED TO, READ BACK BY AND VERIFIED WITH SOPHIE GOUCH, RN4/20/25 1219 MGRAMINSKI Glucose reference range applies only to samples taken after fasting for at least 8 hours.    BUN 27 (H) 8 - 23 mg/dL   Creatinine, Ser 6.21 (H) 0.61 - 1.24 mg/dL   Calcium  9.4 8.9 - 10.3 mg/dL   Total Protein 7.6 6.5 - 8.1 g/dL   Albumin 3.6 3.5 - 5.0 g/dL   AST 45 (H) 15 - 41 U/L   ALT 39 0 - 44 U/L   Alkaline Phosphatase 90 38 - 126 U/L   Total Bilirubin 1.0 0.0 - 1.2 mg/dL   GFR, Estimated 57 (L) >60 mL/min    Comment: (NOTE) Calculated using the CKD-EPI Creatinine Equation (2021)    Anion gap 14 5 - 15    Comment: Performed at [facility], [address] [place], [place] [address]  Urinalysis, Routine w reflex microscopic -Urine, Clean Catch     Status: Abnormal   Collection Time: 12/16/23 11:34 AM  Result Value Ref Range   Color, Urine YELLOW YELLOW   APPearance CLEAR CLEAR   Specific Gravity, Urine 1.010 1.005 - 1.030   pH 5.5 5.0 - 8.0   Glucose, UA >=500 (A) NEGATIVE mg/dL   Hgb urine dipstick TRACE (A) NEGATIVE   Bilirubin Urine NEGATIVE NEGATIVE   Ketones, ur NEGATIVE NEGATIVE mg/dL   Protein, ur NEGATIVE NEGATIVE mg/dL   Nitrite NEGATIVE NEGATIVE   Leukocytes,Ua NEGATIVE NEGATIVE    Comment: Performed at [facility], [address] [place], [place] [address]  Urinalysis, Microscopic (reflex)     Status: Abnormal   Collection Time: 12/16/23 11:34 AM  Result Value Ref Range   RBC /  HPF 0-5 0 - 5 RBC/hpf   WBC, UA NONE SEEN 0 - 5 WBC/hpf   Bacteria, UA RARE (A) NONE SEEN   Squamous Epithelial / HPF NONE SEEN 0 - 5 /HPF    Comment: Performed at [facility], [address] [place], [place] [address]  CBG monitoring, ED     Status: Abnormal   Collection Time: 12/16/23  1:05 PM  Result Value Ref  Range   Glucose-Capillary 575 (HH) 70 - 99 mg/dL    Comment: Glucose reference range applies only to samples taken after fasting for at least 8 hours.   Comment 1 Notify RN   CBG monitoring, ED     Status: Abnormal   Collection Time: 12/16/23  1:48 PM  Result Value Ref Range   Glucose-Capillary 574 (HH) 70 - 99 mg/dL    Comment: Glucose reference range applies only to samples taken after fasting for at least 8 hours.   Comment 1 Notify RN   CBG monitoring, ED     Status: Abnormal   Collection Time: 12/16/23  2:35 PM  Result Value Ref Range   Glucose-Capillary 428 (H) 70 - 99 mg/dL    Comment: Glucose reference range applies only to samples taken after fasting for at least 8 hours.   Comment 1 Notify RN   POCT glucose (manual entry)     Status: Abnormal   Collection Time: 12/17/23  3:50 PM  Result Value Ref Range   POC Glucose 399 (A) 70 - 99 mg/dl  POCT glycosylated hemoglobin (Hb A1C)     Status: Abnormal   Collection Time: 12/17/23  4:09 PM  Result Value Ref Range   Hemoglobin A1C 12.0 (A) 4.0 - 5.6 %   HbA1c POC (<> result, manual entry)     HbA1c, POC (prediabetic range)     HbA1c, POC (controlled diabetic range)          Carnell Christian, MD, MS

## 2023-12-18 ENCOUNTER — Telehealth: Payer: Self-pay

## 2023-12-18 ENCOUNTER — Ambulatory Visit: Payer: Self-pay

## 2023-12-18 ENCOUNTER — Ambulatory Visit: Payer: Medicare Other | Admitting: Family Medicine

## 2023-12-18 ENCOUNTER — Encounter: Attending: Family Medicine | Admitting: Skilled Nursing Facility1

## 2023-12-18 ENCOUNTER — Other Ambulatory Visit: Payer: Self-pay

## 2023-12-18 ENCOUNTER — Encounter: Payer: Self-pay | Admitting: Skilled Nursing Facility1

## 2023-12-18 ENCOUNTER — Ambulatory Visit: Admitting: Family Medicine

## 2023-12-18 VITALS — Wt 290.2 lb

## 2023-12-18 DIAGNOSIS — E1165 Type 2 diabetes mellitus with hyperglycemia: Secondary | ICD-10-CM | POA: Insufficient documentation

## 2023-12-18 DIAGNOSIS — E119 Type 2 diabetes mellitus without complications: Secondary | ICD-10-CM | POA: Diagnosis not present

## 2023-12-18 MED ORDER — ACCU-CHEK AVIVA PLUS W/DEVICE KIT
1.0000 | PACK | 0 refills | Status: DC | PRN
Start: 2023-12-18 — End: 2023-12-31

## 2023-12-18 MED ORDER — RYBELSUS 3 MG PO TABS: 3.0000 mg | ORAL_TABLET | Freq: Every day | ORAL | 0 refills | Status: DC

## 2023-12-18 MED ORDER — TORSEMIDE 20 MG PO TABS: 20.0000 mg | ORAL_TABLET | Freq: Every day | ORAL | Status: DC

## 2023-12-18 MED ORDER — POTASSIUM CHLORIDE CRYS ER 20 MEQ PO TBCR: 20.0000 meq | EXTENDED_RELEASE_TABLET | Freq: Every day | ORAL | 1 refills | Status: DC

## 2023-12-18 MED ORDER — ACCU-CHEK AVIVA PLUS VI STRP
ORAL_STRIP | 12 refills | Status: DC
Start: 2023-12-18 — End: 2023-12-31

## 2023-12-18 MED ORDER — GLIPIZIDE ER 2.5 MG PO TB24: 2.5000 mg | ORAL_TABLET | Freq: Every day | ORAL | 0 refills | Status: DC

## 2023-12-18 MED ORDER — ACCU-CHEK FASTCLIX LANCET KIT
1.0000 | PACK | Freq: Every day | 3 refills | Status: DC
Start: 2023-12-18 — End: 2024-01-08

## 2023-12-18 NOTE — Telephone Encounter (Signed)
 Copied from CRM [id number]. Topic: Clinical - Prescription Issue >> Dec 18, 2023 12:16 PM Ovid Blow wrote: Reason for CRM: Patient went to Pharmacy to pick up his medication Glucose Blood (BLOOD GLUCOSE TEST STRIPS) STRP and pharmacy could not fill prescription. Pharmacy needs clarification of which medications to fill. Dr. Nickey Barn to pick Glucose strips. Lady at [facility] stated that patient should get Ketone strips. Please contact patient regarding this matter

## 2023-12-18 NOTE — Telephone Encounter (Signed)
 Pt's pharmacy is stating that pt says he takes potassium BID. This is not how this medication is prescribe. Does pt suppose to be taking this medication BID? Please address

## 2023-12-18 NOTE — Telephone Encounter (Signed)
 Received fax from [facility] pharmacy requesting to resend Blood glucose device, lancets, and strips with brand name. They listed examples Accu chek and one touch.

## 2023-12-18 NOTE — Addendum Note (Signed)
 Addended by: Kasandra Pain B on: 12/18/2023 04:00 PM   Modules accepted: Orders

## 2023-12-18 NOTE — Addendum Note (Signed)
 Addended by: Kasandra Pain B on: 12/18/2023 12:26 PM   Modules accepted: Orders

## 2023-12-18 NOTE — Progress Notes (Signed)
 Patient was seen on 12/18/2023 for the first of a series of three diabetes self-management courses at [facility]  Patient Education Plan per assessed needs and concerns is to attend three course education program for Diabetes Self Management Education.  A1C was 12.0 on 12/17/2023.  Pt states his wife is very supportive.   Pt arrives with polydipsia: dietitian kept him hydrated throughout class and additionally advised he get ketone strips on the home after after class and if large ketones found to call his doctor. In addition to monitoring for any nausea/vomiting.    Pt reports daily 30 minute stationary bike for the last 3 months and significant dietary changes with continued blood sugars fasting over 400.    The following learning objectives were met by the patient during this class: Describe diabetes, types of diabetes and pathophysiology State some common risk factors for diabetes Defines the role of glucose and insulin  Describe the relationship between diabetes and cardiovascular and other risks State the members of the Healthcare Team States the rationale for glucose monitoring and when to test State their individual Target Range State the importance of logging glucose readings and how to interpret the readings Identifies A1C target Explain the correlation between A1c and eAG values State symptoms and treatment of high blood glucose and low blood glucose Explain proper technique for glucose testing and identify proper sharps disposal  Handouts given during class include: How to Thrive:  A Guide for Your Journey with Diabetes by the ADA Meal Plan Card and carbohydrate content list Dietary intake form Low Sodium Flavoring Tips Types of Fats Dining Out Label reading Snack list The diabetes portion plate Diabetes Resources A1c to eAG Conversion Chart Blood Glucose Log Diabetes Recommended Care Schedule Support Group Diabetes Success  Plan Core Class Satisfaction Survey   Follow-Up Plan: Attend core 2

## 2023-12-18 NOTE — Telephone Encounter (Signed)
 Pharmacy Patient Advocate Encounter   Received notification from CoverMyMeds that prior authorization for  Rybelsus  3MG  tablets is required/requested.   Insurance verification completed.   The patient is insured through [facility] .   Per test claim: PA required; PA submitted to above mentioned insurance via CoverMyMeds Key/confirmation #/EOC [id number] Status is pending

## 2023-12-18 NOTE — Telephone Encounter (Signed)
 Spoke with pt and advised per Dr Rodolfo Clan pt should be taking Torsemide  20mg   - 1 tablet by mouth daily along with KCL 20meq - 1 tablet by mouth daily.  Pt verbalizes understanding and thanked Charity fundraiser for the call.

## 2023-12-18 NOTE — Telephone Encounter (Signed)
 Copied from CRM [id number]. Topic: Clinical - Prescription Issue >> Dec 18, 2023  5:03 PM Jethro Morrison wrote: Reason for CRM:PT CALLED STATED PHARMACY CALLED HIM EARLIER TODAY TO ADVISE THE  Blood Glucose Monitoring Suppl (ACCU-CHEK AVIVA PLUS) w/Device KIT  THEY ARE NOT MAKING IT ANY LONGER. Reason for Disposition  [1] Caller requesting NON-URGENT health information AND [2] PCP's office is the best resource  Answer Assessment - Initial Assessment Questions 1. REASON FOR CALL or QUESTION: "What is your reason for calling today?" or "How can I best help you?" or "What question do you have that I can help answer?"     PT CALLED STATED PHARMACY CALLED HIM EARLIER TODAY TO ADVISE THE  Blood Glucose Monitoring Suppl (ACCU-CHEK AVIVA PLUS) w/Device KIT  THEY ARE NOT MAKING IT ANY LONGER.  Protocols used: Information Only Call - No Triage-A-AH

## 2023-12-19 ENCOUNTER — Other Ambulatory Visit (HOSPITAL_COMMUNITY): Payer: Self-pay

## 2023-12-19 NOTE — Telephone Encounter (Signed)
 Pharmacy Patient Advocate Encounter  Received notification from [facility] that Prior Authorization for RYBELSUS  3MG  has been APPROVED from 12/18/2023 to 12/17/2024. Unable to obtain price due to refill too soon rejection, last fill date 12/19/2023 next available fill date 01/11/2024   PA #/Case ID/Reference #: [id number]

## 2023-12-21 ENCOUNTER — Telehealth: Payer: Self-pay

## 2023-12-21 NOTE — Progress Notes (Signed)
 Complex Care Management Note  Care Guide Note 12/21/2023 Name: Richard Vazquez MRN: [id number] DOB: [birth year]  Richard Vazquez is a 68 y.o. year old male who sees Catheryn Cluck, MD for primary care. I reached out to Reid Capuchin by phone today to offer complex care management services.  Mr. Stay was given information about Complex Care Management services today including:   The Complex Care Management services include support from the care team which includes your Nurse Care Manager, Clinical Social Worker, or Pharmacist.  The Complex Care Management team is here to help remove barriers to the health concerns and goals most important to you. Complex Care Management services are voluntary, and the patient may decline or stop services at any time by request to their care team member.   Complex Care Management Consent Status: Patient agreed to services and verbal consent obtained.   Follow up plan:  Telephone appointment with complex care management team member scheduled for:  01/08/24 at 3:30 p.m.   Encounter Outcome:  Patient Scheduled  [facility]  [facility], [facility] Care Management Assistant Direct Dial: [phone]  Fax: [phone]

## 2023-12-25 ENCOUNTER — Encounter: Payer: Self-pay | Admitting: Family Medicine

## 2023-12-25 ENCOUNTER — Encounter: Payer: Self-pay | Admitting: Skilled Nursing Facility1

## 2023-12-25 ENCOUNTER — Encounter: Attending: Family Medicine | Admitting: Skilled Nursing Facility1

## 2023-12-25 DIAGNOSIS — I8393 Asymptomatic varicose veins of bilateral lower extremities: Secondary | ICD-10-CM

## 2023-12-25 DIAGNOSIS — E1165 Type 2 diabetes mellitus with hyperglycemia: Secondary | ICD-10-CM | POA: Diagnosis not present

## 2023-12-25 NOTE — Progress Notes (Signed)
 Patient was seen on 12/25/2023 for the second of a series of three diabetes self-management courses at [facility].  Pt has brought his blood sugars down by 100 for fasting and post prandial. He has made significant changes with his diet and physical activity.     The following learning objectives were met by the patient during this class:  Describe the role of different macronutrients on glucose Explain how carbohydrates affect blood glucose State what foods contain the most carbohydrates Demonstrate carbohydrate counting Demonstrate how to read Nutrition Facts food label Describe effects of various fats on heart health Describe the importance of good nutrition for health and healthy eating strategies Describe techniques for managing your shopping, cooking and meal planning List strategies to follow meal plan when dining out Describe the effects of alcohol on glucose and how to use it safely  Goals:  Follow Diabetes Meal Plan as instructed  Eat 3 meals and 2 snacks, every 3-5 hrs  Limit carbohydrate intake to 45 grams carbohydrate/meal Limit carbohydrate intake to 15 grams carbohydrate/snack Add lean protein foods to meals/snacks  Monitor glucose levels as instructed by your doctor   Follow-Up Plan: Attend Core 3 Work towards following your personal food plan.

## 2023-12-26 NOTE — Telephone Encounter (Signed)
 Messaged informed via MyChart message with Dr. Velna Ghee instructions.

## 2023-12-27 ENCOUNTER — Other Ambulatory Visit (HOSPITAL_BASED_OUTPATIENT_CLINIC_OR_DEPARTMENT_OTHER): Payer: Self-pay | Admitting: Cardiovascular Disease

## 2023-12-27 DIAGNOSIS — I484 Atypical atrial flutter: Secondary | ICD-10-CM | POA: Diagnosis not present

## 2023-12-27 DIAGNOSIS — I4510 Unspecified right bundle-branch block: Secondary | ICD-10-CM | POA: Diagnosis not present

## 2023-12-27 DIAGNOSIS — I455 Other specified heart block: Secondary | ICD-10-CM | POA: Diagnosis not present

## 2023-12-27 DIAGNOSIS — I4819 Other persistent atrial fibrillation: Secondary | ICD-10-CM | POA: Diagnosis not present

## 2023-12-27 LAB — SPECIMEN STATUS REPORT

## 2023-12-31 ENCOUNTER — Ambulatory Visit: Admitting: Family Medicine

## 2023-12-31 ENCOUNTER — Encounter: Payer: Self-pay | Admitting: Family Medicine

## 2023-12-31 ENCOUNTER — Telehealth: Payer: Self-pay | Admitting: Cardiovascular Disease

## 2023-12-31 VITALS — BP 116/73 | HR 99 | Temp 97.5°F | Resp 18 | Wt 286.8 lb

## 2023-12-31 DIAGNOSIS — K5909 Other constipation: Secondary | ICD-10-CM

## 2023-12-31 DIAGNOSIS — Z7984 Long term (current) use of oral hypoglycemic drugs: Secondary | ICD-10-CM | POA: Diagnosis not present

## 2023-12-31 DIAGNOSIS — I878 Other specified disorders of veins: Secondary | ICD-10-CM

## 2023-12-31 DIAGNOSIS — I4819 Other persistent atrial fibrillation: Secondary | ICD-10-CM

## 2023-12-31 DIAGNOSIS — D508 Other iron deficiency anemias: Secondary | ICD-10-CM

## 2023-12-31 DIAGNOSIS — B351 Tinea unguium: Secondary | ICD-10-CM | POA: Diagnosis not present

## 2023-12-31 DIAGNOSIS — E1165 Type 2 diabetes mellitus with hyperglycemia: Secondary | ICD-10-CM

## 2023-12-31 MED ORDER — GLIPIZIDE ER 2.5 MG PO TB24: 2.5000 mg | ORAL_TABLET | Freq: Every day | ORAL | 0 refills | Status: DC

## 2023-12-31 MED ORDER — RYBELSUS 3 MG PO TABS: 3.0000 mg | ORAL_TABLET | Freq: Every day | ORAL | 2 refills | Status: DC

## 2023-12-31 MED ORDER — POLYETHYLENE GLYCOL 3350 17 G PO PACK: 17.0000 g | PACK | Freq: Two times a day (BID) | ORAL | 3 refills | Status: AC

## 2023-12-31 MED ORDER — METFORMIN HCL ER 750 MG PO TB24: 1500.0000 mg | ORAL_TABLET | Freq: Every day | ORAL | 0 refills | Status: DC

## 2023-12-31 MED ORDER — CONTOUR BLOOD GLUCOSE SYSTEM W/DEVICE KIT: PACK | 1 refills | Status: DC

## 2023-12-31 NOTE — Telephone Encounter (Signed)
 Pt c/o medication issue:  1. Name of Medication:Semaglutide  (RYBELSUS ) 3 MG TABS   2. How are you currently taking this medication (dosage and times per day)? 3 mg, Daily   3. Are you having a reaction (difficulty breathing--STAT)? -  4. What is your medication issue? Pt calling as FYI to inform that his pcp started on new medication

## 2023-12-31 NOTE — Progress Notes (Signed)
 Assessment/Plan:    Assessment & Plan Type 2 diabetes mellitus with hyperglycemia Partially controlled with recent metformin  increase to 750 mg twice daily improving fasting glucose, but postprandial levels remain elevated, especially after lunch. Glipizide  2.5 mg daily may require adjustment. Rybelsus  offers cardiovascular, weight, and renal benefits, but cost is a factor. He is willing to try Rybelsus  at $80. Exercise and dietary changes contribute to weight loss and improved glucose control. - Initiate Rybelsus  3 mg daily. - Continue metformin  750 mg twice daily. - Continue glipizide  2.5 mg daily. - Monitor blood glucose levels at home. - Follow up with dietitian. - Encourage postprandial exercise. - Ensure adequate hydration.  Atrial fibrillation Recent episodes with current sinus rhythm. Ablation procedure planned pending improved diabetes control. He is preparing for the procedure. - Proceed with planned ablation procedure. - Monitor for atrial fibrillation symptoms.  Venous insufficiency (chronic) Chronic venous insufficiency with varicose veins and venous congestion. Compression stockings reduce leg swelling. Ongoing follow-up with vascular specialist. - Continue compression stockings. - Follow up with vascular specialist as needed.  Constipation Likely related to medication regimen, including metformin . Miralax and Dulcolax provide some relief. Increased fiber intake, hydration, and exercise recommended. - Continue Miralax and Dulcolax as needed. - Increase dietary fiber intake. - Encourage adequate hydration. - Encourage regular exercise.  Anemia Currently well-managed with iron supplementation every other day. Recent blood work normal. Reassessment planned post-ablation. - Continue iron supplementation every other day. - Reassess anemia status post-ablation.      Medications Discontinued During This Encounter  Medication Reason   Blood Glucose Monitoring Suppl  (ACCU-CHEK AVIVA PLUS) w/Device KIT    glipiZIDE  (GLIPIZIDE  XL) 2.5 MG 24 hr tablet Reorder   glucose blood (ACCU-CHEK AVIVA PLUS) test strip    metFORMIN  (GLUCOPHAGE -XR) 750 MG 24 hr tablet Reorder   polyethylene glycol (MIRALAX / GLYCOLAX) 17 g packet Reorder    Return in about 2 months (around 03/01/2024).    Subjective:   Encounter date: 12/31/2023  Richard Vazquez is a 68 y.o. male who has Atrial flutter (HCC); Hyperlipemia; Hematuria; Carotid artery disease (HCC); Persistent atrial fibrillation (HCC); CAD (coronary artery disease); Shift work sleep disorder; OSA (obstructive sleep apnea); Class 2 severe obesity due to excess calories with serious comorbidity and body mass index (BMI) of 38.0 to 38.9 in adult [facility]); Renal stones; Prediabetes; Chronic constipation; Rash; Dyspnea on exertion; Bilateral edema of lower extremity; Vitamin D  deficiency; Psoriasis; Venous stasis of both lower extremities; Varicose veins of left lower extremity with pain; Absolute anemia; Benign prostatic hyperplasia with urinary frequency; Primary hypertension; and Type 2 diabetes mellitus with hyperglycemia, without long-term current use of insulin  (HCC) on their problem list..   He  has a past medical history of Acute pain of right knee (05/16/2022), Atrial fibrillation (HCC), Carotid artery disease (HCC), Colon polyps, COPD (chronic obstructive pulmonary disease) (HCC), Former smoker (12/21/2015), Hyperlipemia, Hypertension (Not sure), Kidney stones, Left carotid bruit (08/05/2021), Obesity, Paroxysmal atrial flutter (HCC) (05/29/2021), Sleep apnea (February 2023), and Sleep apnea treated with nocturnal bilevel positive airway pressure (BPAP).Chakita Mcgraw Aas   He presents with chief complaint of Diabetes (2 week follow up. Pt is not fasting; in home glucose machine and test trips are needed. //HM due- eye exam and immunizations (records were request) foot exam, and screenings ) .   Discussed the use of AI scribe software for  clinical note transcription with the patient, who gave verbal consent to proceed.  History of Present Illness Richard Vazquez is a 68 year old  male with diabetes who presents for a follow-up visit.  He has been experiencing difficulties obtaining test strips due to insurance coverage, which has hindered consistent monitoring of his blood glucose levels. He contacted his insurance provider, [employer], and has been provided with alternative options for test strips that are covered. He is currently on metformin , which was increased and split into two doses to reduce gastrointestinal upset, taking 750 mg twice a day. He also takes glipizide . There was a discussion about the cost of Rybelsus , initially cost prohibitive at $400, but later approved by insurance at $80. His blood sugar readings have shown improvement in fasting levels, with some readings in the 120s, but postprandial levels remain elevated, particularly after lunch.  He has a history of atrial fibrillation with recent episodes but believes he is currently in sinus rhythm. He is preparing for an ablation procedure and has received letters regarding the preparation for this procedure.  Since starting metformin , he has experienced changes in bowel habits, initially having diarrhea and now experiencing constipation. He manages this with Miralax and Dulcolax, which provide some relief.  He has been working with a Data processing manager and has lost about four pounds. He acknowledges the need for more physical activity but has not been able to exercise as much as needed. He wears compression stockings for venous congestion and has noticed some improvement in leg swelling.  He has a history of anemia and has been on iron supplements every other day. He had blood work done recently, which was non-fasting, and is awaiting further instructions regarding his iron levels.       Past Surgical History:  Procedure Laterality Date   A-FLUTTER ABLATION N/A 01/16/2022    Procedure: A-FLUTTER ABLATION;  Surgeon: Verona Goodwill, MD;  Location: [facility] INVASIVE CV LAB;  Service: Cardiovascular;  Laterality: N/A;   APPENDECTOMY  1971   CARDIOVERSION N/A 05/31/2021   Procedure: CARDIOVERSION;  Surgeon: Hazle Lites, MD;  Location: [facility] ENDOSCOPY;  Service: Cardiovascular;  Laterality: N/A;   CARDIOVERSION N/A 10/05/2021   Procedure: CARDIOVERSION;  Surgeon: Hazle Lites, MD;  Location: [facility] ENDOSCOPY;  Service: Cardiovascular;  Laterality: N/A;   CARDIOVERSION N/A 11/01/2023   Procedure: CARDIOVERSION;  Surgeon: Jann Melody, MD;  Location: [facility] INVASIVE CV LAB;  Service: Cardiovascular;  Laterality: N/A;   COLONOSCOPY     LEFT HEART CATH AND CORONARY ANGIOGRAPHY N/A 12/07/2021   Procedure: LEFT HEART CATH AND CORONARY ANGIOGRAPHY;  Surgeon: Kyra Phy, MD;  Location: [facility] INVASIVE CV LAB;  Service: Cardiovascular;  Laterality: N/A;   LITHOTRIPSY     multiple   TEE WITHOUT CARDIOVERSION N/A 05/31/2021   Procedure: TRANSESOPHAGEAL ECHOCARDIOGRAM (TEE);  Surgeon: Hazle Lites, MD;  Location: [facility] ENDOSCOPY;  Service: Cardiovascular;  Laterality: N/A;    Outpatient Medications Prior to Visit  Medication Sig Dispense Refill   bisacodyl  (DULCOLAX) 5 MG EC tablet Take 5 mg by mouth daily as needed for moderate constipation or severe constipation.     cholecalciferol (VITAMIN D3) 25 MCG (1000 UNIT) tablet Take 1,000 Units by mouth daily.     diltiazem  (CARDIZEM  CD) 240 MG 24 hr capsule TAKE 1 CAPSULE BY MOUTH DAILY 90 capsule 3   ELIQUIS  5 MG TABS tablet TAKE 1 TABLET BY MOUTH TWICE A DAY 180 tablet 3   indapamide  (LOZOL ) 2.5 MG tablet Take 2.5 mg by mouth daily.     Iron, Ferrous Sulfate , 325 (65 Fe) MG TABS Take 65 mg by mouth every other day.  Lancet Device MISC 1 each by Does not apply route in the morning, at noon, and at bedtime. May substitute to any manufacturer covered by patient's insurance. 1 each 0   Multiple Vitamin (MULTIVITAMIN)  tablet Take 1 tablet by mouth daily.     nitroGLYCERIN  (NITROSTAT ) 0.4 MG SL tablet Place 1 tablet (0.4 mg total) under the tongue every 5 (five) minutes as needed for chest pain (Max 3 doses). 25 tablet 2   Omega-3 1000 MG CAPS Take 1,000 mg by mouth daily.     potassium chloride  SA (KLOR-CON  M) 20 MEQ tablet Take 1 tablet (20 mEq total) by mouth daily. 90 tablet 1   rosuvastatin  (CRESTOR ) 20 MG tablet TAKE 1 TABLET BY MOUTH DAILY 90 tablet 3   SHINGRIX injection      tamsulosin (FLOMAX) 0.4 MG CAPS capsule Take 0.4 mg by mouth daily.     torsemide  (DEMADEX ) 20 MG tablet Take 1 tablet (20 mg total) by mouth daily.     triamcinolone  cream (KENALOG ) 0.1 % Apply 1 Application topically 2 (two) times daily. (Patient taking differently: Apply 1 Application topically 2 (two) times daily as needed (Rash).) 30 g 0   glipiZIDE  (GLIPIZIDE  XL) 2.5 MG 24 hr tablet Take 1 tablet (2.5 mg total) by mouth daily with breakfast. 30 tablet 0   metFORMIN  (GLUCOPHAGE -XR) 750 MG 24 hr tablet Take 2 tablets (1,500 mg total) by mouth daily with breakfast. 180 tablet 0   polyethylene glycol (MIRALAX / GLYCOLAX) 17 g packet Take 17 g by mouth daily.     Lancets Misc. (ACCU-CHEK FASTCLIX LANCET) KIT 1 each by Does not apply route daily before breakfast. 90 kit 3   Blood Glucose Monitoring Suppl (ACCU-CHEK AVIVA PLUS) w/Device KIT 1 each by Does not apply route as needed. 1 kit 0   glucose blood (ACCU-CHEK AVIVA PLUS) test strip Use as instructed 100 each 12   No facility-administered medications prior to visit.    Family History  Problem Relation Age of Onset   Diabetes Mother    Cancer Mother    Heart disease Mother    Hyperlipidemia Mother    Hypertension Mother    Miscarriages / [place] Mother    Stroke Mother    Varicose Veins Mother    Heart disease Father        smoker   Aortic stenosis Father    Hyperlipidemia Father    Hypertension Father    Ovarian cancer Sister    Colon cancer Paternal Uncle     Aneurysm Paternal Uncle        Brain   Cancer Paternal Uncle        all in abdomin, ? colon cancer   Asthma Sister    Cancer Paternal Uncle    Early death Paternal Aunt    Early death Paternal Uncle    Stomach cancer Neg Hx    Rectal cancer Neg Hx     Social History   Socioeconomic History   Marital status: Married    Spouse name: Not on file   Number of children: 2   Years of education: Not on file   Highest education level: 12th grade  Occupational History   Occupation: retired  Tobacco Use   Smoking status: Former    Current packs/day: 0.00    Average packs/day: 1.5 packs/day for 40.0 years (60.0 ttl pk-yrs)    Types: Cigarettes    Start date: 12/09/1979    Quit date: 12/09/2019    Years  since quitting: 4.0    Passive exposure: Past   Smokeless tobacco: Never  Vaping Use   Vaping status: Never Used  Substance and Sexual Activity   Alcohol use: Not Currently   Drug use: Never   Sexual activity: Not Currently  Other Topics Concern   Not on file  Social History Narrative   Not on file   Social Drivers of Health   Financial Resource Strain: Low Risk  (12/11/2023)   Overall Financial Resource Strain (CARDIA)    Difficulty of Paying Living Expenses: Not hard at all  Food Insecurity: No Food Insecurity (12/11/2023)   Hunger Vital Sign    Worried About Running Out of Food in the Last Year: Never true    Ran Out of Food in the Last Year: Never true  Transportation Needs: No Transportation Needs (12/11/2023)   PRAPARE - Administrator, Civil Service (Medical): No    Lack of Transportation (Non-Medical): No  Physical Activity: Sufficiently Active (12/11/2023)   Exercise Vital Sign    Days of Exercise per Week: 6 days    Minutes of Exercise per Session: 30 min  Stress: No Stress Concern Present (12/11/2023)   [employer] of Occupational Health - Occupational Stress Questionnaire    Feeling of Stress : Not at all  Social Connections: Unknown  (12/11/2023)   Social Connection and Isolation Panel [NHANES]    Frequency of Communication with Friends and Family: Patient declined    Frequency of Social Gatherings with Friends and Family: Patient declined    Attends Religious Services: Never    Database administrator or Organizations: No    Attends Banker Meetings: Patient declined    Marital Status: Married  Catering manager Violence: Not At Risk (04/16/2023)   Humiliation, Afraid, Rape, and Kick questionnaire    Fear of Current or Ex-Partner: No    Emotionally Abused: No    Physically Abused: No    Sexually Abused: No                                                                                                  Objective:  Physical Exam: BP 116/73 (BP Location: Left Arm, Patient Position: Sitting, Cuff Size: Large)   Pulse 99   Temp (!) 97.5 F (36.4 C) (Temporal)   Resp 18   Wt 286 lb 12.8 oz (130.1 kg)   SpO2 95%   BMI 35.85 kg/m   Wt Readings from Last 3 Encounters:  12/31/23 286 lb 12.8 oz (130.1 kg)  12/18/23 290 lb 3.2 oz (131.6 kg)  12/17/23 288 lb (130.6 kg)    Physical Exam GENERAL: Alert, cooperative, well developed, no acute distress. HEENT: Normocephalic, normal oropharynx, moist mucous membranes. CHEST: Clear to auscultation bilaterally, no wheezes, rhonchi, or crackles. CARDIOVASCULAR: Normal heart rate and rhythm, S1 and S2 normal without murmurs. ABDOMEN: Soft, non-tender, non-distended, without organomegaly, normal bowel sounds. EXTREMITIES: No cyanosis or edema. Nail changes with thickening, darkening and varicose veins present. Sensation intact. No ulcers breaks. Arterial circulation normal. NEUROLOGICAL: Cranial nerves grossly intact, moves all extremities without gross  motor or sensory deficit.  Diabetic foot exam was performed with the following findings:   Normal sensation of 10g monofilament Intact posterior tibialis and dorsalis pedis pulses        ECHOCARDIOGRAM  COMPLETE Result Date: 11/22/2023    ECHOCARDIOGRAM REPORT   Patient Name:   [facility]    Date of Exam: 11/22/2023 Medical Rec #:  [id number]     Height:       75.0 in Accession #:    [id number]    Weight:       297.0 lb Date of Birth:  1956-02-14     BSA:          2.596 m Patient Age:    67 years      BP:           126/72 mmHg Patient Gender: M             HR:           86 bpm. Exam Location:  [address] Procedure: 2D Echo and Intracardiac Opacification Agent (Both Spectral and Color            Flow Doppler were utilized during procedure). Indications:    I50.9* Heart failure (unspecified)  History:        Patient has prior history of Echocardiogram examinations, most                 recent 12/06/2021. COPD, Arrythmias:Atrial Fibrillation and                 Atrial Flutter; Risk Factors:Former Smoker, Sleep Apnea and                 Dyslipidemia. Carotid artery disease. A-flutter ablation.  Sonographer:    Mylinda Asa RCS Referring Phys: [phone] Verona Goodwill IMPRESSIONS  1. Left ventricular ejection fraction, by estimation, is 60 to 65%. The left ventricle has normal function. The left ventricle has no regional wall motion abnormalities. There is mild concentric left ventricular hypertrophy. Left ventricular diastolic parameters are consistent with Grade I diastolic dysfunction (impaired relaxation).  2. Right ventricular systolic function is normal. The right ventricular size is normal. Tricuspid regurgitation signal is inadequate for assessing PA pressure.  3. The mitral valve is normal in structure. No evidence of mitral valve regurgitation. No evidence of mitral stenosis.  4. The aortic valve is tricuspid. There is mild calcification of the aortic valve. Aortic valve regurgitation is not visualized. No aortic stenosis is present.  5. Aortic dilatation noted. There is mild dilatation of the aortic root, measuring 39 mm.  6. The IVC is not well-visualized. FINDINGS  Left Ventricle: Left ventricular ejection  fraction, by estimation, is 60 to 65%. The left ventricle has normal function. The left ventricle has no regional wall motion abnormalities. The left ventricular internal cavity size was normal in size. There is  mild concentric left ventricular hypertrophy. Left ventricular diastolic parameters are consistent with Grade I diastolic dysfunction (impaired relaxation). Right Ventricle: The right ventricular size is normal. No increase in right ventricular wall thickness. Right ventricular systolic function is normal. Tricuspid regurgitation signal is inadequate for assessing PA pressure. Left Atrium: Left atrial size was normal in size. Right Atrium: Right atrial size was normal in size. Pericardium: There is no evidence of pericardial effusion. Mitral Valve: The mitral valve is normal in structure. No evidence of mitral valve regurgitation. No evidence of mitral valve stenosis. Tricuspid Valve: The tricuspid valve is normal in structure. Tricuspid valve  regurgitation is not demonstrated. Aortic Valve: The aortic valve is tricuspid. There is mild calcification of the aortic valve. Aortic valve regurgitation is not visualized. No aortic stenosis is present. Pulmonic Valve: The pulmonic valve was normal in structure. Pulmonic valve regurgitation is not visualized. Aorta: Aortic dilatation noted. There is mild dilatation of the aortic root, measuring 39 mm. Venous: The IVC is not well-visualized. IAS/Shunts: No atrial level shunt detected by color flow Doppler.  LEFT VENTRICLE PLAX 2D LVIDd:         3.90 cm   Diastology LVIDs:         2.70 cm   LV e' medial:    5.37 cm/s LV PW:         1.00 cm   LV E/e' medial:  9.4 LV IVS:        1.00 cm   LV e' lateral:   7.83 cm/s LVOT diam:     2.20 cm   LV E/e' lateral: 6.5 LV SV:         83 LV SV Index:   32 LVOT Area:     3.80 cm  RIGHT VENTRICLE RV S prime:     13.43 cm/s TAPSE (M-mode): 1.9 cm LEFT ATRIUM             Index LA diam:        5.00 cm 1.93 cm/m LA Vol (A2C):   27.0  ml 10.40 ml/m LA Vol (A4C):   43.1 ml 16.60 ml/m LA Biplane Vol: 34.8 ml 13.41 ml/m  AORTIC VALVE LVOT Vmax:   132.67 cm/s LVOT Vmean:  83.333 cm/s LVOT VTI:    0.219 m  AORTA Ao Root diam: 3.90 cm Ao Asc diam:  3.60 cm MITRAL VALVE MV Area (PHT): 3.10 cm    SHUNTS MV Decel Time: 245 msec    Systemic VTI:  0.22 m MV E velocity: 50.63 cm/s  Systemic Diam: 2.20 cm MV A velocity: 73.90 cm/s MV E/A ratio:  0.69 Dalton McleanMD Electronically signed by Archer Bear Signature Date/Time: 11/22/2023/1:41:15 PM    Final    EP STUDY Result Date: 11/01/2023 See surgical note for result.   Recent Results (from the past 2160 hours)  CBC     Status: None   Collection Time: 10/30/23  1:31 PM  Result Value Ref Range   WBC 8.7 3.4 - 10.8 x10E3/uL   RBC 4.73 4.14 - 5.80 x10E6/uL   Hemoglobin 14.3 13.0 - 17.7 g/dL   Hematocrit 16.1 09.6 - 51.0 %   MCV 92 79 - 97 fL   MCH 30.2 26.6 - 33.0 pg   MCHC 32.9 31.5 - 35.7 g/dL   RDW 04.5 40.9 - 81.1 %   Platelets 233 150 - 450 x10E3/uL  Basic metabolic panel     Status: Abnormal   Collection Time: 10/30/23  1:31 PM  Result Value Ref Range   Glucose 221 (H) 70 - 99 mg/dL   BUN 21 8 - 27 mg/dL   Creatinine, Ser 9.14 0.76 - 1.27 mg/dL   eGFR 70 >78 GN/FAO/1.30   BUN/Creatinine Ratio 18 10 - 24   Sodium 142 134 - 144 mmol/L   Potassium 3.7 3.5 - 5.2 mmol/L   Chloride 101 96 - 106 mmol/L   CO2 24 20 - 29 mmol/L   Calcium  9.2 8.6 - 10.2 mg/dL  Basic metabolic panel     Status: Abnormal   Collection Time: 11/13/23 11:03 AM  Result Value Ref Range   Glucose  227 (H) 70 - 99 mg/dL   BUN 25 8 - 27 mg/dL   Creatinine, Ser 3.81 (H) 0.76 - 1.27 mg/dL   eGFR 60 >01 BP/ZWC/5.85   BUN/Creatinine Ratio 19 10 - 24   Sodium 140 134 - 144 mmol/L   Potassium 3.2 (L) 3.5 - 5.2 mmol/L   Chloride 93 (L) 96 - 106 mmol/L   CO2 25 20 - 29 mmol/L   Calcium  9.5 8.6 - 10.2 mg/dL  ECHOCARDIOGRAM COMPLETE     Status: None   Collection Time: 11/22/23  1:38 PM  Result Value  Ref Range   S' Lateral 2.70 cm   Area-P 1/2 3.10 cm2   Est EF 60 - 65%   Basic metabolic panel     Status: Abnormal   Collection Time: 11/23/23 10:41 AM  Result Value Ref Range   Glucose 283 (H) 70 - 99 mg/dL   BUN 24 8 - 27 mg/dL   Creatinine, Ser 2.77 (H) 0.76 - 1.27 mg/dL   eGFR 53 (L) >82 UM/PNT/6.14   BUN/Creatinine Ratio 17 10 - 24   Sodium 140 134 - 144 mmol/L   Potassium 3.5 3.5 - 5.2 mmol/L   Chloride 94 (L) 96 - 106 mmol/L   CO2 23 20 - 29 mmol/L   Calcium  9.9 8.6 - 10.2 mg/dL  CBC     Status: None   Collection Time: 11/23/23 10:46 AM  Result Value Ref Range   WBC 7.5 3.4 - 10.8 x10E3/uL   RBC 4.99 4.14 - 5.80 x10E6/uL   Hemoglobin 15.6 13.0 - 17.7 g/dL   Hematocrit 43.1 54.0 - 51.0 %   MCV 92 79 - 97 fL   MCH 31.3 26.6 - 33.0 pg   MCHC 34.1 31.5 - 35.7 g/dL   RDW 08.6 76.1 - 95.0 %   Platelets 229 150 - 450 x10E3/uL  POC CBG, ED     Status: Abnormal   Collection Time: 12/16/23 11:25 AM  Result Value Ref Range   Glucose-Capillary 495 (H) 70 - 99 mg/dL    Comment: Glucose reference range applies only to samples taken after fasting for at least 8 hours.   Comment 1 Notify RN   CBC with Differential     Status: None   Collection Time: 12/16/23 11:32 AM  Result Value Ref Range   WBC 7.9 4.0 - 10.5 K/uL   RBC 4.96 4.22 - 5.81 MIL/uL   Hemoglobin 15.5 13.0 - 17.0 g/dL   HCT 93.2 67.1 - 24.5 %   MCV 90.1 80.0 - 100.0 fL   MCH 31.3 26.0 - 34.0 pg   MCHC 34.7 30.0 - 36.0 g/dL   RDW 80.9 98.3 - 38.2 %   Platelets 232 150 - 400 K/uL   nRBC 0.0 0.0 - 0.2 %   Neutrophils Relative % 71 %   Neutro Abs 5.6 1.7 - 7.7 K/uL   Lymphocytes Relative 14 %   Lymphs Abs 1.1 0.7 - 4.0 K/uL   Monocytes Relative 9 %   Monocytes Absolute 0.7 0.1 - 1.0 K/uL   Eosinophils Relative 4 %   Eosinophils Absolute 0.4 0.0 - 0.5 K/uL   Basophils Relative 1 %   Basophils Absolute 0.1 0.0 - 0.1 K/uL   Immature Granulocytes 1 %   Abs Immature Granulocytes 0.04 0.00 - 0.07 K/uL    Comment:  Performed at [facility], [address] [place], [place] [address]  Comprehensive metabolic panel     Status: Abnormal  Collection Time: 12/16/23 11:32 AM  Result Value Ref Range   Sodium 134 (L) 135 - 145 mmol/L   Potassium 3.9 3.5 - 5.1 mmol/L   Chloride 90 (L) 98 - 111 mmol/L   CO2 30 22 - 32 mmol/L   Glucose, Bld 576 (HH) 70 - 99 mg/dL    Comment: CRITICAL RESULT CALLED TO, READ BACK BY AND VERIFIED WITH SOPHIE GOUCH, RN4/20/25 1219 MGRAMINSKI Glucose reference range applies only to samples taken after fasting for at least 8 hours.    BUN 27 (H) 8 - 23 mg/dL   Creatinine, Ser 5.28 (H) 0.61 - 1.24 mg/dL   Calcium  9.4 8.9 - 10.3 mg/dL   Total Protein 7.6 6.5 - 8.1 g/dL   Albumin 3.6 3.5 - 5.0 g/dL   AST 45 (H) 15 - 41 U/L   ALT 39 0 - 44 U/L   Alkaline Phosphatase 90 38 - 126 U/L   Total Bilirubin 1.0 0.0 - 1.2 mg/dL   GFR, Estimated 57 (L) >60 mL/min    Comment: (NOTE) Calculated using the CKD-EPI Creatinine Equation (2021)    Anion gap 14 5 - 15    Comment: Performed at [facility], [address] [place], [place] [address]  Urinalysis, Routine w reflex microscopic -Urine, Clean Catch     Status: Abnormal   Collection Time: 12/16/23 11:34 AM  Result Value Ref Range   Color, Urine YELLOW YELLOW   APPearance CLEAR CLEAR   Specific Gravity, Urine 1.010 1.005 - 1.030   pH 5.5 5.0 - 8.0   Glucose, UA >=500 (A) NEGATIVE mg/dL   Hgb urine dipstick TRACE (A) NEGATIVE   Bilirubin Urine NEGATIVE NEGATIVE   Ketones, ur NEGATIVE NEGATIVE mg/dL   Protein, ur NEGATIVE NEGATIVE mg/dL   Nitrite NEGATIVE NEGATIVE   Leukocytes,Ua NEGATIVE NEGATIVE    Comment: Performed at [facility], [address] [place], [place] [address]  Urinalysis, Microscopic (reflex)     Status: Abnormal   Collection Time: 12/16/23 11:34 AM  Result Value Ref Range   RBC / HPF 0-5 0 - 5 RBC/hpf   WBC, UA NONE SEEN 0 - 5 WBC/hpf   Bacteria, UA RARE (A) NONE SEEN    Squamous Epithelial / HPF NONE SEEN 0 - 5 /HPF    Comment: Performed at [facility], [address] [place], [place] [address]  CBG monitoring, ED     Status: Abnormal   Collection Time: 12/16/23  1:05 PM  Result Value Ref Range   Glucose-Capillary 575 (HH) 70 - 99 mg/dL    Comment: Glucose reference range applies only to samples taken after fasting for at least 8 hours.   Comment 1 Notify RN   CBG monitoring, ED     Status: Abnormal   Collection Time: 12/16/23  1:48 PM  Result Value Ref Range   Glucose-Capillary 574 (HH) 70 - 99 mg/dL    Comment: Glucose reference range applies only to samples taken after fasting for at least 8 hours.   Comment 1 Notify RN   CBG monitoring, ED     Status: Abnormal   Collection Time: 12/16/23  2:35 PM  Result Value Ref Range   Glucose-Capillary 428 (H) 70 - 99 mg/dL    Comment: Glucose reference range applies only to samples taken after fasting for at least 8 hours.   Comment 1 Notify RN   POCT glucose (manual entry)     Status: Abnormal  Collection Time: 12/17/23  3:50 PM  Result Value Ref Range   POC Glucose 399 (A) 70 - 99 mg/dl  POCT glycosylated hemoglobin (Hb A1C)     Status: Abnormal   Collection Time: 12/17/23  4:09 PM  Result Value Ref Range   Hemoglobin A1C 12.0 (A) 4.0 - 5.6 %   HbA1c POC (<> result, manual entry)     HbA1c, POC (prediabetic range)     HbA1c, POC (controlled diabetic range)    CBC with Differential/Platelet     Status: Abnormal   Collection Time: 12/27/23 11:33 AM  Result Value Ref Range   WBC 9.2 3.4 - 10.8 x10E3/uL   RBC 4.71 4.14 - 5.80 x10E6/uL   Hemoglobin 14.9 13.0 - 17.7 g/dL   Hematocrit 16.1 09.6 - 51.0 %   MCV 92 79 - 97 fL   MCH 31.6 26.6 - 33.0 pg   MCHC 34.5 31.5 - 35.7 g/dL   RDW 04.5 40.9 - 81.1 %   Platelets 257 150 - 450 x10E3/uL   Neutrophils 65 Not Estab. %   Lymphs 17 Not Estab. %   Monocytes 11 Not Estab. %   Eos 6 Not Estab. %   Basos 1 Not Estab. %   Neutrophils Absolute  6.0 1.4 - 7.0 x10E3/uL   Lymphocytes Absolute 1.5 0.7 - 3.1 x10E3/uL   Monocytes Absolute 1.0 (H) 0.1 - 0.9 x10E3/uL   EOS (ABSOLUTE) 0.6 (H) 0.0 - 0.4 x10E3/uL   Basophils Absolute 0.1 0.0 - 0.2 x10E3/uL   Immature Granulocytes 0 Not Estab. %   Immature Grans (Abs) 0.0 0.0 - 0.1 x10E3/uL  Basic metabolic panel with GFR     Status: Abnormal   Collection Time: 12/27/23 11:33 AM  Result Value Ref Range   Glucose 264 (H) 70 - 99 mg/dL   BUN 30 (H) 8 - 27 mg/dL   Creatinine, Ser 9.14 (H) 0.76 - 1.27 mg/dL   eGFR 48 (L) >78 GN/FAO/1.30   BUN/Creatinine Ratio 19 10 - 24   Sodium 135 134 - 144 mmol/L   Potassium 3.0 (L) 3.5 - 5.2 mmol/L   Chloride 86 (L) 96 - 106 mmol/L   CO2 28 20 - 29 mmol/L   Calcium  9.9 8.6 - 10.2 mg/dL  Specimen status report     Status: None (Preliminary result)   Collection Time: 12/27/23 11:33 AM  Result Value Ref Range   specimen status report Comment     Comment: Ambiguous Test Order Ambiguous Test Order         Carnell Christian, MD, MS

## 2023-12-31 NOTE — Patient Instructions (Signed)
  VISIT SUMMARY: Today, we discussed your diabetes management, atrial fibrillation, venous insufficiency, constipation, and anemia. We reviewed your current medications, recent changes, and upcoming procedures. We also talked about your recent weight loss and the importance of exercise and hydration.  YOUR PLAN: -TYPE 2 DIABETES MELLITUS WITH HYPERGLYCEMIA: Type 2 diabetes is a condition where your body does not use insulin  properly, leading to high blood sugar levels. We will start you on Rybelsus  3 mg daily, continue your metformin  750 mg twice daily, and glipizide  2.5 mg daily. Please monitor your blood glucose levels at home, follow up with your dietitian, and try to exercise after meals. Ensure you stay hydrated.  -ATRIAL FIBRILLATION: Atrial fibrillation is an irregular and often rapid heart rate. You are currently in sinus rhythm and preparing for an ablation procedure. Please proceed with the planned ablation and monitor for any symptoms of atrial fibrillation.  -VENOUS INSUFFICIENCY (CHRONIC): Chronic venous insufficiency is a condition where your leg veins have trouble sending blood back to your heart. Continue wearing your compression stockings and follow up with your vascular specialist as needed.  -CONSTIPATION: Constipation is difficulty in passing stools. It may be related to your medications, including metformin . Continue using Miralax and Dulcolax as needed, increase your dietary fiber intake, stay hydrated, and exercise regularly.  -ANEMIA: Anemia is a condition where you do not have enough healthy red blood cells. Your anemia is currently well-managed with iron supplements every other day. We will reassess your anemia status after your ablation procedure.  INSTRUCTIONS: Please follow up with your dietitian and vascular specialist as needed. Continue monitoring your blood glucose levels at home and proceed with your planned ablation procedure. We will reassess your anemia status  after the ablation.

## 2024-01-01 ENCOUNTER — Encounter: Payer: Self-pay | Admitting: Family Medicine

## 2024-01-01 ENCOUNTER — Encounter: Attending: Family Medicine | Admitting: Skilled Nursing Facility1

## 2024-01-01 ENCOUNTER — Encounter: Payer: Self-pay | Admitting: Skilled Nursing Facility1

## 2024-01-01 ENCOUNTER — Telehealth (HOSPITAL_COMMUNITY): Payer: Self-pay

## 2024-01-01 VITALS — Wt 286.0 lb

## 2024-01-01 DIAGNOSIS — E1165 Type 2 diabetes mellitus with hyperglycemia: Secondary | ICD-10-CM | POA: Diagnosis not present

## 2024-01-01 NOTE — Telephone Encounter (Signed)
 Spoke with patient to complete pre-procedure call.     New medical conditions? Type II Diabetes Recent hospitalizations or surgeries? No Started any new medications? Glipizide  and Metformin  increased to 1500 mg daily. Also started on Rybelsus  daily; Advised patient he will need to hold Rybelsus  for one day prior to procedure, taking the last dose on 01/23/24.   Patient made aware to contact office to inform of any new medications started. Any changes in activities of daily living? No  Pre-procedure testing scheduled: CT on 01/04/24 and lab work completed. Patient confirms taking potassium supplement daily. Advised to stay hydrated. Will have Dr. Arlester Ladd review results.   Confirmed patient is taking Eliquis  twice daily and will continue taking medication before procedure or it may need to be rescheduled.  Confirmed patient is scheduled for Atrial Fibrillation Ablation, Atrial Flutter Ablation on Friday, May 30 with Dr. Marlane Silver. Instructed patient to arrive at the Main Entrance A at [facility]: [address] [place], [place] [address] and check in at Admitting at 10:30 AM.  Advised of plan to go home the same day and will only stay overnight if medically necessary. You MUST have a responsible adult to drive you home and MUST be with you the first 24 hours after you arrive home or your procedure could be cancelled.  Patient verbalized understanding to information provided and is agreeable to proceed with procedure.

## 2024-01-01 NOTE — Progress Notes (Signed)
 Patient was seen on 01/01/2024 for the third of a series of three diabetes self-management courses at the [facility]. The following learning objectives were met by the patient during this class:  State the amount of activity recommended for healthy living Describe activities suitable for individual needs Identify ways to regularly incorporate activity into daily life Identify barriers to activity and ways to over come these barriers Identify diabetes medications being personally used and their primary action for lowering glucose and possible side effects Describe role of stress on blood glucose and develop strategies to address psychosocial issues Identify diabetes complications and ways to prevent them Explain how to manage diabetes during illness Evaluate success in meeting personal goal Establish 2-3 goals that they will plan to diligently work on until they return for the  51-month follow-up visit  Goals:  I will continue my activity level and eat well  Your patient has identified these potential barriers to change:  Motivation Finances Stress Lack of Family Support   Your patient has identified their diabetes self-care support plan as  [facility] Support Group Family Education officer, environmental Resources  Plan:  Attend Monthly Diabetes Support Group as needed or make a future follow up appointment

## 2024-01-01 NOTE — Telephone Encounter (Signed)
 Patient was advised during pre-procedure call on today, he will need to hold Rybelsus  for one day prior to procedure, taking the last dose on 01/23/24. He verbalized understanding.

## 2024-01-02 ENCOUNTER — Encounter: Payer: Self-pay | Admitting: Family Medicine

## 2024-01-02 LAB — CBC WITH DIFFERENTIAL/PLATELET
Basophils Absolute: 0.1 10*3/uL (ref 0.0–0.2)
Basos: 1 %
EOS (ABSOLUTE): 0.6 10*3/uL — ABNORMAL HIGH (ref 0.0–0.4)
Eos: 6 %
Hematocrit: 43.2 % (ref 37.5–51.0)
Hemoglobin: 14.9 g/dL (ref 13.0–17.7)
Immature Grans (Abs): 0 10*3/uL (ref 0.0–0.1)
Immature Granulocytes: 0 %
Lymphocytes Absolute: 1.5 10*3/uL (ref 0.7–3.1)
Lymphs: 17 %
MCH: 31.6 pg (ref 26.6–33.0)
MCHC: 34.5 g/dL (ref 31.5–35.7)
MCV: 92 fL (ref 79–97)
Monocytes Absolute: 1 10*3/uL — ABNORMAL HIGH (ref 0.1–0.9)
Monocytes: 11 %
Neutrophils Absolute: 6 10*3/uL (ref 1.4–7.0)
Neutrophils: 65 %
Platelets: 257 10*3/uL (ref 150–450)
RBC: 4.71 x10E6/uL (ref 4.14–5.80)
RDW: 13.2 % (ref 11.6–15.4)
WBC: 9.2 10*3/uL (ref 3.4–10.8)

## 2024-01-02 LAB — BASIC METABOLIC PANEL WITH GFR
BUN/Creatinine Ratio: 19 (ref 10–24)
BUN: 30 mg/dL — ABNORMAL HIGH (ref 8–27)
CO2: 28 mmol/L (ref 20–29)
Calcium: 9.9 mg/dL (ref 8.6–10.2)
Chloride: 86 mmol/L — ABNORMAL LOW (ref 96–106)
Creatinine, Ser: 1.57 mg/dL — ABNORMAL HIGH (ref 0.76–1.27)
Glucose: 264 mg/dL — ABNORMAL HIGH (ref 70–99)
Potassium: 3 mmol/L — ABNORMAL LOW (ref 3.5–5.2)
Sodium: 135 mmol/L (ref 134–144)
eGFR: 48 mL/min/{1.73_m2} — ABNORMAL LOW (ref >59)

## 2024-01-03 ENCOUNTER — Encounter: Payer: Self-pay | Admitting: Family Medicine

## 2024-01-03 NOTE — Telephone Encounter (Signed)
 Pt with hypokalemia - per Dr Maximo Spar increase KCL to 40meq and repeat K+ in one week.  Spoke with pt and advised of recommendation per Dr Maximo Spar.  Pt verbalizes understanding and agrees with current plan.

## 2024-01-04 ENCOUNTER — Ambulatory Visit (HOSPITAL_COMMUNITY)
Admission: RE | Admit: 2024-01-04 | Discharge: 2024-01-04 | Disposition: A | Discharge status: Home / Self Care | Source: Ambulatory Visit | Attending: Cardiovascular Disease

## 2024-01-04 DIAGNOSIS — I4819 Other persistent atrial fibrillation: Secondary | ICD-10-CM | POA: Diagnosis not present

## 2024-01-04 DIAGNOSIS — I495 Sick sinus syndrome: Secondary | ICD-10-CM

## 2024-01-04 DIAGNOSIS — I484 Atypical atrial flutter: Secondary | ICD-10-CM | POA: Diagnosis not present

## 2024-01-04 DIAGNOSIS — I4510 Unspecified right bundle-branch block: Secondary | ICD-10-CM | POA: Diagnosis not present

## 2024-01-04 DIAGNOSIS — I2510 Atherosclerotic heart disease of native coronary artery without angina pectoris: Secondary | ICD-10-CM | POA: Diagnosis not present

## 2024-01-04 MED ORDER — IOHEXOL 350 MG/ML SOLN
100.0000 mL | Freq: Once | INTRAVENOUS | Status: AC | PRN
  Administered 2024-01-04: 100 mL via INTRAVENOUS

## 2024-01-04 NOTE — Progress Notes (Addendum)
 Patient presents for pulmonary vein CT.   IV infiltrated during contrast administration (Left AC).  Skin slightly pink, normal temperature, slight edema at site. Full mobility and patient denies pain, neurovascular intact (+2 radial pulse). Provided ice and heat to alternate, elevated arm. Educated patient on care for site and precautions.   Education sheet provided.  Patient verbalized understanding. Dr. Alda Amas aware.   IV restarted and scan completed.

## 2024-01-07 ENCOUNTER — Encounter: Payer: Self-pay | Admitting: Family Medicine

## 2024-01-07 ENCOUNTER — Encounter: Payer: Self-pay | Admitting: Cardiovascular Disease

## 2024-01-07 ENCOUNTER — Other Ambulatory Visit: Payer: Self-pay

## 2024-01-07 DIAGNOSIS — E1165 Type 2 diabetes mellitus with hyperglycemia: Secondary | ICD-10-CM

## 2024-01-07 DIAGNOSIS — Z87891 Personal history of nicotine dependence: Secondary | ICD-10-CM

## 2024-01-07 DIAGNOSIS — Z122 Encounter for screening for malignant neoplasm of respiratory organs: Secondary | ICD-10-CM

## 2024-01-08 ENCOUNTER — Other Ambulatory Visit: Payer: Self-pay

## 2024-01-08 MED ORDER — MICROLET LANCETS MISC: 12 refills | Status: DC

## 2024-01-08 MED ORDER — CONTOUR NEXT TEST VI STRP: ORAL_STRIP | 12 refills | Status: DC

## 2024-01-08 NOTE — Progress Notes (Unsigned)
 01/08/2024 Name: Richard Vazquez MRN: [id number] DOB: 1956/06/30  Chief Complaint  Patient presents with   Diabetes Management Plan   Richard Vazquez is a 68 y.o. year old male who presented for a telephone visit.   They were referred to the pharmacist by their PCP for assistance in managing diabetes.   Subjective:  Care Team: Primary Care Provider: Catheryn Cluck, MD ; Next Scheduled Visit: [id number]/72025  Medication Access/Adherence  Current Pharmacy:  [id number] - [place], [place] - [address] [place] [address] Phone: [phone] Fax: [phone]  [id number] - [place], [place] - [address] AT [facility] [address] [place] [address] Phone: [phone] Fax: [phone]  Patient reports affordability concerns with their medications: No  Patient reports access/transportation concerns to their pharmacy: No  Patient reports adherence concerns with their medications:  No    Diabetes: Current medications: Rybelsus  3mg  daily, metformin  XR 750mg  BID, glipizide  XL 2.5mg  daily, Current glucose readings: *** Using *** meter; testing *** times daily  Date of Download: *** % Time CGM is active: ***% Average Glucose: *** mg/dL Glucose Management Indicator: ***  Glucose Variability: *** (goal <36%) Time in Goal:  - Time in range 70-180: ***% - Time above range: ***% - Time below range: ***% Observed patterns:  Patient {Actions; denies-reports:120008} hypoglycemic s/sx including ***dizziness, shakiness, sweating. Patient {Actions; denies-reports:120008} hyperglycemic symptoms including ***polyuria, polydipsia, polyphagia, nocturia, neuropathy, blurred vision.  Current meal patterns:  - Breakfast: *** - Lunch *** - Supper *** - Snacks *** - Drinks ***  Current physical activity: ***  Current medication access support: ***   Objective:  Lab Results  Component Value  Date   HGBA1C 12.0 (A) 12/17/2023   Lab Results  Component Value Date   CREATININE 1.57 (H) 12/27/2023   BUN 30 (H) 12/27/2023   NA 135 12/27/2023   K 3.0 (L) 12/27/2023   CL 86 (L) 12/27/2023   CO2 28 12/27/2023   Medications Reviewed Today     Reviewed by Linn Rich, RPH (Pharmacist) on 01/08/24 at 1554  Med List Status: <None>   Medication Order Taking? Sig Documenting Provider Last Dose Status Informant  bisacodyl  (DULCOLAX) 5 MG EC tablet [id number] Yes Take 5 mg by mouth daily as needed for moderate constipation or severe constipation. [provider] Taking Active Self  Blood Glucose Monitoring Suppl [facility] BLOOD GLUCOSE SYSTEM) w/Device KIT [id number] Yes Use to check blood sugar Catheryn Cluck, MD Taking Active   cholecalciferol (VITAMIN D3) 25 MCG (1000 UNIT) tablet [id number] Yes Take 1,000 Units by mouth daily. [provider] Taking Active Self  diltiazem  (CARDIZEM  CD) 240 MG 24 hr capsule [id number]  TAKE 1 CAPSULE BY MOUTH DAILY Ursuy, Renee Lynn, PA-C  Active Self  ELIQUIS  5 MG TABS tablet [id number] Yes TAKE 1 TABLET BY MOUTH TWICE A DAY Elmyra Haggard, MD Taking Active Self  glipiZIDE  (GLIPIZIDE  XL) 2.5 MG 24 hr tablet [id number] Yes Take 1 tablet (2.5 mg total) by mouth daily with breakfast. Catheryn Cluck, MD Taking Active   indapamide  (LOZOL ) 2.5 MG tablet [id number]  Take 2.5 mg by mouth daily. [provider]  Active Self  Iron, Ferrous Sulfate , 325 (65 Fe) MG TABS [id number] Yes Take 65 mg by mouth every other day. [provider] Taking Active Self  Lancet Device MISC [id number] Yes 1 each by Does not apply  route in the morning, at noon, and at bedtime. May substitute to any manufacturer covered by patient's insurance. Catheryn Cluck, MD Taking Active   metFORMIN  (GLUCOPHAGE -XR) 750 MG 24 hr tablet [id number] Yes Take 2 tablets (1,500 mg total) by mouth daily with breakfast.  Patient taking differently: Take 750 mg by mouth  2 (two) times daily.   Catheryn Cluck, MD Taking Active   Multiple Vitamin (MULTIVITAMIN) tablet [id number] Yes Take 1 tablet by mouth daily. [provider] Taking Active Self           Med Note (MATTHEWS, SAMANTHA A   Tue Oct 30, 2023 10:57 AM)    nitroGLYCERIN  (NITROSTAT ) 0.4 MG SL tablet [id number]  Place 1 tablet (0.4 mg total) under the tongue every 5 (five) minutes as needed for chest pain (Max 3 doses). Elmyra Haggard, MD  Active Self           Med Note Ames Justin   Tue Jun 12, 2023  4:13 PM)    Omega-3 1000 MG CAPS [id number] Yes Take 1,000 mg by mouth daily. [provider] Taking Active Self  polyethylene glycol (MIRALAX  / GLYCOLAX ) 17 g packet [id number] Yes Take 17 g by mouth 2 (two) times daily. Catheryn Cluck, MD Taking Active   potassium chloride  SA (KLOR-CON  M) 20 MEQ tablet [id number] Yes Take 1 tablet (20 mEq total) by mouth daily.  Patient taking differently: Take 40 mEq by mouth daily.   Verona Goodwill, MD Taking Active   rosuvastatin  (CRESTOR ) 20 MG tablet [id number]  TAKE 1 TABLET BY MOUTH DAILY Elmyra Haggard, MD  Active Self  Semaglutide  (RYBELSUS ) 3 MG TABS [id number] Yes Take 1 tablet (3 mg total) by mouth daily. Catheryn Cluck, MD Taking Active   [facility] injection [id number]   [provider]  Active   tamsulosin [facility]) 0.4 MG CAPS capsule [id number]  Take 0.4 mg by mouth daily. [provider]  Active Self  torsemide  (DEMADEX ) 20 MG tablet [id number]  Take 1 tablet (20 mg total) by mouth daily. Verona Goodwill, MD  Active   triamcinolone  cream (KENALOG ) 0.1 % [id number]  Apply 1 Application topically 2 (two) times daily.  Patient taking differently: Apply 1 Application topically 2 (two) times daily as needed (Rash).   Catheryn Cluck, MD  Active Self           Assessment/Plan:   Diabetes: - Currently uncontrolled - Reviewed long term cardiovascular and renal outcomes of uncontrolled blood sugar - Reviewed goal  A1c, goal fasting, and goal 2 hour post prandial glucose - Reviewed dietary modifications including *** - Reviewed lifestyle modifications including:  - Recommend to ***  - Patient denies personal or family history of multiple endocrine neoplasia type 2, medullary thyroid  cancer; personal history of pancreatitis or gallbladder disease. - Recommend to check glucose *** - Meets financial criteria for *** patient assistance program through ***. Will collaborate with provider, CPhT, and patient to pursue assistance.   Follow Up Plan: ***  Linn Rich, PharmD, DPLA

## 2024-01-09 ENCOUNTER — Ambulatory Visit: Admitting: Podiatry

## 2024-01-09 ENCOUNTER — Encounter: Payer: Self-pay | Admitting: Podiatry

## 2024-01-09 ENCOUNTER — Ambulatory Visit: Admitting: Podiatrist

## 2024-01-09 VITALS — Ht 75.0 in | Wt 286.0 lb

## 2024-01-09 DIAGNOSIS — M79675 Pain in left toe(s): Secondary | ICD-10-CM

## 2024-01-09 DIAGNOSIS — B351 Tinea unguium: Secondary | ICD-10-CM

## 2024-01-09 DIAGNOSIS — L600 Ingrowing nail: Secondary | ICD-10-CM

## 2024-01-09 DIAGNOSIS — D689 Coagulation defect, unspecified: Secondary | ICD-10-CM | POA: Diagnosis not present

## 2024-01-09 DIAGNOSIS — M79674 Pain in right toe(s): Secondary | ICD-10-CM | POA: Diagnosis not present

## 2024-01-09 MED ORDER — CONTOUR NEXT TEST VI STRP: 1.0000 | ORAL_STRIP | Freq: Three times a day (TID) | 3 refills | Status: DC

## 2024-01-09 MED ORDER — LANCETS MICRO THIN 33G MISC: 3 refills | Status: DC

## 2024-01-09 MED ORDER — CONTOUR BLOOD GLUCOSE SYSTEM W/DEVICE KIT: PACK | 0 refills | Status: DC

## 2024-01-10 DIAGNOSIS — E876 Hypokalemia: Secondary | ICD-10-CM | POA: Diagnosis not present

## 2024-01-10 DIAGNOSIS — R7989 Other specified abnormal findings of blood chemistry: Secondary | ICD-10-CM | POA: Diagnosis not present

## 2024-01-10 NOTE — Progress Notes (Signed)
 Subjective:   Patient ID: Richard Vazquez, male   DOB: 68 y.o.   MRN: [id number]   HPI Patient presents with significant nail disease of all nails with the left big toenail being the worst.  He is also interested in a long-term procedure but would like to have a trend if possible.  Patient does not smoke tries to be active and is due to have a heart ablation in the near future on blood thinner   Review of Systems  All other systems reviewed and are negative.       Objective:  Physical Exam Vitals and nursing note reviewed.  Constitutional:      Appearance: He is well-developed.  Pulmonary:     Effort: Pulmonary effort is normal.  Musculoskeletal:        General: Normal range of motion.  Skin:    General: Skin is warm.  Neurological:     Mental Status: He is alert.     Neurovascular status was found to be intact with patient noted to have severe nail disease 1-5 both feet with the left big toenail being very damaged and painful.  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  Patient will be seen back as needed and we will judge whether or not nail surgery necessary.  Did educate him on the condition and what would be required

## 2024-01-10 NOTE — Telephone Encounter (Signed)
MyChart message sent to pt to inform.

## 2024-01-11 ENCOUNTER — Encounter: Payer: Self-pay | Admitting: Cardiovascular Disease

## 2024-01-11 LAB — BASIC METABOLIC PANEL WITH GFR
BUN/Creatinine Ratio: 14 (ref 10–24)
BUN: 20 mg/dL (ref 8–27)
CO2: 26 mmol/L (ref 20–29)
Calcium: 10 mg/dL (ref 8.6–10.2)
Chloride: 90 mmol/L — ABNORMAL LOW (ref 96–106)
Creatinine, Ser: 1.45 mg/dL — ABNORMAL HIGH (ref 0.76–1.27)
Glucose: 147 mg/dL — ABNORMAL HIGH (ref 70–99)
Potassium: 3.1 mmol/L — ABNORMAL LOW (ref 3.5–5.2)
Sodium: 136 mmol/L (ref 134–144)
eGFR: 52 mL/min/{1.73_m2} — ABNORMAL LOW (ref >59)

## 2024-01-16 ENCOUNTER — Ambulatory Visit: Payer: Self-pay

## 2024-01-18 ENCOUNTER — Encounter: Payer: Self-pay | Admitting: Internal Medicine

## 2024-01-18 ENCOUNTER — Telehealth: Payer: Self-pay | Admitting: Cardiovascular Disease

## 2024-01-18 MED ORDER — TORSEMIDE 20 MG PO TABS: 20.0000 mg | ORAL_TABLET | Freq: Every day | ORAL | 3 refills | Status: AC

## 2024-01-18 NOTE — Telephone Encounter (Signed)
 Spoke with patient, states he has started Rybelsus  (semaglutide ) tablets since getting his ablation letter. Instructed patient to hold Rybelsus  1 day prior to procedure, patient will not take any on Thursday May 29. No further needs

## 2024-01-18 NOTE — Telephone Encounter (Signed)
 Spoke with patient, confirmed he is taking his potassium 20 meq twice daily. No needs at this time

## 2024-01-18 NOTE — Telephone Encounter (Signed)
 Pt is scheduled to have an ablation done and end of month and has some more questions regarding his medications he is supposed to hold. Requesting cb

## 2024-01-22 ENCOUNTER — Telehealth (HOSPITAL_COMMUNITY): Payer: Self-pay

## 2024-01-22 NOTE — Telephone Encounter (Signed)
 Spoke with patient to discuss upcoming procedure.   CT: completed.  Labs: completed.   Any recent signs of acute illness or been started on antibiotics? No Any new medications started? No Any medications to hold? Rybelsus  Any missed doses of blood thinner?  No Advised patient to continue taking ANTICOAGULANT: Eliquis  (Apixaban ) twice daily without missing any doses.  Medication instructions:  On the morning of your procedure DO NOT take any medication., including Eliquis  or the procedure may be rescheduled. Nothing to eat or drink after midnight prior to your procedure.  Confirmed patient is scheduled for Atrial Fibrillation Ablation, Atrial Flutter Ablation on Friday, May 30 with Dr. Marlane Silver. Instructed patient to arrive at the Main Entrance A at [facility]: [address] [place], [place] [address] and check in at Admitting at 10:30 AM.  Advised of plan to go home the same day and will only stay overnight if medically necessary. You MUST have a responsible adult to drive you home and MUST be with you the first 24 hours after you arrive home or your procedure could be cancelled.  Patient verbalized understanding to all instructions provided and agreed to proceed with procedure.

## 2024-01-24 NOTE — Pre-Procedure Instructions (Signed)
 Instructed patient on the following items: Arrival time 1000 Nothing to eat or drink after midnight No meds AM of procedure Responsible person to drive you home and stay with you for 24 hrs  Have you missed any doses of anti-coagulant Elqiuis- takes twice a day, hasn't missed any doses.  Don't take dose morning of procedure.

## 2024-01-25 ENCOUNTER — Encounter (HOSPITAL_COMMUNITY)
Admission: RE | Disposition: A | Discharge status: Home / Self Care | Payer: Self-pay | Source: Home / Self Care | Attending: Cardiovascular Disease

## 2024-01-25 ENCOUNTER — Ambulatory Visit (HOSPITAL_COMMUNITY)

## 2024-01-25 ENCOUNTER — Other Ambulatory Visit: Payer: Self-pay

## 2024-01-25 ENCOUNTER — Ambulatory Visit (HOSPITAL_COMMUNITY)
Admission: RE | Admit: 2024-01-25 | Discharge: 2024-01-25 | Disposition: A | Discharge status: Home / Self Care | Attending: Cardiovascular Disease | Admitting: Cardiovascular Disease

## 2024-01-25 DIAGNOSIS — I110 Hypertensive heart disease with heart failure: Secondary | ICD-10-CM | POA: Insufficient documentation

## 2024-01-25 DIAGNOSIS — I2510 Atherosclerotic heart disease of native coronary artery without angina pectoris: Secondary | ICD-10-CM | POA: Insufficient documentation

## 2024-01-25 DIAGNOSIS — I5030 Unspecified diastolic (congestive) heart failure: Secondary | ICD-10-CM | POA: Insufficient documentation

## 2024-01-25 DIAGNOSIS — D6869 Other thrombophilia: Secondary | ICD-10-CM | POA: Diagnosis not present

## 2024-01-25 DIAGNOSIS — I480 Paroxysmal atrial fibrillation: Secondary | ICD-10-CM

## 2024-01-25 DIAGNOSIS — I4892 Unspecified atrial flutter: Secondary | ICD-10-CM

## 2024-01-25 DIAGNOSIS — G4730 Sleep apnea, unspecified: Secondary | ICD-10-CM | POA: Diagnosis not present

## 2024-01-25 DIAGNOSIS — I10 Essential (primary) hypertension: Secondary | ICD-10-CM | POA: Diagnosis not present

## 2024-01-25 DIAGNOSIS — I483 Typical atrial flutter: Secondary | ICD-10-CM | POA: Diagnosis not present

## 2024-01-25 DIAGNOSIS — Z6835 Body mass index (BMI) 35.0-35.9, adult: Secondary | ICD-10-CM | POA: Diagnosis not present

## 2024-01-25 DIAGNOSIS — Z87891 Personal history of nicotine dependence: Secondary | ICD-10-CM | POA: Diagnosis not present

## 2024-01-25 DIAGNOSIS — I484 Atypical atrial flutter: Secondary | ICD-10-CM | POA: Diagnosis not present

## 2024-01-25 DIAGNOSIS — J449 Chronic obstructive pulmonary disease, unspecified: Secondary | ICD-10-CM | POA: Insufficient documentation

## 2024-01-25 DIAGNOSIS — E119 Type 2 diabetes mellitus without complications: Secondary | ICD-10-CM | POA: Diagnosis not present

## 2024-01-25 DIAGNOSIS — Z7901 Long term (current) use of anticoagulants: Secondary | ICD-10-CM | POA: Insufficient documentation

## 2024-01-25 HISTORY — PX: A-FLUTTER ABLATION: EP1230

## 2024-01-25 HISTORY — PX: ATRIAL FIBRILLATION ABLATION: EP1191

## 2024-01-25 LAB — POCT ACTIVATED CLOTTING TIME
Activated Clotting Time: 274 s
Activated Clotting Time: 285 s
Activated Clotting Time: 348 s

## 2024-01-25 LAB — POCT I-STAT, CHEM 8
BUN: 18 mg/dL (ref 8–23)
Calcium, Ion: 1.19 mmol/L (ref 1.15–1.40)
Chloride: 104 mmol/L (ref 98–111)
Creatinine, Ser: 1.2 mg/dL (ref 0.61–1.24)
Glucose, Bld: 109 mg/dL — ABNORMAL HIGH (ref 70–99)
HCT: 34 % — ABNORMAL LOW (ref 39.0–52.0)
Hemoglobin: 11.6 g/dL — ABNORMAL LOW (ref 13.0–17.0)
Potassium: 3.8 mmol/L (ref 3.5–5.1)
Sodium: 143 mmol/L (ref 135–145)
TCO2: 26 mmol/L (ref 22–32)

## 2024-01-25 LAB — GLUCOSE, CAPILLARY
Glucose-Capillary: 127 mg/dL — ABNORMAL HIGH (ref 70–99)
Glucose-Capillary: 147 mg/dL — ABNORMAL HIGH (ref 70–99)

## 2024-01-25 MED ORDER — HEPARIN SODIUM (PORCINE) 1000 UNIT/ML IJ SOLN
INTRAMUSCULAR | Status: DC | PRN
  Administered 2024-01-25: 3000 [IU] via INTRAVENOUS
  Administered 2024-01-25: 18000 [IU] via INTRAVENOUS
  Administered 2024-01-25: 4000 [IU] via INTRAVENOUS

## 2024-01-25 MED ORDER — ATROPINE SULFATE 1 MG/ML IV SOLN
INTRAVENOUS | Status: DC | PRN
  Administered 2024-01-25: 1 mg via INTRAVENOUS

## 2024-01-25 MED ORDER — ACETAMINOPHEN 325 MG PO TABS
650.0000 mg | ORAL_TABLET | ORAL | Status: DC | PRN
  Administered 2024-01-25: 650 mg via ORAL

## 2024-01-25 MED ORDER — PHENYLEPHRINE 80 MCG/ML (10ML) SYRINGE FOR IV PUSH (FOR BLOOD PRESSURE SUPPORT)
PREFILLED_SYRINGE | INTRAVENOUS | Status: DC | PRN
Start: 2024-01-25 — End: 2024-01-26
  Administered 2024-01-25 (×2): 240 ug via INTRAVENOUS

## 2024-01-25 MED ORDER — HEPARIN (PORCINE) IN NACL 1000-0.9 UT/500ML-% IV SOLN
INTRAVENOUS | Status: DC | PRN
  Administered 2024-01-25 (×3): 500 mL

## 2024-01-25 MED ORDER — HEPARIN SODIUM (PORCINE) 1000 UNIT/ML IJ SOLN
INTRAMUSCULAR | Status: AC
  Filled 2024-01-25: qty 10

## 2024-01-25 MED ORDER — ONDANSETRON HCL 4 MG/2ML IJ SOLN: 4.0000 mg | Freq: Four times a day (QID) | INTRAMUSCULAR | Status: DC | PRN

## 2024-01-25 MED ORDER — SODIUM CHLORIDE 0.9 % IV SOLN
250.0000 mL | INTRAVENOUS | Status: DC | PRN
Start: 2024-01-25 — End: 2024-01-26

## 2024-01-25 MED ORDER — DEXAMETHASONE SODIUM PHOSPHATE 10 MG/ML IJ SOLN
INTRAMUSCULAR | Status: DC | PRN
Start: 2024-01-25 — End: 2024-01-26
  Administered 2024-01-25: 5 mg via INTRAVENOUS

## 2024-01-25 MED ORDER — MIDAZOLAM HCL 2 MG/2ML IJ SOLN
INTRAMUSCULAR | Status: DC | PRN
Start: 2024-01-25 — End: 2024-01-26
  Administered 2024-01-25: 2 mg via INTRAVENOUS

## 2024-01-25 MED ORDER — FENTANYL CITRATE (PF) 250 MCG/5ML IJ SOLN
INTRAMUSCULAR | Status: DC | PRN
  Administered 2024-01-25 (×2): 50 ug via INTRAVENOUS

## 2024-01-25 MED ORDER — ACETAMINOPHEN 325 MG PO TABS
ORAL_TABLET | ORAL | Status: DC
Start: 2024-01-25 — End: 2024-01-26
  Filled 2024-01-25: qty 2

## 2024-01-25 MED ORDER — PHENYLEPHRINE HCL-NACL 20-0.9 MG/250ML-% IV SOLN
INTRAVENOUS | Status: DC | PRN
Start: 2024-01-25 — End: 2024-01-26
  Administered 2024-01-25: 10 ug/min via INTRAVENOUS

## 2024-01-25 MED ORDER — NITROGLYCERIN 1 MG/10 ML FOR IR/CATH LAB
INTRA_ARTERIAL | Status: DC
Start: 2024-01-25 — End: 2024-01-26
  Filled 2024-01-25: qty 10

## 2024-01-25 MED ORDER — LIDOCAINE 2% (20 MG/ML) 5 ML SYRINGE
INTRAMUSCULAR | Status: DC | PRN
  Administered 2024-01-25: 60 mg via INTRAVENOUS

## 2024-01-25 MED ORDER — MIDAZOLAM HCL 2 MG/2ML IJ SOLN
INTRAMUSCULAR | Status: AC
  Filled 2024-01-25: qty 2

## 2024-01-25 MED ORDER — PROPOFOL 10 MG/ML IV BOLUS
INTRAVENOUS | Status: DC | PRN
Start: 2024-01-25 — End: 2024-01-26
  Administered 2024-01-25: 150 mg via INTRAVENOUS

## 2024-01-25 MED ORDER — SODIUM CHLORIDE 0.9 % IV SOLN: INTRAVENOUS | Status: DC

## 2024-01-25 MED ORDER — PROTAMINE SULFATE 10 MG/ML IV SOLN
INTRAVENOUS | Status: DC | PRN
  Administered 2024-01-25: 50 mg via INTRAVENOUS

## 2024-01-25 MED ORDER — SUGAMMADEX SODIUM 200 MG/2ML IV SOLN
INTRAVENOUS | Status: DC | PRN
  Administered 2024-01-25: 400 mg via INTRAVENOUS

## 2024-01-25 MED ORDER — ATROPINE SULFATE 1 MG/10ML IJ SOSY
PREFILLED_SYRINGE | INTRAMUSCULAR | Status: AC
  Filled 2024-01-25: qty 10

## 2024-01-25 MED ORDER — ROCURONIUM BROMIDE 10 MG/ML (PF) SYRINGE
PREFILLED_SYRINGE | INTRAVENOUS | Status: DC | PRN
  Administered 2024-01-25: 20 mg via INTRAVENOUS
  Administered 2024-01-25: 60 mg via INTRAVENOUS
  Administered 2024-01-25: 10 mg via INTRAVENOUS
  Administered 2024-01-25: 20 mg via INTRAVENOUS
  Administered 2024-01-25: 30 mg via INTRAVENOUS

## 2024-01-25 MED ORDER — ONDANSETRON HCL 4 MG/2ML IJ SOLN
INTRAMUSCULAR | Status: DC | PRN
  Administered 2024-01-25: 4 mg via INTRAVENOUS

## 2024-01-25 MED ORDER — SODIUM CHLORIDE 0.9% FLUSH: 3.0000 mL | Freq: Two times a day (BID) | INTRAVENOUS | Status: DC

## 2024-01-25 MED ORDER — NITROGLYCERIN 1 MG/10 ML FOR IR/CATH LAB
INTRA_ARTERIAL | Status: DC | PRN
  Administered 2024-01-25: 20 mL

## 2024-01-25 MED ORDER — FENTANYL CITRATE (PF) 100 MCG/2ML IJ SOLN
INTRAMUSCULAR | Status: AC
  Filled 2024-01-25: qty 2

## 2024-01-25 MED ORDER — SODIUM CHLORIDE 0.9% FLUSH: 3.0000 mL | INTRAVENOUS | Status: DC | PRN

## 2024-01-25 NOTE — Transfer of Care (Signed)
 Immediate Anesthesia Transfer of Care Note  Patient: Richard Vazquez  Procedure(s) Performed: ATRIAL FIBRILLATION ABLATION A-FLUTTER ABLATION  Patient Location: PACU  Anesthesia Type:General  Level of Consciousness: awake and alert   Airway & Oxygen Therapy: Patient Spontanous Breathing and Patient connected to nasal cannula oxygen  Post-op Assessment: Report given to RN and Post -op Vital signs reviewed and stable  Post vital signs: Reviewed and stable  Last Vitals:  Vitals Value Taken Time  BP    Temp    Pulse    Resp    SpO2      Last Pain:  Vitals:   01/25/24 1104  TempSrc:   PainSc: 0-No pain         Complications: No notable events documented.

## 2024-01-25 NOTE — Anesthesia Preprocedure Evaluation (Addendum)
 Anesthesia Evaluation  Patient identified by MRN, date of birth, ID band Patient awake    Reviewed: Allergy & Precautions, NPO status , Patient's Chart, lab work & pertinent test results  Airway Mallampati: II  TM Distance: >3 FB Neck ROM: Full    Dental  (+) Dental Advisory Given   Pulmonary sleep apnea and Continuous Positive Airway Pressure Ventilation , COPD, former smoker   breath sounds clear to auscultation       Cardiovascular hypertension, Pt. on medications + CAD  + dysrhythmias Atrial Fibrillation  Rhythm:Regular Rate:Normal     Neuro/Psych negative neurological ROS     GI/Hepatic negative GI ROS, Neg liver ROS,,,  Endo/Other  diabetes, Type 2    Renal/GU Renal disease     Musculoskeletal   Abdominal   Peds  Hematology  (+) Blood dyscrasia, anemia   Anesthesia Other Findings   Reproductive/Obstetrics                             Anesthesia Physical Anesthesia Plan  ASA: 3  Anesthesia Plan: General   Post-op Pain Management: Tylenol  PO (pre-op )*   Induction: Intravenous  PONV Risk Score and Plan: 1 and Ondansetron , Dexamethasone  and Treatment may vary due to age or medical condition  Airway Management Planned: Oral ETT  Additional Equipment:   Intra-op Plan:   Post-operative Plan: Extubation in OR  Informed Consent: I have reviewed the patients History and Physical, chart, labs and discussed the procedure including the risks, benefits and alternatives for the proposed anesthesia with the patient or authorized representative who has indicated his/her understanding and acceptance.     Dental advisory given  Plan Discussed with: CRNA  Anesthesia Plan Comments:        Anesthesia Quick Evaluation

## 2024-01-25 NOTE — Discharge Instructions (Signed)

## 2024-01-25 NOTE — H&P (Signed)
 Electrophysiology Office Note:    Date:  01/25/2024   ID:  Richard Vazquez, DOB February 22, 1956, MRN [id number]  PCP:  Catheryn Cluck, MD   [facility] Providers Cardiologist:  Ola Berger, MD Cardiology APP:  Gabino Joe, PA-C  Electrophysiologist:  Efraim Grange, MD     Referring MD: Arlester Ladd Donnamae Gaba, MD   History of Present Illness:    Richard Vazquez is a 68 y.o. male with a medical history significant for atrial flutter ablation May 2023., referred for management of atypical flutter and atrial fibrillation.     He underwent ablation for atrial flutter and indicated that there were multiple circuits identified but a stable counterclockwise substrate consistent with CTI dependent flutter which was ablated.  Unfortunately he had recurrence of flutter within a few weeks.  Flecainide  was started.  He has had multiple cardioversions since.  Flecainide  was stopped.  He has since been placed on dronedarone   I discussed the use of AI scribe software for clinical note transcription with the patient, who gave verbal consent to proceed.  He has a history of atrial fibrillation and underwent an ablation procedure in 2023. Despite the procedure, he continues to experience episodes of atrial fibrillation. He has been on various medications including flecainide , Multaq , and diltiazem , but is currently not on any antiarrhythmic medications. These medications were not effective in managing his condition.  During episodes of atrial fibrillation, he sometimes remains asymptomatic but often becomes easily winded and experiences shortness of breath, particularly during physical activities such as walking up stairs.  He is working on weight loss, using an exercise bike and planning to walk more as the weather improves. He is trying to make dietary changes, including reducing sugar intake and using Stevia as a sweetener.  He has a history of sleep apnea and uses a BiPAP machine consistently every  night.     Today, he reports that he feels well.  I reviewed the patient's CT and labs. There was no LAA thrombus. he  has not missed any doses of anticoagulation, and he took his dose last night. Since our last visit, he was diagnosed with diabetes and has started taking medications for this problem, which have been reviewed in the [name]. His potassium level was low, and his potassium supplementation was increased. There have been no changes in the patient's diagnoses, medications, or condition.  EKGs/Labs/Other Studies Reviewed Today:     Echocardiogram:  TTE 11/2021 EF 65-70%.  Normal mitral and aortic valves.  Normal left atrial size  TTE scheduled  Monitors:  14 day monitor June 2024-- my interpretation Sinus rhythm, heart rate 44 214 bpm 7.  Occasional SVE, 4.1% 3 atrial runs.  Rare PVCs.   Cardiac catherization  April 2023 Mild obstructive coronary disease. Normal LV EDP  EKG:       I reviewed all available ECGs in M[facility].  These show frequent atrial flutter ; I suspect there is also coarse atrial fibrillation  Physical Exam:    VS:  BP 129/66   Pulse 88   Temp 98.6 F (37 C) (Oral)   Resp 17   Ht 6\' 3"  (1.905 m)   Wt 130.2 kg   SpO2 96%   BMI 35.87 kg/m     Wt Readings from Last 3 Encounters:  01/25/24 130.2 kg  01/09/24 129.7 kg  01/01/24 129.7 kg     GEN: Well nourished, well developed in no acute distress CARDIAC: RRR, no murmurs, rubs, gallops RESPIRATORY:  Normal  work of breathing MUSCULOSKELETAL: trace edema    ASSESSMENT & PLAN:     Atypical Atrial flutter and atrial fibrillation History of CTI ablation in 2023 Multiple flutter circuits apparent during 2023 ablation Many ECGs have appearance of coarse AF to me We discussed the indication for mapping flutter and ablating AF/flutter.  Using a shared decision making approach, we elected to schedule the procedure.  We discussed the indication, rationale, logistics, anticipated benefits, and  potential risks of the ablation procedure including but not limited to -- bleed at the groin access site, chest pain, damage to nearby organs such as the diaphragm, lungs, or esophagus, need for a drainage tube, or prolonged hospitalization. I explained that the risk for stroke, heart attack, need for open chest surgery, or even death is very low but not zero. he  expressed understanding and wishes to proceed.   Morbid obesity We discussed the importance of weight loss at length and strategies including calorie counting  Sleep disordered breathing He is religious about using BiPap  Secondary hypercoagulable state Continue Eliquis  5 mg twice daily  Heart failure with preserved ejection fraction I think he will benefit from sinus rhythm With mild edema today, wearing compression socks    Signed, Efraim Grange, MD  01/25/2024 12:08 PM    [facility]

## 2024-01-25 NOTE — Anesthesia Procedure Notes (Signed)
 Procedure Name: Intubation Date/Time: 01/25/2024 1:30 PM  Performed by: Linard Reno, CRNAPre-anesthesia Checklist: Patient identified, Emergency Drugs available, Suction available and Patient being monitored Patient Re-evaluated:Patient Re-evaluated prior to induction Oxygen Delivery Method: Circle System Utilized Preoxygenation: Pre-oxygenation with 100% oxygen Induction Type: IV induction Ventilation: Mask ventilation without difficulty Laryngoscope Size: Mac and 4 Grade View: Grade I Tube type: Oral Tube size: 7.5 mm Number of attempts: 1 Airway Equipment and Method: Stylet and Oral airway Placement Confirmation: ETT inserted through vocal cords under direct vision, positive ETCO2 and breath sounds checked- equal and bilateral Secured at: 23 cm Tube secured with: Tape Dental Injury: Teeth and Oropharynx as per pre-operative assessment

## 2024-01-26 ENCOUNTER — Encounter (HOSPITAL_COMMUNITY): Payer: Self-pay | Admitting: Cardiovascular Disease

## 2024-01-26 NOTE — Anesthesia Postprocedure Evaluation (Signed)
 Anesthesia Post Note  Patient: Richard Vazquez  Procedure(s) Performed: ATRIAL FIBRILLATION ABLATION A-FLUTTER ABLATION     Patient location during evaluation: PACU Anesthesia Type: General Level of consciousness: awake and alert Pain management: pain level controlled Vital Signs Assessment: post-procedure vital signs reviewed and stable Respiratory status: spontaneous breathing, nonlabored ventilation, respiratory function stable and patient connected to nasal cannula oxygen Cardiovascular status: blood pressure returned to baseline and stable Postop Assessment: no apparent nausea or vomiting Anesthetic complications: no   No notable events documented.  Last Vitals:  Vitals:   01/25/24 1930 01/25/24 1940  BP:    Pulse: 71 74  Resp: 14 12  Temp:    SpO2: 90% 92%    Last Pain:  Vitals:   01/25/24 1715  TempSrc:   PainSc: 3    Pain Goal:                   Melvenia Stabs

## 2024-01-28 ENCOUNTER — Telehealth (HOSPITAL_COMMUNITY): Payer: Self-pay

## 2024-01-28 ENCOUNTER — Other Ambulatory Visit: Payer: Self-pay

## 2024-01-28 MED FILL — Fentanyl Citrate Preservative Free (PF) Inj 100 MCG/2ML: INTRAMUSCULAR | Qty: 2 | Status: AC

## 2024-01-28 NOTE — Telephone Encounter (Signed)
 Spoke with patient to complete post procedure follow up call.  Patient reports no complications with groin sites.   Instructions reviewed with patient:  It is normal to have bruising, tenderness, mild swelling, and a pea or marble sized lump/knot at the groin site which can take up to three months to resolve.  Get help right away if you notice sudden swelling at the puncture site.  Check your puncture site every day for signs of infection: fever, redness, swelling, pus drainage, warmth, foul odor or excessive pain. If this occurs, please call the office at [phone], to speak with the nurse. Get help right away if your puncture site is bleeding and the bleeding does not stop after applying firm pressure to the area.  You may continue to have skipped beats/ atrial fibrillation during the first several months after your procedure.  It is very important not to miss any doses of your blood thinner Eliquis . Patient restarted taking this medication on 01/25/24.   You will follow up with the Afib clinic on 02/22/24 and follow up with the APP on 04/29/24.   Patient verbalized understanding to all instructions provided.

## 2024-01-28 NOTE — Progress Notes (Signed)
   01/28/2024  Patient ID: Richard Vazquez, male   DOB: [birth year], 68 y.o.   MRN: [id number]  Diabetes: Current medications: Rybelsus  3mg  daily, metformin  XR 750mg  BID, glipizide  XL 2.5mg  daily -Current glucose readings: FBG averaging 100-110, pre-lunch BG averaging 130-190, bedtime BG averaging 100-110 -Using Contour Next meter; testing 4 times daily -Patient denies hypoglycemic s/sx including dizziness, shakiness, sweating.  -Patient denies hyperglycemic symptoms including polyuria, polydipsia, polyphagia, nocturia, neuropathy, blurred vision. -Patient continues to tolerate Rybelsus  3mg  daily well and has 6 tablets remaining- constipation has resolved with use of Miralax  and Bisacodyl  as needed -Endorses Rybelsus  affordable at $45 copay -A1c recently 12.0%, which was significantly increased from previous value of 6.4% in October 2024  Hypokalemia -Patient instructed to increase potassium to 40 mEq daily based on hypokalemia (K 3.0 on 12/27/23) -K on 01/25/2024 wnl at 3.9 -Patient inquiring if he should continue to take 40mEq daily or decrease dose back to 20mEq daily  Assessment/Plan:    Diabetes: -Currently uncontrolled but BG readings reflect improved control -Recommend patient complete Rybelsus  3mg  daily (6 more days), then increase to 7mg  daily  -Stop glipizide  XL if BG consistently <90 -Sees Dr. Hildy Lowers again 7/7 and will be due for A1c  Hypokalemia -Recommend patient continue to take potassium 40mEq until follow-up with cardiology 6/27 or Dr. Hildy Lowers 7/7 -Coordinating with providers to recommend order for f/u CMP to determine if potassium dose should be continued at 40mEq or decreased to 20mEq daily   Follow Up Plan: 3-4 weeks   Linn Rich, PharmD, DPLA

## 2024-01-29 MED ORDER — RYBELSUS 7 MG PO TABS: 7.0000 mg | ORAL_TABLET | Freq: Every day | ORAL | 1 refills | Status: DC

## 2024-02-21 ENCOUNTER — Other Ambulatory Visit: Payer: Self-pay

## 2024-02-21 NOTE — Progress Notes (Signed)
   02/21/2024  Patient ID: Richard Vazquez, male   DOB: 1956-02-11, 68 y.o.   MRN: [id number]  Diabetes: Current medications: Rybelsus  7mg  daily, metformin  XR 750mg  BID, glipizide  XL 2.5mg  daily -Rybelsus  dose increased to 7mg  approximately 3 weeks ago and patient is tolerating well with no adverse side effects -Current glucose readings: FBG averaging 100-130 -Patient denies hypoglycemic s/sx including dizziness, shakiness, sweating.  -Patient denies hyperglycemic symptoms including polyuria, polydipsia, polyphagia, nocturia, neuropathy, blurred vision. -Endorses Rybelsus  affordable at $45 copay - Last A1c12.0%, which was significantly increased from previous value of 6.4% in October 2024   Hypokalemia -Patient instructed to increase potassium to 40 mEq daily based on hypokalemia (K 3.0 on 12/27/23) -K on 01/25/2024 wnl at 3.9 -Patient inquiring if he should continue to take 40mEq daily or decrease dose back to 20mEq daily   Assessment/Plan:    Diabetes: -Currently uncontrolled but BG readings reflect improved control -Continue current regimen at this time -Sees Dr. Sebastian again 7/7 and will be due for A1c; I recommend considering increasing Rybelsus  to 14mg  daily if patient continues to tolerate; and stop glipizide  xl 2.5mg  daily if able based on A1c    Hypokalemia -Continue to take potassium 40mEq until follow-up with cardiology 6/27 or Dr. Sebastian 7/7 -Coordinating with providers to recommend order for f/u CMP to determine if potassium dose should be continued at 40mEq or decreased to 20mEq daily   Follow Up Plan: 4 weeks   Richard Vazquez, PharmD, DPLA

## 2024-02-22 ENCOUNTER — Ambulatory Visit (HOSPITAL_COMMUNITY)
Admission: RE | Admit: 2024-02-22 | Discharge: 2024-02-22 | Disposition: A | Discharge status: Home / Self Care | Source: Ambulatory Visit | Attending: Physician Assistant | Admitting: Physician Assistant

## 2024-02-22 ENCOUNTER — Encounter (HOSPITAL_COMMUNITY): Payer: Self-pay

## 2024-02-22 ENCOUNTER — Ambulatory Visit (HOSPITAL_COMMUNITY): Admitting: Physician Assistant

## 2024-02-22 VITALS — BP 122/62 | HR 84 | Ht 75.0 in | Wt 285.8 lb

## 2024-02-22 DIAGNOSIS — D6869 Other thrombophilia: Secondary | ICD-10-CM | POA: Diagnosis not present

## 2024-02-22 DIAGNOSIS — I484 Atypical atrial flutter: Secondary | ICD-10-CM | POA: Diagnosis not present

## 2024-02-22 DIAGNOSIS — I4891 Unspecified atrial fibrillation: Secondary | ICD-10-CM | POA: Diagnosis not present

## 2024-02-22 DIAGNOSIS — I480 Paroxysmal atrial fibrillation: Secondary | ICD-10-CM

## 2024-02-22 DIAGNOSIS — I4819 Other persistent atrial fibrillation: Secondary | ICD-10-CM

## 2024-02-22 NOTE — Addendum Note (Signed)
 Encounter addended by: Nellene Quita SAUNDERS, PA on: 02/22/2024 11:57 AM  Actions taken: Clinical Note Signed

## 2024-02-22 NOTE — Progress Notes (Addendum)
 Primary Care Physician: Sebastian Beverley NOVAK, MD Primary Cardiologist: Vina Gull, MD Electrophysiologist: Eulas FORBES Furbish, MD  Referring Physician: Charlies Arthur, PA-C     Richard Vazquez is a 68 y.o. male with a history of obesity, HLD, OSA, atrial flutter, atrial fibrillation, and CAD who presents for follow up in the [facility].  He has a history of atrial flutter ablation 01/16/2022 and had to be started on flecainide  after due to recurrence of arrhythmia. Seen by Charlies on 6/21 and noted to have new RBBB with lengthening of both PR and QRS intervals. Flecainide  was stopped and wearing cardiac monitor to determine flutter vs Afib. Patient is on Eliquis  for stroke prevention.   Patient returns for follow up for atrial fibrillation and atrial flutter. He is s/p afib ablation and repeat CTI ablation 01/25/24. He reports that he has done well since the procedure. He does not get as SOB on exertion and is able to do more on his stationary bike. No bleeding issues on anticoagulation. He denies chest pain or groin issues.   Today, he  denies symptoms of palpitations, chest pain, shortness of breath, orthopnea, PND, lower extremity edema, dizziness, presyncope, syncope, bleeding, or neurologic sequela. The patient is tolerating medications without difficulties and is otherwise without complaint today.    Atrial Fibrillation Risk Factors:  he does have symptoms or diagnosis of sleep apnea. he is compliant with CPAP therapy.  he has a BMI of Body mass index is 35.72 kg/m.SABRA Filed Weights   02/22/24 1123  Weight: 129.6 kg    Current Outpatient Medications  Medication Sig Dispense Refill   bisacodyl  (DULCOLAX) 5 MG EC tablet Take 5 mg by mouth daily as needed for moderate constipation or severe constipation. (Patient taking differently: Take 5 mg by mouth as needed for moderate constipation or severe constipation.)     Blood Glucose Monitoring Suppl (CONTOUR BLOOD GLUCOSE  SYSTEM) w/Device KIT Used to check blood sugar 1 kit 0   cholecalciferol (VITAMIN D3) 25 MCG (1000 UNIT) tablet Take 1,000 Units by mouth daily.     diltiazem  (CARDIZEM  CD) 240 MG 24 hr capsule TAKE 1 CAPSULE BY MOUTH DAILY 90 capsule 3   ELIQUIS  5 MG TABS tablet TAKE 1 TABLET BY MOUTH TWICE A DAY 180 tablet 3   glipiZIDE  (GLIPIZIDE  XL) 2.5 MG 24 hr tablet Take 1 tablet (2.5 mg total) by mouth daily with breakfast. 90 tablet 0   glucose blood (CONTOUR NEXT TEST) test strip 1 each by Other route 4 (four) times daily - after meals and at bedtime. Use as instructed 360 each 3   indapamide  (LOZOL ) 2.5 MG tablet Take 2.5 mg by mouth daily.     Iron, Ferrous Sulfate , 325 (65 Fe) MG TABS Take 65 mg by mouth every other day.     Lancets Micro Thin 33G MISC Use to check blood sugar 400 each 3   metFORMIN  (GLUCOPHAGE -XR) 750 MG 24 hr tablet Take 2 tablets (1,500 mg total) by mouth daily with breakfast. 180 tablet 0   Multiple Vitamin (MULTIVITAMIN) tablet Take 1 tablet by mouth daily.     nitroGLYCERIN  (NITROSTAT ) 0.4 MG SL tablet Place 1 tablet (0.4 mg total) under the tongue every 5 (five) minutes as needed for chest pain (Max 3 doses). 25 tablet 2   Omega-3 1000 MG CAPS Take 1,000 mg by mouth daily.     polyethylene glycol (MIRALAX  / GLYCOLAX ) 17 g packet Take 17 g by mouth 2 (two)  times daily. (Patient taking differently: Take 17 g by mouth daily.) 180 packet 3   potassium chloride  SA (KLOR-CON  M) 20 MEQ tablet Take 1 tablet (20 mEq total) by mouth daily. (Patient taking differently: Take 20 mEq by mouth 2 (two) times daily.) 90 tablet 1   rosuvastatin  (CRESTOR ) 20 MG tablet TAKE 1 TABLET BY MOUTH DAILY 90 tablet 3   Semaglutide  (RYBELSUS ) 7 MG TABS Take 1 tablet (7 mg total) by mouth daily. 30 tablet 1   tamsulosin (FLOMAX) 0.4 MG CAPS capsule Take 0.4 mg by mouth daily.     torsemide  (DEMADEX ) 20 MG tablet Take 1 tablet (20 mg total) by mouth daily. 90 tablet 3   triamcinolone  cream (KENALOG ) 0.1 %  Apply 1 Application topically 2 (two) times daily. 30 g 0   No current facility-administered medications for this encounter.    Atrial Fibrillation Management history:  Previous antiarrhythmic drugs: flecainide  Previous cardioversions: None Previous ablations: flutter ablation 01/16/2022, 01/25/24 Anticoagulation history: Eliquis    ROS- All systems are reviewed and negative except as per the HPI above.  Physical Exam: BP 122/62   Pulse 84   Ht 6' 3 (1.905 m)   Wt 129.6 kg   BMI 35.72 kg/m   GEN: Well nourished, well developed in no acute distress NECK: No JVD; No carotid bruits CARDIAC: Regular rate and rhythm, no murmurs, rubs, gallops RESPIRATORY:  Clear to auscultation without rales, wheezing or rhonchi  ABDOMEN: Soft, non-tender, non-distended EXTREMITIES:  No edema; No deformity    EKG today demonstrates  SR, RBBB, PAC Vent. rate 84 BPM PR interval 200 ms QRS duration 120 ms QT/QTcB 400/472 ms   Echo 11/22/23 demonstrated   1. Left ventricular ejection fraction, by estimation, is 60 to 65%. The  left ventricle has normal function. The left ventricle has no regional  wall motion abnormalities. There is mild concentric left ventricular  hypertrophy. Left ventricular diastolic  parameters are consistent with Grade I diastolic dysfunction (impaired  relaxation).   2. Right ventricular systolic function is normal. The right ventricular  size is normal. Tricuspid regurgitation signal is inadequate for assessing  PA pressure.   3. The mitral valve is normal in structure. No evidence of mitral valve  regurgitation. No evidence of mitral stenosis.   4. The aortic valve is tricuspid. There is mild calcification of the  aortic valve. Aortic valve regurgitation is not visualized. No aortic  stenosis is present.   5. Aortic dilatation noted. There is mild dilatation of the aortic root,  measuring 39 mm.   6. The IVC is not well-visualized.    CHA2DS2-VASc Score = 3   The patient's score is based upon: CHF History: 0 HTN History: 1 Diabetes History: 0 Stroke History: 0 Vascular Disease History: 1 Age Score: 1 Gender Score: 0       ASSESSMENT AND PLAN: Paroxysmal Atrial Fibrillation/typical and atypical atrial flutter The patient's CHA2DS2-VASc score is 3, indicating a 3.2% annual risk of stroke.   S/p flutter ablation 01/16/22. S/p afib and flutter ablation 01/25/24 Patient appears to be maintaining SR Continue diltiazem  240 mg daily Continue Eliquis  5 mg BID  Secondary Hypercoagulable State (ICD10:  D68.69) The patient is at significant risk for stroke/thromboembolism based upon his CHA2DS2-VASc Score of 3.  Continue Apixaban  (Eliquis ). No bleeding issues.   OSA  Encouraged nightly BiPAP  Chronic HFpEF EF 60-65% GDMT per primary cardiology team Fluid status appears stable today Check bmet today to follow up on K+ supplementation.  Obesity Body mass index is 35.72 kg/m.  Encouraged lifestyle modification On Rybelsus    Follow up with Charlies Arthur as scheduled.    Daril Kicks PA-C Afib Clinic [facility] [address] [place], [place] [address] [phone]

## 2024-02-23 LAB — BASIC METABOLIC PANEL WITH GFR
BUN/Creatinine Ratio: 19 (ref 10–24)
BUN: 24 mg/dL (ref 8–27)
CO2: 23 mmol/L (ref 20–29)
Calcium: 9.9 mg/dL (ref 8.6–10.2)
Chloride: 96 mmol/L (ref 96–106)
Creatinine, Ser: 1.29 mg/dL — ABNORMAL HIGH (ref 0.76–1.27)
Glucose: 164 mg/dL — ABNORMAL HIGH (ref 70–99)
Potassium: 3.4 mmol/L — ABNORMAL LOW (ref 3.5–5.2)
Sodium: 140 mmol/L (ref 134–144)
eGFR: 60 mL/min/{1.73_m2} (ref >59)

## 2024-02-25 ENCOUNTER — Ambulatory Visit (HOSPITAL_COMMUNITY): Payer: Self-pay | Admitting: Physician Assistant

## 2024-02-25 ENCOUNTER — Other Ambulatory Visit (HOSPITAL_COMMUNITY): Payer: Self-pay | Admitting: *Deleted

## 2024-02-25 MED ORDER — POTASSIUM CHLORIDE CRYS ER 20 MEQ PO TBCR: 20.0000 meq | EXTENDED_RELEASE_TABLET | Freq: Two times a day (BID) | ORAL | 2 refills | Status: AC

## 2024-02-26 DIAGNOSIS — E119 Type 2 diabetes mellitus without complications: Secondary | ICD-10-CM | POA: Diagnosis not present

## 2024-03-03 ENCOUNTER — Ambulatory Visit: Admitting: Family Medicine

## 2024-03-04 ENCOUNTER — Ambulatory Visit: Admitting: Family Medicine

## 2024-03-04 ENCOUNTER — Other Ambulatory Visit (HOSPITAL_COMMUNITY): Payer: Self-pay

## 2024-03-04 ENCOUNTER — Encounter: Payer: Self-pay | Admitting: Family Medicine

## 2024-03-04 ENCOUNTER — Telehealth: Payer: Self-pay

## 2024-03-04 VITALS — BP 123/64 | HR 77 | Temp 97.8°F | Ht 75.0 in | Wt 285.4 lb

## 2024-03-04 DIAGNOSIS — Z7985 Long-term (current) use of injectable non-insulin antidiabetic drugs: Secondary | ICD-10-CM

## 2024-03-04 DIAGNOSIS — E66812 Obesity, class 2: Secondary | ICD-10-CM

## 2024-03-04 DIAGNOSIS — Z1159 Encounter for screening for other viral diseases: Secondary | ICD-10-CM | POA: Diagnosis not present

## 2024-03-04 DIAGNOSIS — I4819 Other persistent atrial fibrillation: Secondary | ICD-10-CM | POA: Diagnosis not present

## 2024-03-04 DIAGNOSIS — E876 Hypokalemia: Secondary | ICD-10-CM

## 2024-03-04 DIAGNOSIS — Z23 Encounter for immunization: Secondary | ICD-10-CM

## 2024-03-04 DIAGNOSIS — E1165 Type 2 diabetes mellitus with hyperglycemia: Secondary | ICD-10-CM

## 2024-03-04 DIAGNOSIS — Z6835 Body mass index (BMI) 35.0-35.9, adult: Secondary | ICD-10-CM

## 2024-03-04 LAB — BASIC METABOLIC PANEL WITH GFR
BUN: 21 mg/dL (ref 6–23)
CO2: 30 meq/L (ref 19–32)
Calcium: 9.8 mg/dL (ref 8.4–10.5)
Chloride: 96 meq/L (ref 96–112)
Creatinine, Ser: 1.22 mg/dL (ref 0.40–1.50)
GFR: 61.03 mL/min (ref >60.00)
Glucose, Bld: 106 mg/dL — ABNORMAL HIGH (ref 70–99)
Potassium: 3.8 meq/L (ref 3.5–5.1)
Sodium: 139 meq/L (ref 135–145)

## 2024-03-04 LAB — MAGNESIUM: Magnesium: 1.9 mg/dL (ref 1.5–2.5)

## 2024-03-04 LAB — POCT GLYCOSYLATED HEMOGLOBIN (HGB A1C): Hemoglobin A1C: 6.5 % — AB (ref 4.0–5.6)

## 2024-03-04 MED ORDER — SEMAGLUTIDE (1 MG/DOSE) 4 MG/3ML ~~LOC~~ SOPN: 1.0000 mg | PEN_INJECTOR | SUBCUTANEOUS | 0 refills | Status: DC

## 2024-03-04 NOTE — Progress Notes (Signed)
 Sent PA for ozempic  to Rx PA team.

## 2024-03-04 NOTE — Progress Notes (Signed)
 Assessment & Plan   Assessment/Plan:     Assessment & Plan Type 2 Diabetes Mellitus Type 2 Diabetes Mellitus with significant improvement in glycemic control. Previous A1c was 12%, now reduced to 6.5% with metformin , glipizide , and Rybelsus . Currently on metformin  750 mg BID, glipizide  2.5 mg daily, and Rybelsus  7 mg daily. Plan to optimize weight management and glycemic control by transitioning from oral Rybelsus  to Ozempic  injections, which may offer additional cardiovascular and weight loss benefits. Discussed the potential for greater heart protection and weight loss with Ozempic  compared to Rybelsus , and the economic advantage of Ozempic . Explained that Ozempic  is a once-weekly injection with a slow-release mechanism, and is generally more economical than Rybelsus . - Discontinue glipizide  after completing current Rybelsus  tablets. - Prescribe Ozempic  1 mg injection once weekly. - Educate on potential benefits of Ozempic  for weight loss and cardiovascular protection. - Monitor weight and blood glucose levels. - Follow a low-carbohydrate diet.  Atrial Fibrillation Atrial fibrillation with recent episodes. Managed with Eliquis  and diltiazem . Expresses anxiety about exercise due to dyspnea, although recent walks have shown improvement. Also on torsemide  and indapamide  for fluid and blood pressure management. - Continue Eliquis  and diltiazem . - Follow up with the atrial fibrillation clinic for ongoing management. - Encourage gradual increase in physical activity as tolerated.  Hypokalemia Potassium level is borderline low at 3.4 mmol/L. On Chlorcon 20 mg BID. Concern that low magnesium levels may affect potassium absorption. - Repeat metabolic panel and check magnesium levels. - Consider adjusting potassium supplementation if levels remain low.  General Health Maintenance Has not received a pneumonia vaccine, which is recommended due to diabetes and heart disease. Has received influenza  and shingles vaccines. - Administer pneumonia vaccine today. - Verify and document shingles vaccination records from [employer].      Medications Discontinued During This Encounter  Medication Reason   Semaglutide  (RYBELSUS ) 7 MG TABS    glipiZIDE  (GLIPIZIDE  XL) 2.5 MG 24 hr tablet     Return in about 3 months (around 06/04/2024) for diabetes.        Subjective:   Encounter date: 03/04/2024  Richard Vazquez is a 68 y.o. male who has Atrial flutter (HCC); Hyperlipemia; Hematuria; Carotid artery disease (HCC); Persistent atrial fibrillation (HCC); CAD (coronary artery disease); Shift work sleep disorder; OSA (obstructive sleep apnea); Class 2 severe obesity due to excess calories with serious comorbidity and body mass index (BMI) of 35.0 to 35.9 in adult [facility]); Renal stones; Prediabetes; Chronic constipation; Rash; Dyspnea on exertion; Bilateral edema of lower extremity; Vitamin D  deficiency; Psoriasis; Venous stasis of both lower extremities; Varicose veins of left lower extremity with pain; Absolute anemia; Benign prostatic hyperplasia with urinary frequency; Primary hypertension; Type 2 diabetes mellitus with hyperglycemia, without long-term current use of insulin  (HCC); and Hypokalemia on their problem list..   He  has a past medical history of Acute pain of right knee (05/16/2022), Atrial fibrillation (HCC), Carotid artery disease (HCC), Colon polyps, COPD (chronic obstructive pulmonary disease) (HCC), Former smoker (12/21/2015), Hyperlipemia, Hypertension (Not sure), Kidney stones, Left carotid bruit (08/05/2021), Obesity, Paroxysmal atrial flutter (HCC) (05/29/2021), Sleep apnea (February 2023), and Sleep apnea treated with nocturnal bilevel positive airway pressure (BPAP).SABRA   He presents with chief complaint of Diabetes (32mo f/u; doing okay, no concerns) .   Discussed the use of AI scribe software for clinical note transcription with the patient, who gave verbal consent to  proceed.  History of Present Illness Richard Vazquez is a 68 year old male with diabetes and  atrial fibrillation who presents for follow-up of his diabetes management.  He has a history of diabetes with a recent A1c of 12 two months ago. He was started on metformin  750 mg BID extended release and glipizide  2.5 mg daily with breakfast, which improved his A1c to 6.5. He also started Rybelsus  7.5 mg recently. He takes metformin  750 mg twice daily as the 1500 mg dose once daily caused stomach discomfort.  He has a history of atrial fibrillation and experiences shortness of breath during physical activity, although it has improved slightly. He has had a couple of readings of Afib in the last week. He is currently on Eliquis  and diltiazem  for atrial fibrillation management, as well as torsemide  for fluid management and indapamide  for blood pressure. He feels anxious about exercising due to shortness of breath.  He reports a decrease in nighttime urination over the past few weeks, attributing it to torsemide  taken at night. His potassium level is low at 3.4, and he is on Chlorcon 20 mg twice a day for supplementation.  He mentions a heart murmur noted at the AFib clinic, but his EKG was reported as good. He has received flu and shingles vaccinations but not the pneumonia vaccine.     ROS  Past Surgical History:  Procedure Laterality Date   A-FLUTTER ABLATION N/A 01/16/2022   Procedure: A-FLUTTER ABLATION;  Surgeon: Fernande Elspeth BROCKS, MD;  Location: [facility] INVASIVE CV LAB;  Service: Cardiovascular;  Laterality: N/A;   A-FLUTTER ABLATION N/A 01/25/2024   Procedure: A-FLUTTER ABLATION;  Surgeon: Nancey Eulas BRAVO, MD;  Location: [facility] INVASIVE CV LAB;  Service: Cardiovascular;  Laterality: N/A;   APPENDECTOMY  1971   ATRIAL FIBRILLATION ABLATION N/A 01/25/2024   Procedure: ATRIAL FIBRILLATION ABLATION;  Surgeon: Nancey Eulas BRAVO, MD;  Location: [facility] INVASIVE CV LAB;  Service: Cardiovascular;  Laterality: N/A;    CARDIOVERSION N/A 05/31/2021   Procedure: CARDIOVERSION;  Surgeon: Mona Vinie BROCKS, MD;  Location: [facility] ENDOSCOPY;  Service: Cardiovascular;  Laterality: N/A;   CARDIOVERSION N/A 10/05/2021   Procedure: CARDIOVERSION;  Surgeon: Mona Vinie BROCKS, MD;  Location: [facility] ENDOSCOPY;  Service: Cardiovascular;  Laterality: N/A;   CARDIOVERSION N/A 11/01/2023   Procedure: CARDIOVERSION;  Surgeon: Santo Stanly LABOR, MD;  Location: [facility] INVASIVE CV LAB;  Service: Cardiovascular;  Laterality: N/A;   COLONOSCOPY     LEFT HEART CATH AND CORONARY ANGIOGRAPHY N/A 12/07/2021   Procedure: LEFT HEART CATH AND CORONARY ANGIOGRAPHY;  Surgeon: Wendel Lurena POUR, MD;  Location: [facility] INVASIVE CV LAB;  Service: Cardiovascular;  Laterality: N/A;   LITHOTRIPSY     multiple   TEE WITHOUT CARDIOVERSION N/A 05/31/2021   Procedure: TRANSESOPHAGEAL ECHOCARDIOGRAM (TEE);  Surgeon: Mona Vinie BROCKS, MD;  Location: [facility] ENDOSCOPY;  Service: Cardiovascular;  Laterality: N/A;    Outpatient Medications Prior to Visit  Medication Sig Dispense Refill   bisacodyl  (DULCOLAX) 5 MG EC tablet Take 5 mg by mouth daily as needed for moderate constipation or severe constipation. (Patient taking differently: Take 5 mg by mouth as needed for moderate constipation or severe constipation.)     Blood Glucose Monitoring Suppl (CONTOUR BLOOD GLUCOSE SYSTEM) w/Device KIT Used to check blood sugar 1 kit 0   cholecalciferol (VITAMIN D3) 25 MCG (1000 UNIT) tablet Take 1,000 Units by mouth daily.     diltiazem  (CARDIZEM  CD) 240 MG 24 hr capsule TAKE 1 CAPSULE BY MOUTH DAILY 90 capsule 3   ELIQUIS  5 MG TABS tablet TAKE 1 TABLET BY MOUTH TWICE A DAY 180 tablet  3   glucose blood (CONTOUR NEXT TEST) test strip 1 each by Other route 4 (four) times daily - after meals and at bedtime. Use as instructed 360 each 3   indapamide  (LOZOL ) 2.5 MG tablet Take 2.5 mg by mouth daily.     Iron, Ferrous Sulfate , 325 (65 Fe) MG TABS Take 65 mg by mouth every other day.      Lancets Micro Thin 33G MISC Use to check blood sugar 400 each 3   metFORMIN  (GLUCOPHAGE -XR) 750 MG 24 hr tablet Take 2 tablets (1,500 mg total) by mouth daily with breakfast. (Patient taking differently: Take 750 mg by mouth 2 (two) times daily.) 180 tablet 0   Multiple Vitamin (MULTIVITAMIN) tablet Take 1 tablet by mouth daily.     nitroGLYCERIN  (NITROSTAT ) 0.4 MG SL tablet Place 1 tablet (0.4 mg total) under the tongue every 5 (five) minutes as needed for chest pain (Max 3 doses). 25 tablet 2   Omega-3 1000 MG CAPS Take 1,000 mg by mouth daily.     polyethylene glycol (MIRALAX  / GLYCOLAX ) 17 g packet Take 17 g by mouth 2 (two) times daily. (Patient taking differently: Take 17 g by mouth daily.) 180 packet 3   potassium chloride  SA (KLOR-CON  M) 20 MEQ tablet Take 1 tablet (20 mEq total) by mouth 2 (two) times daily. 180 tablet 2   rosuvastatin  (CRESTOR ) 20 MG tablet TAKE 1 TABLET BY MOUTH DAILY 90 tablet 3   tamsulosin (FLOMAX) 0.4 MG CAPS capsule Take 0.4 mg by mouth daily.     torsemide  (DEMADEX ) 20 MG tablet Take 1 tablet (20 mg total) by mouth daily. 90 tablet 3   triamcinolone  cream (KENALOG ) 0.1 % Apply 1 Application topically 2 (two) times daily. 30 g 0   glipiZIDE  (GLIPIZIDE  XL) 2.5 MG 24 hr tablet Take 1 tablet (2.5 mg total) by mouth daily with breakfast. 90 tablet 0   Semaglutide  (RYBELSUS ) 7 MG TABS Take 1 tablet (7 mg total) by mouth daily. 30 tablet 1   No facility-administered medications prior to visit.    Family History  Problem Relation Age of Onset   Diabetes Mother    Cancer Mother    Heart disease Mother    Hyperlipidemia Mother    Hypertension Mother    Miscarriages / [place] Mother    Stroke Mother    Varicose Veins Mother    Heart disease Father        smoker   Aortic stenosis Father    Hyperlipidemia Father    Hypertension Father    Ovarian cancer Sister    Colon cancer Paternal Uncle    Aneurysm Paternal Uncle        Brain   Cancer Paternal Uncle         all in abdomin, ? colon cancer   Asthma Sister    Cancer Paternal Uncle    Early death Paternal Aunt    Early death Paternal Uncle    Stomach cancer Neg Hx    Rectal cancer Neg Hx     Social History   Socioeconomic History   Marital status: Married    Spouse name: Not on file   Number of children: 2   Years of education: Not on file   Highest education level: 12th grade  Occupational History   Occupation: retired  Tobacco Use   Smoking status: Former    Current packs/day: 0.00    Average packs/day: 1.5 packs/day for 40.0 years (60.0 ttl pk-yrs)  Types: Cigarettes    Start date: 12/09/1979    Quit date: 12/09/2019    Years since quitting: 4.2    Passive exposure: Past   Smokeless tobacco: Never  Vaping Use   Vaping status: Never Used  Substance and Sexual Activity   Alcohol use: Not Currently   Drug use: Never   Sexual activity: Not Currently  Other Topics Concern   Not on file  Social History Narrative   Not on file   Social Drivers of Health   Financial Resource Strain: Low Risk  (02/29/2024)   Overall Financial Resource Strain (CARDIA)    Difficulty of Paying Living Expenses: Not hard at all  Food Insecurity: No Food Insecurity (02/29/2024)   Hunger Vital Sign    Worried About Running Out of Food in the Last Year: Never true    Ran Out of Food in the Last Year: Never true  Transportation Needs: No Transportation Needs (02/29/2024)   PRAPARE - Administrator, Civil Service (Medical): No    Lack of Transportation (Non-Medical): No  Physical Activity: Sufficiently Active (12/11/2023)   Exercise Vital Sign    Days of Exercise per Week: 6 days    Minutes of Exercise per Session: 30 min  Stress: No Stress Concern Present (02/29/2024)   [employer] of Occupational Health - Occupational Stress Questionnaire    Feeling of Stress: Not at all  Social Connections: Unknown (02/29/2024)   Social Connection and Isolation Panel    Frequency of Communication  with Friends and Family: Patient declined    Frequency of Social Gatherings with Friends and Family: Patient declined    Attends Religious Services: Never    Database administrator or Organizations: No    Attends Engineer, structural: Not on file    Marital Status: Married  Catering manager Violence: Not At Risk (04/16/2023)   Humiliation, Afraid, Rape, and Kick questionnaire    Fear of Current or Ex-Partner: No    Emotionally Abused: No    Physically Abused: No    Sexually Abused: No                                                                                                  Objective:  Physical Exam: BP 123/64   Pulse 77   Temp 97.8 F (36.6 C)   Ht 6' 3 (1.905 m)   Wt 285 lb 6.4 oz (129.5 kg)   SpO2 96%   BMI 35.67 kg/m   Wt Readings from Last 3 Encounters:  03/04/24 285 lb 6.4 oz (129.5 kg)  02/22/24 285 lb 12.8 oz (129.6 kg)  01/25/24 287 lb (130.2 kg)    Physical Exam MEASUREMENTS: BMI- 35.6. GENERAL: Alert, cooperative, well developed, no acute distress. HEENT: Normocephalic, normal oropharynx, moist mucous membranes. CHEST: Clear to auscultation bilaterally, no wheezes, rhonchi, or crackles. CARDIOVASCULAR: Normal heart rate and rhythm, S1 and S2 normal without murmurs. ABDOMEN: Soft, non-tender, non-distended, without organomegaly, normal bowel sounds. EXTREMITIES: Edema managed with compression stockings NEUROLOGICAL: Cranial nerves grossly intact, moves all extremities without gross motor or sensory deficit.  EP STUDY Result Date: 01/25/2024 SURGEON:  Eulas FORBES [place], MD PREPROCEDURE DIAGNOSES: 1. Atrial flutter 2. History of paroxysmal atrial flutter POSTPROCEDURE DIAGNOSES: 1. Paroxysmal atrial fibrillation. 2. Typical CTI-dependent atrial flutter 3. Atypical atrial flutter PROCEDURES: 1. Electrophysiologic study. 2. Coronary sinus pacing and recording. 3.  Induction, mapping, and ablation of typical atrial flutter 4.  Induction, mapping,  and ablation of atypical biatrial flutter 5. Ablation of the pulmonary veins with pulsed field ablation 4. Ablation of the posterior wall of the left atrium with pulsed field ablation 5. Intracardiac echocardiography. 6. Transseptal puncture of an intact septum. INTRODUCTION:  Karsyn Rochin is a 68 y.o. male with a history of paroxysmal atrial fibrillation who now presents for EP study and pulsed field energy ablation . DESCRIPTION OF PROCEDURE:  Informed written consent was obtained, and the patient was brought to the electrophysiology lab in a fasting state.  The patient was adequately sedated with intravenous medications as outlined in the anesthesia report.  The patient's left and right groins were prepped and draped in the usual sterile fashion by the EP lab staff.  Using a percutaneous Seldinger technique, 1 9-French hemostasis sheaths was placed in the right femoral vein, and one 9-French and one 8-French  hemostasis sheath were placed into the left common femoral vein. Direct ultrasound guidance was used for right and left femoral veins with normal vessel patency. Using ultrasound guidance, the Brockenbrough needle and wire were visualized entering the vessel.  Initial Measurements: The patient presented to the electrophysiology lab in sinus rhythm. his PR interval measured 208 msec with a QRS duration of 119 msec and a QT interval of 392 msec.  Intracardiac Echocardiography: An 8-French Biosense Webster AcuNav intracardiac echocardiography catheter was introduced through the left common femoral vein and advanced into the right atrium. Intracardiac echocardiography was performed of the left atrium, and a three-dimensional anatomical rendering of the left atrium was performed using CARTO sound technology.  The interatrial septum was lipomatous and extremely thick, measuring approximately 3 cm on ICE.  The fossa was very small.  Due to the thickness of the septum, I could not visualize the left atrium with ice.   Catheter Placement:  A 7-French Biosense Webster Decapolar coronary sinus catheter was introduced through the right common femoral vein and advanced into the coronary sinus for recording and pacing from this location. Induction, mapping, and ablation of typical, CTI dependent flutter: Typical atrial flutter occurred spontaneously from premature beats that occurred during catheter placement.  The flutter cycle length was approximately 290 ms.  CS activation was from the proximal to distal poles.  We mapped the rhythm with the Octarray catheter; the pattern of activation was consistent with a typical CTI dependent flutter.  The voltage map showed a small area of conduction in the middle of the cavotricuspid isthmus.  An 8 mm nonirrigated catheter was advanced to the right atrium.  Sound maps were obtained of the cavotricuspid isthmus.  We began to deliver lesions at the below the CTI in the area of obvious breakthrough.  The flutter terminated very easily.  Then, we continued to deliver lesions in this area, resulting in a CTI transit time of 150 ms. Induction, mapping of atypical flutter: We then performed additional burst pacing and induced a narrow complex tachycardia, a 2-1 flutter, with atrial burst pacing at approximately 2 and 30 ms.  The cycle length of the flutter was 240 ms.  Coronary sinus activation process from proximal to distal.  We performed activation mapping in  the right atrium.  This appeared to show a pattern of activation earliest at the the base of the SVC conducting down the lateral wall and across the septum.  There is no discrete area of early activation.  There were areas of fractionated symptoms and double potentials on the septal aspect of the SVC/RA junction and along the upper portion of the septum.  I then decided to attempt a transseptal puncture and mapped the left atrium. Transseptal Puncture: Intravenous heparin  was administered to achieve a goal ACT of 300-350 seconds. The right  common femoral vein sheath was exchanged for Faradrive connect sheath and transseptal access was achieved in a standard fashion using a Bayless pigtail wire with intracardiac echocardiography.  This was very difficult due to the thickness of the patient's septum, poor ICE views, and small fossa. 3D Mapping and Ablation: A  Biosense Webster Octarray catheter was advanced into the left atrium through the Faradrive sheath. A 3 D electroanatomical map was obtained. This revealed a left common pulmonary vein and 2 discrete right pulmonary veins.  We performed extensive activation mapping of the left atrium.  Earliest activation in the left atrium was on the septal portion of the mitral annulus, conducting up to the septum and dispersing across the remainder of the atrium.  We did not capture the entire cycle length of the rhythm in the left atrium, nor was there a discrete point focus. Watching both the right atrial and left atrial activation maps as a whole, there was a clear pattern of cyclical activation in the circuit from the superior vena cava down the septum of the atrium, crossing over to the left atrium in the area of the lower septum, then coursing back up the septal side of the left atrium and emerging again in the SVC.  I do not see a discrete obvious target for ablation.  Due to the thickness of the interatrial septum, I thought the likelihood of full-thickness ablation would be very low, though an attempt could be made with pulsed field energy.  I thought it would be reasonable to attempt ablation of the right superior pulmonary vein (as well as the other pulmonary veins due to a reported history of PAF) and an anterior mitral isthmus line, which if successful, would potentially interrupt the flutter circuit.   The mapping catheter was then removed and the Farapulse catheter advanced to the left atrium over its wire. Sequential electrical ablation of all four pulmonary veins was then performed using pulsed  field energy.  Isolation of the right.  Pulmonary vein did not affect the tachycardia.  Because there was a fairly short space between pulmonary veins, I also decided to ablate the posterior wall with pulsed field energy.  I then attempted to ablate an anterior mitral line, but due to the thickness of the atrial septum and the position of the fossa, I was not able to retroflex the Faradrive sheath to position the ablation catheter on the septum.    A total of 52 pulses were delivered.  Cardioversion: The patient was then cardioverted to sinus rhythm with a single synchronized 200-J biphasic shock with cardioversion electrodes in the anterior-posterior thoracic configuration. He remained in sinus rhythm thereafter. Prior to the conclusion of the case, we again tested the CTI.  At this point, CTI transit time was approximately 90 ms, indicating reconnection of conduction.  I then opted to use the Farapulse catheter to deliver additional ablation lesions.  The wire was delivered to the superior vena cava  and the Farapulse placed in flower figuration and then retroflexed down to the CTI line.  ICE and electroanatomic mapping was used to confirm that we were on the lateral portion of the CTI, well away from the AV node.  A series of pulses were delivered between the tricuspid valve and the inferior vena cava.  After ablation, CTI transit time was approximately 140 ms. Measurements Following Ablation: In sinus rhythm with RR interval was 701 msec, with PR 191 msec, QRS 122 msec, and QT 406 msec.  Rapid atrial pacing was performed, which revealed an AV Wenckebach cycle length of 440 msec.  The procedure was therefore considered completed.  All catheters were removed, and the sheaths were aspirated and flushed. The right femoral venous access was closed with a two perclose devices and the left femoral venous accesses with Mynx devices.  EBL<74ml.  Intracardiac echocardiogram revealed no pericardial effusion.  There were no  early apparent complications. CONCLUSIONS: 1. Sinus rhythm upon presentation.  2.  Induction and successful ablation of CTI dependent atrial flutter 3.  Induction of an atypical flutter without targets for ablation 4.  DC cardioversion 5. successful ablation of all four pulmonary veins with pulsed field energy to address potential triggers for flutter and possible atrial fibrillation 6. Successful ablation of the posterior wall of the left atrium with pulsed field energy 7. No early apparent complications. Eulas BRAVO Mealor,MD 7:06 PM 01/25/2024    CT CARDIAC MORPH/PULM VEIN W/CM&W/O CA SCORE Addendum Date: 01/13/2024 ADDENDUM REPORT: 01/13/2024 20:45 EXAM: OVER-READ INTERPRETATION  CT CHEST The following report is an over-read performed by radiologist Dr. Oneil Devonshire of [facility] on 01/13/2024. This over-read does not include interpretation of cardiac or coronary anatomy or pathology. The coronary calcium  score/coronary CTA interpretation by the cardiologist is attached. COMPARISON:  12/04/2023 FINDINGS: Cardiovascular: There are no significant extracardiac vascular findings. Prominent lipoma is noted along the posterior aspect of the right ventricle stable from the prior exam. Mediastinum/Nodes: There are no enlarged lymph nodes within the visualized mediastinum. Lungs/Pleura: There is no pleural effusion. Lingular atelectasis is noted stable from the prior exam. Upper abdomen: No significant findings in the visualized upper abdomen. Musculoskeletal/Chest wall: No chest wall mass or suspicious osseous findings within the visualized chest. IMPRESSION: No significant extracardiac findings within the visualized chest. Electronically Signed   By: Oneil Devonshire M.D.   On: 01/13/2024 20:45   Result Date: 01/13/2024 CLINICAL DATA:  Atrial fibrillation scheduled for ablation. EXAM: Cardiac CTA TECHNIQUE: A non-contrast, gated CT scan was obtained with axial slices of 2.5 mm through the heart for calcium   scoring. Calcium  scoring was performed using the Agatston method. A 120 kV prospective, gated, contrast cardiac scan was obtained. Gantry rotation speed was 230 msec and collimation was 0.63 mm. Nitroglycerin  was not given. A delayed scan was obtained to exclude left atrial appendage thrombus. The 3D dataset was reconstructed in systole with motion correction. The 3D dataset was reconstructed at 5% intervals of the 25%-50% of the R-R cycle. Images were analyzed on a dedicated workstation using MPR, MIP, and VRT modes. The patient received 95 cc of contrast. FINDINGS: Image quality: Excellent. Noise artifact is: Limited. There is normal pulmonary vein drainage into the left atrium (2 on the right and 2 on the left). No anomalous pulmonary venous drainage. No pulmonary vein stenosis. Normal left atrial appendage, no left atrial appendage thrombus. No intracardiac mass or thrombus. Severely lipomatous interatrial septum with no PFO or ASD. There is a small area  of thin atrial septum to facilitate transseptal puncture, approximately 10 x 9 mm. The esophagus runs adjacent to the right lower pulmonary vein. Aorta: Normal caliber.  No dissection or calcifications. Aortic Valve: No calcifications. Coronary Arteries: Normal coronary origin. Right dominance. The study was performed without use of NTG and insufficient for plaque evaluation. Coronary artery calcium  score is 85, which is 45th percentile for age and sex matched peers. Pulmonary artery: Borderline normal caliber main pulmonary artery, 29 mm IMPRESSION: 1. There is normal pulmonary vein drainage into the left atrium. No pulmonary vein stenosis. 2. Normal left atrial appendage, no left atrial appendage thrombus. No intracardiac mass or thrombus. 3. The esophagus runs adjacent to the right lower pulmonary vein. 4. Severely lipomatous interatrial septum. There is a small area of thin atrial septum at fossa ovalis to facilitate transseptal puncture, approximately 10 x  9 mm. 5. Coronary artery calcium  score is 85, which is 45th percentile for age and sex matched peers. Electronically Signed: By: Soyla Merck M.D. On: 01/07/2024 11:00    Recent Results (from the past 2160 hours)  POC CBG, ED     Status: Abnormal   Collection Time: 12/16/23 11:25 AM  Result Value Ref Range   Glucose-Capillary 495 (H) 70 - 99 mg/dL    Comment: Glucose reference range applies only to samples taken after fasting for at least 8 hours.   Comment 1 Notify RN   CBC with Differential     Status: None   Collection Time: 12/16/23 11:32 AM  Result Value Ref Range   WBC 7.9 4.0 - 10.5 K/uL   RBC 4.96 4.22 - 5.81 MIL/uL   Hemoglobin 15.5 13.0 - 17.0 g/dL   HCT 55.2 60.9 - 47.9 %   MCV 90.1 80.0 - 100.0 fL   MCH 31.3 26.0 - 34.0 pg   MCHC 34.7 30.0 - 36.0 g/dL   RDW 86.0 88.4 - 84.4 %   Platelets 232 150 - 400 K/uL   nRBC 0.0 0.0 - 0.2 %   Neutrophils Relative % 71 %   Neutro Abs 5.6 1.7 - 7.7 K/uL   Lymphocytes Relative 14 %   Lymphs Abs 1.1 0.7 - 4.0 K/uL   Monocytes Relative 9 %   Monocytes Absolute 0.7 0.1 - 1.0 K/uL   Eosinophils Relative 4 %   Eosinophils Absolute 0.4 0.0 - 0.5 K/uL   Basophils Relative 1 %   Basophils Absolute 0.1 0.0 - 0.1 K/uL   Immature Granulocytes 1 %   Abs Immature Granulocytes 0.04 0.00 - 0.07 K/uL    Comment: Performed at [facility], [address] [place], [place] [address]  Comprehensive metabolic panel     Status: Abnormal   Collection Time: 12/16/23 11:32 AM  Result Value Ref Range   Sodium 134 (L) 135 - 145 mmol/L   Potassium 3.9 3.5 - 5.1 mmol/L   Chloride 90 (L) 98 - 111 mmol/L   CO2 30 22 - 32 mmol/L   Glucose, Bld 576 (HH) 70 - 99 mg/dL    Comment: CRITICAL RESULT CALLED TO, READ BACK BY AND VERIFIED WITH SOPHIE GOUCH, RN4/20/25 1219 MGRAMINSKI Glucose reference range applies only to samples taken after fasting for at least 8 hours.    BUN 27 (H) 8 - 23 mg/dL   Creatinine, Ser 8.63 (H) 0.61 - 1.24 mg/dL    Calcium  9.4 8.9 - 10.3 mg/dL   Total Protein 7.6 6.5 - 8.1 g/dL   Albumin 3.6 3.5 -  5.0 g/dL   AST 45 (H) 15 - 41 U/L   ALT 39 0 - 44 U/L   Alkaline Phosphatase 90 38 - 126 U/L   Total Bilirubin 1.0 0.0 - 1.2 mg/dL   GFR, Estimated 57 (L) >60 mL/min    Comment: (NOTE) Calculated using the CKD-EPI Creatinine Equation (2021)    Anion gap 14 5 - 15    Comment: Performed at [facility], [address] [place], [place] [address]  Urinalysis, Routine w reflex microscopic -Urine, Clean Catch     Status: Abnormal   Collection Time: 12/16/23 11:34 AM  Result Value Ref Range   Color, Urine YELLOW YELLOW   APPearance CLEAR CLEAR   Specific Gravity, Urine 1.010 1.005 - 1.030   pH 5.5 5.0 - 8.0   Glucose, UA >=500 (A) NEGATIVE mg/dL   Hgb urine dipstick TRACE (A) NEGATIVE   Bilirubin Urine NEGATIVE NEGATIVE   Ketones, ur NEGATIVE NEGATIVE mg/dL   Protein, ur NEGATIVE NEGATIVE mg/dL   Nitrite NEGATIVE NEGATIVE   Leukocytes,Ua NEGATIVE NEGATIVE    Comment: Performed at [facility], [address] [place], [place] [address]  Urinalysis, Microscopic (reflex)     Status: Abnormal   Collection Time: 12/16/23 11:34 AM  Result Value Ref Range   RBC / HPF 0-5 0 - 5 RBC/hpf   WBC, UA NONE SEEN 0 - 5 WBC/hpf   Bacteria, UA RARE (A) NONE SEEN   Squamous Epithelial / HPF NONE SEEN 0 - 5 /HPF    Comment: Performed at [facility], [address] [place], [place] [address]  CBG monitoring, ED     Status: Abnormal   Collection Time: 12/16/23  1:05 PM  Result Value Ref Range   Glucose-Capillary 575 (HH) 70 - 99 mg/dL    Comment: Glucose reference range applies only to samples taken after fasting for at least 8 hours.   Comment 1 Notify RN   CBG monitoring, ED     Status: Abnormal   Collection Time: 12/16/23  1:48 PM  Result Value Ref Range   Glucose-Capillary 574 (HH) 70 - 99 mg/dL    Comment: Glucose reference range applies only to samples taken after fasting  for at least 8 hours.   Comment 1 Notify RN   CBG monitoring, ED     Status: Abnormal   Collection Time: 12/16/23  2:35 PM  Result Value Ref Range   Glucose-Capillary 428 (H) 70 - 99 mg/dL    Comment: Glucose reference range applies only to samples taken after fasting for at least 8 hours.   Comment 1 Notify RN   POCT glucose (manual entry)     Status: Abnormal   Collection Time: 12/17/23  3:50 PM  Result Value Ref Range   POC Glucose 399 (A) 70 - 99 mg/dl  POCT glycosylated hemoglobin (Hb A1C)     Status: Abnormal   Collection Time: 12/17/23  4:09 PM  Result Value Ref Range   Hemoglobin A1C 12.0 (A) 4.0 - 5.6 %   HbA1c POC (<> result, manual entry)     HbA1c, POC (prediabetic range)     HbA1c, POC (controlled diabetic range)    CBC with Differential/Platelet     Status: Abnormal   Collection Time: 12/27/23 11:33 AM  Result Value Ref Range   WBC 9.2 3.4 - 10.8 x10E3/uL   RBC 4.71 4.14 - 5.80 x10E6/uL   Hemoglobin 14.9 13.0 - 17.7 g/dL  Hematocrit 43.2 37.5 - 51.0 %   MCV 92 79 - 97 fL   MCH 31.6 26.6 - 33.0 pg   MCHC 34.5 31.5 - 35.7 g/dL   RDW 86.7 88.3 - 84.5 %   Platelets 257 150 - 450 x10E3/uL   Neutrophils 65 Not Estab. %   Lymphs 17 Not Estab. %   Monocytes 11 Not Estab. %   Eos 6 Not Estab. %   Basos 1 Not Estab. %   Neutrophils Absolute 6.0 1.4 - 7.0 x10E3/uL   Lymphocytes Absolute 1.5 0.7 - 3.1 x10E3/uL   Monocytes Absolute 1.0 (H) 0.1 - 0.9 x10E3/uL   EOS (ABSOLUTE) 0.6 (H) 0.0 - 0.4 x10E3/uL   Basophils Absolute 0.1 0.0 - 0.2 x10E3/uL   Immature Granulocytes 0 Not Estab. %   Immature Grans (Abs) 0.0 0.0 - 0.1 x10E3/uL  Basic metabolic panel with GFR     Status: Abnormal   Collection Time: 12/27/23 11:33 AM  Result Value Ref Range   Glucose 264 (H) 70 - 99 mg/dL   BUN 30 (H) 8 - 27 mg/dL   Creatinine, Ser 8.42 (H) 0.76 - 1.27 mg/dL   eGFR 48 (L) >40 fO/fpw/8.26   BUN/Creatinine Ratio 19 10 - 24   Sodium 135 134 - 144 mmol/L   Potassium 3.0 (L) 3.5 - 5.2  mmol/L   Chloride 86 (L) 96 - 106 mmol/L   CO2 28 20 - 29 mmol/L   Calcium  9.9 8.6 - 10.2 mg/dL  Specimen status report     Status: None (Preliminary result)   Collection Time: 12/27/23 11:33 AM  Result Value Ref Range   specimen status report Comment     Comment: Ambiguous Test Order Ambiguous Test Order   Basic metabolic panel     Status: Abnormal   Collection Time: 01/10/24  2:00 PM  Result Value Ref Range   Glucose 147 (H) 70 - 99 mg/dL   BUN 20 8 - 27 mg/dL   Creatinine, Ser 8.54 (H) 0.76 - 1.27 mg/dL   eGFR 52 (L) >40 fO/fpw/8.26   BUN/Creatinine Ratio 14 10 - 24   Sodium 136 134 - 144 mmol/L   Potassium 3.1 (L) 3.5 - 5.2 mmol/L   Chloride 90 (L) 96 - 106 mmol/L   CO2 26 20 - 29 mmol/L   Calcium  10.0 8.6 - 10.2 mg/dL  Glucose, capillary     Status: Abnormal   Collection Time: 01/25/24 10:20 AM  Result Value Ref Range   Glucose-Capillary 127 (H) 70 - 99 mg/dL    Comment: Glucose reference range applies only to samples taken after fasting for at least 8 hours.  I-STAT, chem 8     Status: Abnormal   Collection Time: 01/25/24  1:12 PM  Result Value Ref Range   Sodium 143 135 - 145 mmol/L   Potassium 3.8 3.5 - 5.1 mmol/L   Chloride 104 98 - 111 mmol/L   BUN 18 8 - 23 mg/dL   Creatinine, Ser 8.79 0.61 - 1.24 mg/dL   Glucose, Bld 890 (H) 70 - 99 mg/dL    Comment: Glucose reference range applies only to samples taken after fasting for at least 8 hours.   Calcium , Ion 1.19 1.15 - 1.40 mmol/L   TCO2 26 22 - 32 mmol/L   Hemoglobin 11.6 (L) 13.0 - 17.0 g/dL   HCT 65.9 (L) 60.9 - 47.9 %  POCT Activated clotting time     Status: None   Collection Time: 01/25/24  2:56  PM  Result Value Ref Range   Activated Clotting Time 274 seconds    Comment: Reference range 74-137 seconds for patients not on anticoagulant therapy.  POCT Activated clotting time     Status: None   Collection Time: 01/25/24  3:15 PM  Result Value Ref Range   Activated Clotting Time 285 seconds    Comment:  Reference range 74-137 seconds for patients not on anticoagulant therapy.  POCT Activated clotting time     Status: None   Collection Time: 01/25/24  3:40 PM  Result Value Ref Range   Activated Clotting Time 348 seconds    Comment: Reference range 74-137 seconds for patients not on anticoagulant therapy.  Glucose, capillary     Status: Abnormal   Collection Time: 01/25/24  4:35 PM  Result Value Ref Range   Glucose-Capillary 147 (H) 70 - 99 mg/dL    Comment: Glucose reference range applies only to samples taken after fasting for at least 8 hours.  Basic Metabolic Panel (BMET)     Status: Abnormal   Collection Time: 02/22/24 12:06 PM  Result Value Ref Range   Glucose 164 (H) 70 - 99 mg/dL   BUN 24 8 - 27 mg/dL   Creatinine, Ser 8.70 (H) 0.76 - 1.27 mg/dL   eGFR 60 >40 fO/fpw/8.26   BUN/Creatinine Ratio 19 10 - 24   Sodium 140 134 - 144 mmol/L   Potassium 3.4 (L) 3.5 - 5.2 mmol/L   Chloride 96 96 - 106 mmol/L   CO2 23 20 - 29 mmol/L   Calcium  9.9 8.6 - 10.2 mg/dL  POCT glycosylated hemoglobin (Hb A1C)     Status: Abnormal   Collection Time: 03/04/24  1:50 PM  Result Value Ref Range   Hemoglobin A1C 6.5 (A) 4.0 - 5.6 %   HbA1c POC (<> result, manual entry)     HbA1c, POC (prediabetic range)     HbA1c, POC (controlled diabetic range)          Beverley Adine Hummer, MD, MS

## 2024-03-04 NOTE — Telephone Encounter (Signed)
 Pharmacy Patient Advocate Encounter   Received notification from CoverMyMeds that prior authorization for Ozempic  (1 MG/DOSE) 4MG /3ML pen-injectors  is required/requested.   Insurance verification completed.   The patient is insured through [facility] .   Per test claim: PA required; PA started via CoverMyMeds. KEY [id number] . Waiting for clinical questions to populate.

## 2024-03-04 NOTE — Patient Instructions (Signed)
  VISIT SUMMARY: Today, we reviewed your diabetes management, atrial fibrillation, and general health maintenance. Your diabetes control has significantly improved, and we discussed transitioning to a new medication for better heart protection and weight loss. We also addressed your atrial fibrillation and low potassium levels.  YOUR PLAN: -TYPE 2 DIABETES MELLITUS: Type 2 Diabetes Mellitus is a condition where your body does not use insulin  properly, leading to high blood sugar levels. Your A1c has improved from 12% to 6.5% with your current medications. We will transition you from Rybelsus  to Ozempic  injections, which may offer additional heart protection and weight loss benefits. You will discontinue glipizide  after finishing your current Rybelsus  tablets and start Ozempic  1 mg injection once weekly. Please monitor your weight and blood glucose levels and follow a low-carbohydrate diet.  -ATRIAL FIBRILLATION: Atrial fibrillation is an irregular and often rapid heart rate that can lead to poor blood flow. You have had recent episodes but are currently managed with Eliquis  and diltiazem . Continue these medications and follow up with the atrial fibrillation clinic. Gradually increase your physical activity as tolerated.  -HYPOKALEMIA: Hypokalemia is a condition where your blood potassium levels are low. Your current level is 3.4 mmol/L. We will repeat your metabolic panel and check your magnesium levels, as low magnesium can affect potassium absorption. We may adjust your potassium supplementation if needed.  -GENERAL HEALTH MAINTENANCE: You have received your flu and shingles vaccines but not the pneumonia vaccine, which is recommended due to your diabetes and heart disease. We will administer the pneumonia vaccine today and verify your shingles vaccination records from [employer].  INSTRUCTIONS: Please follow up with the atrial fibrillation clinic for ongoing management. We will repeat your  metabolic panel to check your potassium and magnesium levels. Continue monitoring your weight and blood glucose levels, and follow a low-carbohydrate diet. Gradually increase your physical activity as tolerated.

## 2024-03-05 ENCOUNTER — Other Ambulatory Visit (HOSPITAL_COMMUNITY): Payer: Self-pay

## 2024-03-05 ENCOUNTER — Ambulatory Visit: Payer: Self-pay | Admitting: Family Medicine

## 2024-03-05 LAB — HEPATITIS C ANTIBODY: Hepatitis C Ab: NONREACTIVE

## 2024-03-05 NOTE — Telephone Encounter (Signed)
 Pharmacy Patient Advocate Encounter   Received notification from CoverMyMeds that prior authorization for Ozempic  (1 MG/DOSE) 4MG /3ML pen-injectors  is due for renewal.   Insurance verification completed.   The patient is insured through [facility].  Action:   No PA needed at this time. This test claim was processed through [facility]- copay amounts may vary at other pharmacies due to pharmacy/plan contracts, or as the patient moves through the different stages of their insurance plan.

## 2024-03-10 ENCOUNTER — Telehealth: Payer: Self-pay

## 2024-03-10 NOTE — Progress Notes (Signed)
   03/10/2024  Patient ID: Oneil Clubs, male   DOB: [birth year], 68 y.o.   MRN: [id number]  Missed call/voicemail from patient stating Dr. Sebastian is changing him to Ozempic  1mg  weekly once he has completed the Rybelsus  7mg  tablets he has on hand.  He will also stop glipizide  at this time.  Patient calling with concern, because insurance is rejecting payment of Ozempic  1mg .  Upon reviewing PA team notes, a prior authorizaton is not required by the patient's plan.  I was able to submit a test claim, and it is rejecting off of recently Rybelsus  7mg  fill.  Contacted pharmacy to make them aware that Ozempic  1mg  will be replacing Rybelsus , so override codes can be submitted to insurance in order to get a paid claim.  Patient is aware and will go ahead and pick Ozempic  1mg  up when filled, but he will not start until completing Ryelsus 7mg  in another 3-4 weeks.  Follow-up visit scheduled to check on tolerance/efficacy of Ozempic  1mg  the first week of September.  Provided storage, administration, and risk/benefit information about medication to patient; he will call with any questions/concerns if needed before our follow-up visit.  [name], PharmD, DPLA

## 2024-03-20 ENCOUNTER — Other Ambulatory Visit

## 2024-03-28 DIAGNOSIS — E119 Type 2 diabetes mellitus without complications: Secondary | ICD-10-CM | POA: Diagnosis not present

## 2024-04-02 ENCOUNTER — Ambulatory Visit: Admitting: Skilled Nursing Facility1

## 2024-04-11 ENCOUNTER — Other Ambulatory Visit: Payer: Self-pay | Admitting: Family Medicine

## 2024-04-11 DIAGNOSIS — E1165 Type 2 diabetes mellitus with hyperglycemia: Secondary | ICD-10-CM

## 2024-04-16 ENCOUNTER — Telehealth: Payer: Self-pay

## 2024-04-16 ENCOUNTER — Encounter: Payer: Self-pay | Admitting: Family Medicine

## 2024-04-16 NOTE — Telephone Encounter (Signed)
 Noted

## 2024-04-16 NOTE — Progress Notes (Signed)
   04/16/2024  Patient ID: Richard Vazquez, male   DOB: April 10, 1956, 68 y.o.   MRN: [id number]  Returning missed call/voicemail from patient.  Richard Vazquez switched from Rybleus 7mg  daily to Ozempic  1mg  weekly 2 weeks ago (second injection was this past Saturday), and he is not tolerating Ozempic  well.  Patient is experiencing n/v, unable to eat due to feeling of fullness, and he has had increased incidences of a-fib according to his smart watch.    Patient sees Dr. Sebastian Friday morning to address this.  I recommend stopping Ozempic  and switching back to Rybelsus  7mg  daily.  Test claim reflects insurance will approve with overrides in place (I can assist pharmacy with claim if needed).  Patient was previously using glipizide  xl 2.5mg  daily, which was stopped when Ozempic  was started.  Based on recently A1c, it is unlikely that glipizide  needs to be added back on.  If needed, we can increase Rybelsus  to 14mg  daily in the future- patient tolerated 7mg  dose well with no adverse side effects.  Goal in switching to Ozmepic was for some weight loss benefit; could revisit Ozempic  again in the future if needed but would recommend starting with low dose and titrating up based on response to 1mg  dose.  Follow-up with patient already scheduled for 9/3, but I will verify pharmacy is able to fill Rybelsus  if switched back; so patient can restart Saturday (when next Ozempic  dose would have been due).  Patient goes out of town on vacation and will need medication Friday afternoon.  [name], PharmD, DPLA

## 2024-04-18 ENCOUNTER — Ambulatory Visit: Admitting: Family Medicine

## 2024-04-18 ENCOUNTER — Encounter: Payer: Self-pay | Admitting: Family Medicine

## 2024-04-18 ENCOUNTER — Ambulatory Visit: Payer: Medicare Other

## 2024-04-18 VITALS — BP 122/68 | HR 65 | Temp 97.8°F | Ht 75.0 in | Wt 273.0 lb

## 2024-04-18 DIAGNOSIS — E1165 Type 2 diabetes mellitus with hyperglycemia: Secondary | ICD-10-CM | POA: Diagnosis not present

## 2024-04-18 DIAGNOSIS — I4892 Unspecified atrial flutter: Secondary | ICD-10-CM | POA: Diagnosis not present

## 2024-04-18 DIAGNOSIS — J410 Simple chronic bronchitis: Secondary | ICD-10-CM | POA: Diagnosis not present

## 2024-04-18 DIAGNOSIS — Z0000 Encounter for general adult medical examination without abnormal findings: Secondary | ICD-10-CM

## 2024-04-18 DIAGNOSIS — T50905A Adverse effect of unspecified drugs, medicaments and biological substances, initial encounter: Secondary | ICD-10-CM

## 2024-04-18 DIAGNOSIS — R112 Nausea with vomiting, unspecified: Secondary | ICD-10-CM

## 2024-04-18 MED ORDER — RYBELSUS 7 MG PO TABS: 7.0000 mg | ORAL_TABLET | Freq: Every day | ORAL | 5 refills | Status: DC

## 2024-04-18 MED ORDER — ALBUTEROL SULFATE HFA 108 (90 BASE) MCG/ACT IN AERS: 2.0000 | INHALATION_SPRAY | Freq: Four times a day (QID) | RESPIRATORY_TRACT | 0 refills | Status: AC | PRN

## 2024-04-18 NOTE — Progress Notes (Signed)
 Assessment & Plan   Assessment/Plan:     Assessment & Plan Type 2 diabetes mellitus with intolerance to semaglutide  and right upper quadrant abdominal pain Type 2 diabetes mellitus previously managed with semaglutide  (Ozempic ) and metformin . Significant improvement in A1c from 12% to 6.5% over four months. Intolerance to semaglutide  due to nausea and right upper quadrant abdominal pain, possibly affecting gallbladder function. - Discontinue semaglutide  (Ozempic ) - Initiate Rybelsus  7 mg daily - Monitor blood glucose levels - Consider increasing Rybelsus  to 14 mg if hyperglycemia occurs - Order creatinine ratio  Atrial fibrillation, status post ablation Atrial fibrillation status post ablation in May. Recent increase in episodes possibly linked to semaglutide  use. Currently asymptomatic with heart rate in normal range (70-82 bpm). On Eliquis  and diltiazem  240 mg for rate control. - Continue Eliquis  - Continue diltiazem  240 mg - Monitor heart rate and symptoms with Apple Watch - Advise on medication timing: consider taking tamsulosin at night  Chronic obstructive pulmonary disease (COPD) COPD with exercise-induced shortness of breath, exacerbated by humid weather. No current use of inhaler. Previous use of albuterol  during flu episode was beneficial. - Prescribe albuterol  inhaler - Advise use of albuterol  1-2 puffs 15-30 minutes before exercise - Use albuterol  as needed for shortness of breath      Medications Discontinued During This Encounter  Medication Reason   Semaglutide , 1 MG/DOSE, 4 MG/3ML SOPN     Return in about 2 months (around 06/18/2024) for diabetes and copd.        Subjective:   Encounter date: 04/18/2024  Richard Vazquez is a 68 y.o. male who has Atrial flutter (HCC); Hyperlipemia; Hematuria; Carotid artery disease (HCC); Persistent atrial fibrillation (HCC); CAD (coronary artery disease); Shift work sleep disorder; OSA (obstructive sleep apnea); Class 2  severe obesity due to excess calories with serious comorbidity and body mass index (BMI) of 35.0 to 35.9 in adult [employer]); Renal stones; Prediabetes; Chronic constipation; Rash; Dyspnea on exertion; Bilateral edema of lower extremity; Vitamin D  deficiency; Psoriasis; Venous stasis of both lower extremities; Varicose veins of left lower extremity with pain; Absolute anemia; Benign prostatic hyperplasia with urinary frequency; Primary hypertension; Type 2 diabetes mellitus with hyperglycemia, without long-term current use of insulin  (HCC); Hypokalemia; and Simple chronic bronchitis (HCC) on their problem list..   He  has a past medical history of Acute pain of right knee (05/16/2022), Atrial fibrillation (HCC), Carotid artery disease (HCC), Colon polyps, COPD (chronic obstructive pulmonary disease) (HCC), Former smoker (12/21/2015), Hyperlipemia, Hypertension (Not sure), Kidney stones, Left carotid bruit (08/05/2021), Obesity, Paroxysmal atrial flutter (HCC) (05/29/2021), Sleep apnea (February 2023), and Sleep apnea treated with nocturnal bilevel positive airway pressure (BPAP).SABRA   He presents with chief complaint of Medical Management of Chronic Issues (Ozempic  causing stomach pain cramping and vomiting since Saturday. Not much appetite also conflict with Afib on apple watch.kidney follow in January.2026) .   Discussed the use of AI scribe software for clinical note transcription with the patient, who gave verbal consent to proceed.  History of Present Illness Richard Vazquez is a 68 year old male with diabetes who presents for follow-up on medication intolerance.  He has been experiencing significant nausea with semaglutide  (Ozempic ) injections, particularly at the 1 mg dose. His A1c was at goal at 6.5% approximately one and a half months ago, down from 12% four months ago. This regimen included semaglutide  1 mg once a week and metformin  1500 mg daily. He previously used glipizide  2.5 mg extended release, but  it was discontinued as his  A1c was at goal.  He experiences nausea and a 'stabbing pain' in the right upper quadrant, which he associates with the semaglutide  injections. He also noticed changes in his bowel movements, describing them as more yellow and fatty, which he attributes to bile changes.  He has a history of atrial fibrillation and underwent an ablation in May. He started experiencing more episodes of AFib after beginning Ozempic  on August 9th, with symptoms starting around August 12th. He occasionally feels the AFib but often does not notice any symptoms even when his heart rate is recorded as AFib. He is currently on Eliquis  and diltiazem  240 mg.  He experiences shortness of breath, particularly when walking in humid conditions, which he attributes to his COPD. He has used an albuterol  inhaler in the past during a flu episode, which helped alleviate his symptoms.  His current medications include metformin  1500 mg daily, diltiazem  240 mg, Eliquis , and he has previously used glipizide  and semaglutide .     ROS  Past Surgical History:  Procedure Laterality Date   A-FLUTTER ABLATION N/A 01/16/2022   Procedure: A-FLUTTER ABLATION;  Surgeon: Fernande Elspeth BROCKS, MD;  Location: [facility] INVASIVE CV LAB;  Service: Cardiovascular;  Laterality: N/A;   A-FLUTTER ABLATION N/A 01/25/2024   Procedure: A-FLUTTER ABLATION;  Surgeon: Nancey Eulas BRAVO, MD;  Location: [facility] INVASIVE CV LAB;  Service: Cardiovascular;  Laterality: N/A;   APPENDECTOMY  1971   ATRIAL FIBRILLATION ABLATION N/A 01/25/2024   Procedure: ATRIAL FIBRILLATION ABLATION;  Surgeon: Nancey Eulas BRAVO, MD;  Location: [facility] INVASIVE CV LAB;  Service: Cardiovascular;  Laterality: N/A;   CARDIOVERSION N/A 05/31/2021   Procedure: CARDIOVERSION;  Surgeon: Mona Vinie BROCKS, MD;  Location: [facility] ENDOSCOPY;  Service: Cardiovascular;  Laterality: N/A;   CARDIOVERSION N/A 10/05/2021   Procedure: CARDIOVERSION;  Surgeon: Mona Vinie BROCKS, MD;  Location: [facility]  ENDOSCOPY;  Service: Cardiovascular;  Laterality: N/A;   CARDIOVERSION N/A 11/01/2023   Procedure: CARDIOVERSION;  Surgeon: Santo Stanly LABOR, MD;  Location: [facility] INVASIVE CV LAB;  Service: Cardiovascular;  Laterality: N/A;   COLONOSCOPY     LEFT HEART CATH AND CORONARY ANGIOGRAPHY N/A 12/07/2021   Procedure: LEFT HEART CATH AND CORONARY ANGIOGRAPHY;  Surgeon: Wendel Lurena POUR, MD;  Location: [facility] INVASIVE CV LAB;  Service: Cardiovascular;  Laterality: N/A;   LITHOTRIPSY     multiple   TEE WITHOUT CARDIOVERSION N/A 05/31/2021   Procedure: TRANSESOPHAGEAL ECHOCARDIOGRAM (TEE);  Surgeon: Mona Vinie BROCKS, MD;  Location: [facility] ENDOSCOPY;  Service: Cardiovascular;  Laterality: N/A;    Outpatient Medications Prior to Visit  Medication Sig Dispense Refill   Blood Glucose Monitoring Suppl (CONTOUR BLOOD GLUCOSE SYSTEM) w/Device KIT Used to check blood sugar 1 kit 0   cholecalciferol (VITAMIN D3) 25 MCG (1000 UNIT) tablet Take 1,000 Units by mouth daily.     diltiazem  (CARDIZEM  CD) 240 MG 24 hr capsule TAKE 1 CAPSULE BY MOUTH DAILY 90 capsule 3   ELIQUIS  5 MG TABS tablet TAKE 1 TABLET BY MOUTH TWICE A DAY 180 tablet 3   glucose blood (CONTOUR NEXT TEST) test strip 1 each by Other route 4 (four) times daily - after meals and at bedtime. Use as instructed 360 each 3   indapamide  (LOZOL ) 2.5 MG tablet Take 2.5 mg by mouth daily.     Iron, Ferrous Sulfate , 325 (65 Fe) MG TABS Take 65 mg by mouth every other day.     Lancets Micro Thin 33G MISC Use to check blood sugar 400 each 3   metFORMIN  (  GLUCOPHAGE -XR) 750 MG 24 hr tablet Take 2 tablets (1,500 mg total) by mouth daily with breakfast. 180 tablet 0   Multiple Vitamin (MULTIVITAMIN) tablet Take 1 tablet by mouth daily.     nitroGLYCERIN  (NITROSTAT ) 0.4 MG SL tablet Place 1 tablet (0.4 mg total) under the tongue every 5 (five) minutes as needed for chest pain (Max 3 doses). 25 tablet 2   Omega-3 1000 MG CAPS Take 1,000 mg by mouth daily.     potassium  chloride SA (KLOR-CON  M) 20 MEQ tablet Take 1 tablet (20 mEq total) by mouth 2 (two) times daily. 180 tablet 2   rosuvastatin  (CRESTOR ) 20 MG tablet TAKE 1 TABLET BY MOUTH DAILY 90 tablet 3   tamsulosin (FLOMAX) 0.4 MG CAPS capsule Take 0.4 mg by mouth daily.     torsemide  (DEMADEX ) 20 MG tablet Take 1 tablet (20 mg total) by mouth daily. 90 tablet 3   triamcinolone  cream (KENALOG ) 0.1 % Apply 1 Application topically 2 (two) times daily. 30 g 0   Semaglutide , 1 MG/DOSE, 4 MG/3ML SOPN Inject 1 mg as directed once a week. 9 mL 0   bisacodyl  (DULCOLAX) 5 MG EC tablet Take 5 mg by mouth daily as needed for moderate constipation or severe constipation. (Patient taking differently: Take 5 mg by mouth as needed for moderate constipation or severe constipation.)     polyethylene glycol (MIRALAX  / GLYCOLAX ) 17 g packet Take 17 g by mouth 2 (two) times daily. (Patient taking differently: Take 17 g by mouth daily.) 180 packet 3   No facility-administered medications prior to visit.    Family History  Problem Relation Age of Onset   Diabetes Mother    Cancer Mother    Heart disease Mother    Hyperlipidemia Mother    Hypertension Mother    Miscarriages / [place] Mother    Stroke Mother    Varicose Veins Mother    Heart disease Father        smoker   Aortic stenosis Father    Hyperlipidemia Father    Hypertension Father    Ovarian cancer Sister    Colon cancer Paternal Uncle    Aneurysm Paternal Uncle        Brain   Cancer Paternal Uncle        all in abdomin, ? colon cancer   Asthma Sister    Cancer Paternal Uncle    Early death Paternal Aunt    Early death Paternal Uncle    Stomach cancer Neg Hx    Rectal cancer Neg Hx     Social History   Socioeconomic History   Marital status: Married    Spouse name: Not on file   Number of children: 2   Years of education: Not on file   Highest education level: 12th grade  Occupational History   Occupation: retired  Tobacco Use    Smoking status: Former    Current packs/day: 0.00    Average packs/day: 1.5 packs/day for 40.0 years (60.0 ttl pk-yrs)    Types: Cigarettes    Start date: 12/09/1979    Quit date: 12/09/2019    Years since quitting: 4.3    Passive exposure: Past   Smokeless tobacco: Never  Vaping Use   Vaping status: Never Used  Substance and Sexual Activity   Alcohol use: Not Currently   Drug use: Never   Sexual activity: Not Currently  Other Topics Concern   Not on file  Social History Narrative   Not  on file   Social Drivers of Health   Financial Resource Strain: Low Risk  (04/18/2024)   Overall Financial Resource Strain (CARDIA)    Difficulty of Paying Living Expenses: Not hard at all  Food Insecurity: No Food Insecurity (04/18/2024)   Hunger Vital Sign    Worried About Running Out of Food in the Last Year: Never true    Ran Out of Food in the Last Year: Never true  Transportation Needs: No Transportation Needs (04/18/2024)   PRAPARE - Administrator, Civil Service (Medical): No    Lack of Transportation (Non-Medical): No  Physical Activity: Insufficiently Active (04/18/2024)   Exercise Vital Sign    Days of Exercise per Week: 3 days    Minutes of Exercise per Session: 30 min  Stress: No Stress Concern Present (04/18/2024)   [employer] of Occupational Health - Occupational Stress Questionnaire    Feeling of Stress: Not at all  Social Connections: Unknown (04/18/2024)   Social Connection and Isolation Panel    Frequency of Communication with Friends and Family: Once a week    Frequency of Social Gatherings with Friends and Family: Not on file    Attends Religious Services: Never    Database administrator or Organizations: No    Attends Banker Meetings: Never    Marital Status: Married  Catering manager Violence: Not At Risk (04/18/2024)   Humiliation, Afraid, Rape, and Kick questionnaire    Fear of Current or Ex-Partner: No    Emotionally Abused: No     Physically Abused: No    Sexually Abused: No                                                                                                  Objective:  Physical Exam: BP 122/68 (BP Location: Left Arm, Patient Position: Sitting, Cuff Size: Normal)   Pulse 65   Temp 97.8 F (36.6 C) (Temporal)   Ht 6' 3 (1.905 m)   Wt 273 lb (123.8 kg)   SpO2 95%   BMI 34.12 kg/m    Physical Exam GENERAL: Alert, cooperative, well developed, no acute distress. HEENT: Normocephalic, normal oropharynx, moist mucous membranes. CHEST: Clear to auscultation bilaterally, no wheezes, rhonchi, or crackles. CARDIOVASCULAR: Normal heart rate and rhythm, S1 and S2 normal without murmurs. ABDOMEN: Soft, mild tenderness, non-distended, without organomegaly, normal bowel sounds. EXTREMITIES: No cyanosis or edema. NEUROLOGICAL: Cranial nerves grossly intact, moves all extremities without gross motor or sensory deficit.   Physical Exam  EP STUDY Result Date: 01/25/2024 SURGEON:  Eulas FORBES [place], MD PREPROCEDURE DIAGNOSES: 1. Atrial flutter 2. History of paroxysmal atrial flutter POSTPROCEDURE DIAGNOSES: 1. Paroxysmal atrial fibrillation. 2. Typical CTI-dependent atrial flutter 3. Atypical atrial flutter PROCEDURES: 1. Electrophysiologic study. 2. Coronary sinus pacing and recording. 3.  Induction, mapping, and ablation of typical atrial flutter 4.  Induction, mapping, and ablation of atypical biatrial flutter 5. Ablation of the pulmonary veins with pulsed field ablation 4. Ablation of the posterior wall of the left atrium with pulsed field ablation 5. Intracardiac echocardiography. 6. Transseptal puncture of an  intact septum. INTRODUCTION:  Oran Dillenburg is a 68 y.o. male with a history of paroxysmal atrial fibrillation who now presents for EP study and pulsed field energy ablation . DESCRIPTION OF PROCEDURE:  Informed written consent was obtained, and the patient was brought to the electrophysiology lab in a fasting  state.  The patient was adequately sedated with intravenous medications as outlined in the anesthesia report.  The patient's left and right groins were prepped and draped in the usual sterile fashion by the EP lab staff.  Using a percutaneous Seldinger technique, 1 9-French hemostasis sheaths was placed in the right femoral vein, and one 9-French and one 8-French  hemostasis sheath were placed into the left common femoral vein. Direct ultrasound guidance was used for right and left femoral veins with normal vessel patency. Using ultrasound guidance, the Brockenbrough needle and wire were visualized entering the vessel.  Initial Measurements: The patient presented to the electrophysiology lab in sinus rhythm. his PR interval measured 208 msec with a QRS duration of 119 msec and a QT interval of 392 msec.  Intracardiac Echocardiography: An 8-French Biosense Webster AcuNav intracardiac echocardiography catheter was introduced through the left common femoral vein and advanced into the right atrium. Intracardiac echocardiography was performed of the left atrium, and a three-dimensional anatomical rendering of the left atrium was performed using CARTO sound technology.  The interatrial septum was lipomatous and extremely thick, measuring approximately 3 cm on ICE.  The fossa was very small.  Due to the thickness of the septum, I could not visualize the left atrium with ice.  Catheter Placement:  A 7-French Biosense Webster Decapolar coronary sinus catheter was introduced through the right common femoral vein and advanced into the coronary sinus for recording and pacing from this location. Induction, mapping, and ablation of typical, CTI dependent flutter: Typical atrial flutter occurred spontaneously from premature beats that occurred during catheter placement.  The flutter cycle length was approximately 290 ms.  CS activation was from the proximal to distal poles.  We mapped the rhythm with the Octarray catheter; the  pattern of activation was consistent with a typical CTI dependent flutter.  The voltage map showed a small area of conduction in the middle of the cavotricuspid isthmus.  An 8 mm nonirrigated catheter was advanced to the right atrium.  Sound maps were obtained of the cavotricuspid isthmus.  We began to deliver lesions at the below the CTI in the area of obvious breakthrough.  The flutter terminated very easily.  Then, we continued to deliver lesions in this area, resulting in a CTI transit time of 150 ms. Induction, mapping of atypical flutter: We then performed additional burst pacing and induced a narrow complex tachycardia, a 2-1 flutter, with atrial burst pacing at approximately 2 and 30 ms.  The cycle length of the flutter was 240 ms.  Coronary sinus activation process from proximal to distal.  We performed activation mapping in the right atrium.  This appeared to show a pattern of activation earliest at the the base of the SVC conducting down the lateral wall and across the septum.  There is no discrete area of early activation.  There were areas of fractionated symptoms and double potentials on the septal aspect of the SVC/RA junction and along the upper portion of the septum.  I then decided to attempt a transseptal puncture and mapped the left atrium. Transseptal Puncture: Intravenous heparin  was administered to achieve a goal ACT of 300-350 seconds. The right common femoral vein sheath was  exchanged for Faradrive connect sheath and transseptal access was achieved in a standard fashion using a Bayless pigtail wire with intracardiac echocardiography.  This was very difficult due to the thickness of the patient's septum, poor ICE views, and small fossa. 3D Mapping and Ablation: A  Biosense Webster Octarray catheter was advanced into the left atrium through the Faradrive sheath. A 3 D electroanatomical map was obtained. This revealed a left common pulmonary vein and 2 discrete right pulmonary veins.  We  performed extensive activation mapping of the left atrium.  Earliest activation in the left atrium was on the septal portion of the mitral annulus, conducting up to the septum and dispersing across the remainder of the atrium.  We did not capture the entire cycle length of the rhythm in the left atrium, nor was there a discrete point focus. Watching both the right atrial and left atrial activation maps as a whole, there was a clear pattern of cyclical activation in the circuit from the superior vena cava down the septum of the atrium, crossing over to the left atrium in the area of the lower septum, then coursing back up the septal side of the left atrium and emerging again in the SVC.  I do not see a discrete obvious target for ablation.  Due to the thickness of the interatrial septum, I thought the likelihood of full-thickness ablation would be very low, though an attempt could be made with pulsed field energy.  I thought it would be reasonable to attempt ablation of the right superior pulmonary vein (as well as the other pulmonary veins due to a reported history of PAF) and an anterior mitral isthmus line, which if successful, would potentially interrupt the flutter circuit.   The mapping catheter was then removed and the Farapulse catheter advanced to the left atrium over its wire. Sequential electrical ablation of all four pulmonary veins was then performed using pulsed field energy.  Isolation of the right.  Pulmonary vein did not affect the tachycardia.  Because there was a fairly short space between pulmonary veins, I also decided to ablate the posterior wall with pulsed field energy.  I then attempted to ablate an anterior mitral line, but due to the thickness of the atrial septum and the position of the fossa, I was not able to retroflex the Faradrive sheath to position the ablation catheter on the septum.    A total of 52 pulses were delivered.  Cardioversion: The patient was then cardioverted to sinus  rhythm with a single synchronized 200-J biphasic shock with cardioversion electrodes in the anterior-posterior thoracic configuration. He remained in sinus rhythm thereafter. Prior to the conclusion of the case, we again tested the CTI.  At this point, CTI transit time was approximately 90 ms, indicating reconnection of conduction.  I then opted to use the Farapulse catheter to deliver additional ablation lesions.  The wire was delivered to the superior vena cava and the Farapulse placed in flower figuration and then retroflexed down to the CTI line.  ICE and electroanatomic mapping was used to confirm that we were on the lateral portion of the CTI, well away from the AV node.  A series of pulses were delivered between the tricuspid valve and the inferior vena cava.  After ablation, CTI transit time was approximately 140 ms. Measurements Following Ablation: In sinus rhythm with RR interval was 701 msec, with PR 191 msec, QRS 122 msec, and QT 406 msec.  Rapid atrial pacing was performed, which revealed an AV  Wenckebach cycle length of 440 msec.  The procedure was therefore considered completed.  All catheters were removed, and the sheaths were aspirated and flushed. The right femoral venous access was closed with a two perclose devices and the left femoral venous accesses with Mynx devices.  EBL<55ml.  Intracardiac echocardiogram revealed no pericardial effusion.  There were no early apparent complications. CONCLUSIONS: 1. Sinus rhythm upon presentation.  2.  Induction and successful ablation of CTI dependent atrial flutter 3.  Induction of an atypical flutter without targets for ablation 4.  DC cardioversion 5. successful ablation of all four pulmonary veins with pulsed field energy to address potential triggers for flutter and possible atrial fibrillation 6. Successful ablation of the posterior wall of the left atrium with pulsed field energy 7. No early apparent complications. Eulas BRAVO Mealor,MD 7:06 PM  01/25/2024     Recent Results (from the past 2160 hours)  Glucose, capillary     Status: Abnormal   Collection Time: 01/25/24 10:20 AM  Result Value Ref Range   Glucose-Capillary 127 (H) 70 - 99 mg/dL    Comment: Glucose reference range applies only to samples taken after fasting for at least 8 hours.  I-STAT, chem 8     Status: Abnormal   Collection Time: 01/25/24  1:12 PM  Result Value Ref Range   Sodium 143 135 - 145 mmol/L   Potassium 3.8 3.5 - 5.1 mmol/L   Chloride 104 98 - 111 mmol/L   BUN 18 8 - 23 mg/dL   Creatinine, Ser 8.79 0.61 - 1.24 mg/dL   Glucose, Bld 890 (H) 70 - 99 mg/dL    Comment: Glucose reference range applies only to samples taken after fasting for at least 8 hours.   Calcium , Ion 1.19 1.15 - 1.40 mmol/L   TCO2 26 22 - 32 mmol/L   Hemoglobin 11.6 (L) 13.0 - 17.0 g/dL   HCT 65.9 (L) 60.9 - 47.9 %  POCT Activated clotting time     Status: None   Collection Time: 01/25/24  2:56 PM  Result Value Ref Range   Activated Clotting Time 274 seconds    Comment: Reference range 74-137 seconds for patients not on anticoagulant therapy.  POCT Activated clotting time     Status: None   Collection Time: 01/25/24  3:15 PM  Result Value Ref Range   Activated Clotting Time 285 seconds    Comment: Reference range 74-137 seconds for patients not on anticoagulant therapy.  POCT Activated clotting time     Status: None   Collection Time: 01/25/24  3:40 PM  Result Value Ref Range   Activated Clotting Time 348 seconds    Comment: Reference range 74-137 seconds for patients not on anticoagulant therapy.  Glucose, capillary     Status: Abnormal   Collection Time: 01/25/24  4:35 PM  Result Value Ref Range   Glucose-Capillary 147 (H) 70 - 99 mg/dL    Comment: Glucose reference range applies only to samples taken after fasting for at least 8 hours.  Basic Metabolic Panel (BMET)     Status: Abnormal   Collection Time: 02/22/24 12:06 PM  Result Value Ref Range   Glucose 164 (H) 70  - 99 mg/dL   BUN 24 8 - 27 mg/dL   Creatinine, Ser 8.70 (H) 0.76 - 1.27 mg/dL   eGFR 60 >40 fO/fpw/8.26   BUN/Creatinine Ratio 19 10 - 24   Sodium 140 134 - 144 mmol/L   Potassium 3.4 (L) 3.5 - 5.2 mmol/L  Chloride 96 96 - 106 mmol/L   CO2 23 20 - 29 mmol/L   Calcium  9.9 8.6 - 10.2 mg/dL  POCT glycosylated hemoglobin (Hb A1C)     Status: Abnormal   Collection Time: 03/04/24  1:50 PM  Result Value Ref Range   Hemoglobin A1C 6.5 (A) 4.0 - 5.6 %   HbA1c POC (<> result, manual entry)     HbA1c, POC (prediabetic range)     HbA1c, POC (controlled diabetic range)    Basic Metabolic Panel (BMET)     Status: Abnormal   Collection Time: 03/04/24  2:42 PM  Result Value Ref Range   Sodium 139 135 - 145 mEq/L   Potassium 3.8 3.5 - 5.1 mEq/L   Chloride 96 96 - 112 mEq/L   CO2 30 19 - 32 mEq/L   Glucose, Bld 106 (H) 70 - 99 mg/dL   BUN 21 6 - 23 mg/dL   Creatinine, Ser 8.77 0.40 - 1.50 mg/dL   GFR 38.96 >39.99 mL/min    Comment: Calculated using the CKD-EPI Creatinine Equation (2021)   Calcium  9.8 8.4 - 10.5 mg/dL  Magnesium     Status: None   Collection Time: 03/04/24  2:42 PM  Result Value Ref Range   Magnesium 1.9 1.5 - 2.5 mg/dL  Hepatitis C Antibody     Status: None   Collection Time: 03/04/24  2:42 PM  Result Value Ref Range   Hepatitis C Ab NON-REACTIVE NON-REACTIVE    Comment: . HCV antibody was non-reactive. There is no laboratory  evidence of HCV infection. . In most cases, no further action is required. However, if recent HCV exposure is suspected, a test for HCV RNA (test code 64354) is suggested. . For additional information please refer to http://education.[facility].com/faq/FAQ22v1 (This link is being provided for informational/ educational purposes only.) .         Beverley Adine Hummer, MD, MS

## 2024-04-18 NOTE — Progress Notes (Signed)
 Subjective:   Richard Vazquez is a 68 y.o. who presents for a Medicare Wellness preventive visit.  As a reminder, Annual Wellness Visits don't include a physical exam, and some assessments may be limited, especially if this visit is performed virtually. We may recommend an in-person follow-up visit with your provider if needed.  Visit Complete: Virtual I connected with  Richard Vazquez on 04/18/24 by a audio enabled telemedicine application and verified that I am speaking with the correct person using two identifiers.  Patient Location: Home  Provider Location: Office/Clinic  I discussed the limitations of evaluation and management by telemedicine. The patient expressed understanding and agreed to proceed.  Vital Signs: Because this visit was a virtual/telehealth visit, some criteria may be missing or patient reported. Any vitals not documented were not able to be obtained and vitals that have been documented are patient reported.  VideoError- Librarian, academic were attempted between this provider and patient, however failed, due to patient having technical difficulties OR patient did not have access to video capability.  We continued and completed visit with audio only.   Persons Participating in Visit: Patient.  AWV Questionnaire: Yes: Patient Medicare AWV questionnaire was completed by the patient on 04/14/2024; I have confirmed that all information answered by patient is correct and no changes since this date.  Cardiac Risk Factors include: advanced age (>16men, >32 women);diabetes mellitus;dyslipidemia;hypertension;male gender     Objective:    Today's Vitals   There is no height or weight on file to calculate BMI.     04/18/2024    8:14 AM 01/25/2024   11:05 AM 12/16/2023   11:24 AM 11/01/2023    7:03 AM 04/16/2023    8:16 AM 03/19/2023    5:18 PM 04/11/2022    2:17 PM  Advanced Directives  Does Patient Have a Medical Advance Directive? Yes Yes No Yes Yes  Yes No  Type of Estate agent of [place];Living will Living will;Healthcare Power of [employer] Power of [place];Living will Healthcare Power of [place];Living will    Copy of Healthcare Power of Attorney in Chart? No - copy requested No - copy requested   No - copy requested    Would patient like information on creating a medical advance directive?       No - Patient declined    Current Medications (verified) Outpatient Encounter Medications as of 04/18/2024  Medication Sig   bisacodyl  (DULCOLAX) 5 MG EC tablet Take 5 mg by mouth daily as needed for moderate constipation or severe constipation. (Patient taking differently: Take 5 mg by mouth as needed for moderate constipation or severe constipation.)   Blood Glucose Monitoring Suppl (CONTOUR BLOOD GLUCOSE SYSTEM) w/Device KIT Used to check blood sugar   cholecalciferol (VITAMIN D3) 25 MCG (1000 UNIT) tablet Take 1,000 Units by mouth daily.   diltiazem  (CARDIZEM  CD) 240 MG 24 hr capsule TAKE 1 CAPSULE BY MOUTH DAILY   ELIQUIS  5 MG TABS tablet TAKE 1 TABLET BY MOUTH TWICE A DAY   glucose blood (CONTOUR NEXT TEST) test strip 1 each by Other route 4 (four) times daily - after meals and at bedtime. Use as instructed   indapamide  (LOZOL ) 2.5 MG tablet Take 2.5 mg by mouth daily.   Iron, Ferrous Sulfate , 325 (65 Fe) MG TABS Take 65 mg by mouth every other day.   Lancets Micro Thin 33G MISC Use to check blood sugar   metFORMIN  (GLUCOPHAGE -XR) 750 MG 24 hr tablet Take 2 tablets (  1,500 mg total) by mouth daily with breakfast. (Patient taking differently: Take 750 mg by mouth 2 (two) times daily.)   Multiple Vitamin (MULTIVITAMIN) tablet Take 1 tablet by mouth daily.   nitroGLYCERIN  (NITROSTAT ) 0.4 MG SL tablet Place 1 tablet (0.4 mg total) under the tongue every 5 (five) minutes as needed for chest pain (Max 3 doses).   Omega-3 1000 MG CAPS Take 1,000 mg by mouth daily.   polyethylene glycol (MIRALAX  / GLYCOLAX ) 17 g  packet Take 17 g by mouth 2 (two) times daily. (Patient taking differently: Take 17 g by mouth daily.)   potassium chloride  SA (KLOR-CON  M) 20 MEQ tablet Take 1 tablet (20 mEq total) by mouth 2 (two) times daily.   rosuvastatin  (CRESTOR ) 20 MG tablet TAKE 1 TABLET BY MOUTH DAILY   Semaglutide , 1 MG/DOSE, 4 MG/3ML SOPN Inject 1 mg as directed once a week.   tamsulosin (FLOMAX) 0.4 MG CAPS capsule Take 0.4 mg by mouth daily.   torsemide  (DEMADEX ) 20 MG tablet Take 1 tablet (20 mg total) by mouth daily.   triamcinolone  cream (KENALOG ) 0.1 % Apply 1 Application topically 2 (two) times daily.   No facility-administered encounter medications on file as of 04/18/2024.    Allergies (verified) Codeine, Latex, and Lipitor [atorvastatin]   History: Past Medical History:  Diagnosis Date   Acute pain of right knee 05/16/2022   Atrial fibrillation (HCC)    Carotid artery disease (HCC)    Carotid US  09/01/2022: R 40-59, L 1-39   Colon polyps    COPD (chronic obstructive pulmonary disease) (HCC)    Former smoker 12/21/2015   Hyperlipemia    Hypertension Not sure   May have started when Atrial flutter began   Kidney stones    Left carotid bruit 08/05/2021   Obesity    Paroxysmal atrial flutter (HCC) 05/29/2021   Sleep apnea February 2023   bipap   Sleep apnea treated with nocturnal bilevel positive airway pressure (BPAP)    Past Surgical History:  Procedure Laterality Date   A-FLUTTER ABLATION N/A 01/16/2022   Procedure: A-FLUTTER ABLATION;  Surgeon: Fernande Elspeth BROCKS, MD;  Location: [facility] INVASIVE CV LAB;  Service: Cardiovascular;  Laterality: N/A;   A-FLUTTER ABLATION N/A 01/25/2024   Procedure: A-FLUTTER ABLATION;  Surgeon: Nancey Eulas BRAVO, MD;  Location: [facility] INVASIVE CV LAB;  Service: Cardiovascular;  Laterality: N/A;   APPENDECTOMY  1971   ATRIAL FIBRILLATION ABLATION N/A 01/25/2024   Procedure: ATRIAL FIBRILLATION ABLATION;  Surgeon: Nancey Eulas BRAVO, MD;  Location: [facility] INVASIVE CV LAB;   Service: Cardiovascular;  Laterality: N/A;   CARDIOVERSION N/A 05/31/2021   Procedure: CARDIOVERSION;  Surgeon: Mona Vinie BROCKS, MD;  Location: [facility] ENDOSCOPY;  Service: Cardiovascular;  Laterality: N/A;   CARDIOVERSION N/A 10/05/2021   Procedure: CARDIOVERSION;  Surgeon: Mona Vinie BROCKS, MD;  Location: [facility] ENDOSCOPY;  Service: Cardiovascular;  Laterality: N/A;   CARDIOVERSION N/A 11/01/2023   Procedure: CARDIOVERSION;  Surgeon: Santo Stanly LABOR, MD;  Location: [facility] INVASIVE CV LAB;  Service: Cardiovascular;  Laterality: N/A;   COLONOSCOPY     LEFT HEART CATH AND CORONARY ANGIOGRAPHY N/A 12/07/2021   Procedure: LEFT HEART CATH AND CORONARY ANGIOGRAPHY;  Surgeon: Wendel Lurena POUR, MD;  Location: [facility] INVASIVE CV LAB;  Service: Cardiovascular;  Laterality: N/A;   LITHOTRIPSY     multiple   TEE WITHOUT CARDIOVERSION N/A 05/31/2021   Procedure: TRANSESOPHAGEAL ECHOCARDIOGRAM (TEE);  Surgeon: Mona Vinie BROCKS, MD;  Location: [facility] ENDOSCOPY;  Service: Cardiovascular;  Laterality: N/A;  Family History  Problem Relation Age of Onset   Diabetes Mother    Cancer Mother    Heart disease Mother    Hyperlipidemia Mother    Hypertension Mother    Miscarriages / [place] Mother    Stroke Mother    Varicose Veins Mother    Heart disease Father        smoker   Aortic stenosis Father    Hyperlipidemia Father    Hypertension Father    Ovarian cancer Sister    Colon cancer Paternal Uncle    Aneurysm Paternal Uncle        Brain   Cancer Paternal Uncle        all in abdomin, ? colon cancer   Asthma Sister    Cancer Paternal Uncle    Early death Paternal Aunt    Early death Paternal Uncle    Stomach cancer Neg Hx    Rectal cancer Neg Hx    Social History   Socioeconomic History   Marital status: Married    Spouse name: Not on file   Number of children: 2   Years of education: Not on file   Highest education level: 12th grade  Occupational History   Occupation: retired  Tobacco Use    Smoking status: Former    Current packs/day: 0.00    Average packs/day: 1.5 packs/day for 40.0 years (60.0 ttl pk-yrs)    Types: Cigarettes    Start date: 12/09/1979    Quit date: 12/09/2019    Years since quitting: 4.3    Passive exposure: Past   Smokeless tobacco: Never  Vaping Use   Vaping status: Never Used  Substance and Sexual Activity   Alcohol use: Not Currently   Drug use: Never   Sexual activity: Not Currently  Other Topics Concern   Not on file  Social History Narrative   Not on file   Social Drivers of Health   Financial Resource Strain: Low Risk  (04/18/2024)   Overall Financial Resource Strain (CARDIA)    Difficulty of Paying Living Expenses: Not hard at all  Food Insecurity: No Food Insecurity (04/18/2024)   Hunger Vital Sign    Worried About Running Out of Food in the Last Year: Never true    Ran Out of Food in the Last Year: Never true  Transportation Needs: No Transportation Needs (04/18/2024)   PRAPARE - Administrator, Civil Service (Medical): No    Lack of Transportation (Non-Medical): No  Physical Activity: Insufficiently Active (04/18/2024)   Exercise Vital Sign    Days of Exercise per Week: 3 days    Minutes of Exercise per Session: 30 min  Stress: No Stress Concern Present (04/18/2024)   [employer] of Occupational Health - Occupational Stress Questionnaire    Feeling of Stress: Not at all  Social Connections: Unknown (04/18/2024)   Social Connection and Isolation Panel    Frequency of Communication with Friends and Family: Once a week    Frequency of Social Gatherings with Friends and Family: Not on file    Attends Religious Services: Never    Database administrator or Organizations: No    Attends Engineer, structural: Never    Marital Status: Married    Tobacco Counseling Counseling given: Not Answered    Clinical Intake:  Pre-visit preparation completed: Yes  Pain : No/denies pain     Nutritional Risks:  Nausea/ vomitting/ diarrhea Diabetes: Yes CBG done?: No Did pt. bring in CBG  monitor from home?: No  Lab Results  Component Value Date   HGBA1C 6.5 (A) 03/04/2024   HGBA1C 12.0 (A) 12/17/2023   HGBA1C 6.4 06/19/2023     How often do you need to have someone help you when you read instructions, pamphlets, or other written materials from your doctor or pharmacy?: 1 - Never  Interpreter Needed?: No  Information entered by :: NAllen LPN   Activities of Daily Living     04/14/2024   12:03 PM 11/01/2023    6:57 AM  In your present state of health, do you have any difficulty performing the following activities:  Hearing? 0 0  Vision? 0 0  Difficulty concentrating or making decisions? 0 0  Walking or climbing stairs? 0   Dressing or bathing? 0   Doing errands, shopping? 0   Preparing Food and eating ? N   Using the Toilet? N   In the past six months, have you accidently leaked urine? N   Do you have problems with loss of bowel control? N   Managing your Medications? N   Managing your Finances? N   Housekeeping or managing your Housekeeping? N     Patient Care Team: Sebastian Beverley NOVAK, MD as PCP - General (Family Medicine) Okey Vina GAILS, MD as PCP - Cardiology (Cardiology) Mealor, Eulas BRAVO, MD as PCP - Electrophysiology (Cardiology) Lelon Glendia ONEIDA DEVONNA as Physician Assistant (Cardiology)  I have updated your Care Teams any recent Medical Services you may have received from other providers in the past year.     Assessment:   This is a routine wellness examination for Richard Vazquez.  Hearing/Vision screen Hearing Screening - Comments:: Denies hearing issues Vision Screening - Comments:: Regular eye exams, [facility]   Goals Addressed             This Visit's Progress    Patient Stated       04/18/2024, wants to lose weight and keep diabetes in check       Depression Screen     04/18/2024    8:15 AM 12/31/2023    2:09 PM 06/19/2023    9:49 AM 04/16/2023    8:18 AM  03/28/2023    2:13 PM 12/18/2022    1:46 PM 04/11/2022    2:18 PM  PHQ 2/9 Scores  PHQ - 2 Score 0 0 0 0 0 0 0  PHQ- 9 Score 2  4 1  3      Fall Risk     04/14/2024   12:03 PM 04/14/2023    3:30 AM 03/28/2023    2:09 PM 12/18/2022    1:09 PM 05/16/2022    3:14 PM  Fall Risk   Falls in the past year? 0 0 1 0 1  Number falls in past yr: 0 0 0 0 0  Injury with Fall? 0 0 1 0 1  Risk for fall due to : Medication side effect Medication side effect No Fall Risks    Follow up Falls prevention discussed;Falls evaluation completed Falls prevention discussed;Falls evaluation completed Falls prevention discussed      MEDICARE RISK AT HOME:  Medicare Risk at Home Any stairs in or around the home?: (Patient-Rptd) Yes If so, are there any without handrails?: (Patient-Rptd) No Home free of loose throw rugs in walkways, pet beds, electrical cords, etc?: (Patient-Rptd) Yes Adequate lighting in your home to reduce risk of falls?: (Patient-Rptd) Yes Life alert?: (Patient-Rptd) No Use of a cane, walker or w/c?: (Patient-Rptd) No Grab  bars in the bathroom?: (Patient-Rptd) No Shower chair or bench in shower?: (Patient-Rptd) No Elevated toilet seat or a handicapped toilet?: (Patient-Rptd) No  TIMED UP AND GO:  Was the test performed?  No  Cognitive Function: 6CIT completed        04/18/2024    8:15 AM 04/16/2023    8:19 AM 04/11/2022    2:25 PM  6CIT Screen  What Year? 0 points 0 points 0 points  What month? 0 points 0 points 0 points  What time? 0 points 0 points 0 points  Count back from 20 0 points 0 points 0 points  Months in reverse 0 points 0 points 0 points  Repeat phrase 0 points 0 points 0 points  Total Score 0 points 0 points 0 points    Immunizations Immunization History  Administered Date(s) Administered   Fluad Quad(high Dose 65+) 05/16/2022   PNEUMOCOCCAL CONJUGATE-20 03/04/2024   Tdap 02/07/2020   Zoster Recombinant(Shingrix) 06/18/2023, 10/23/2023, 10/24/2023     Screening Tests Health Maintenance  Topic Date Due   COVID-19 Vaccine (1) Never done   Diabetic kidney evaluation - Urine ACR  Never done   INFLUENZA VACCINE  03/28/2024   OPHTHALMOLOGY EXAM  07/01/2024   HEMOGLOBIN A1C  09/04/2024   Lung Cancer Screening  12/03/2024   FOOT EXAM  12/30/2024   Diabetic kidney evaluation - eGFR measurement  03/04/2025   Medicare Annual Wellness (AWV)  04/18/2025   Colonoscopy  08/26/2026   DTaP/Tdap/Td (2 - Td or Tdap) 02/06/2030   Pneumococcal Vaccine: 50+ Years  Completed   Hepatitis C Screening  Completed   Zoster Vaccines- Shingrix  Completed   HPV VACCINES  Aged Out   Meningococcal B Vaccine  Aged Out    Health Maintenance  Health Maintenance Due  Topic Date Due   COVID-19 Vaccine (1) Never done   Diabetic kidney evaluation - Urine ACR  Never done   INFLUENZA VACCINE  03/28/2024   Health Maintenance Items Addressed: Declines covid vaccine. Due for flu vaccine.  Additional Screening:  Vision Screening: Recommended annual ophthalmology exams for early detection of glaucoma and other disorders of the eye. Would you like a referral to an eye doctor? No    Dental Screening: Recommended annual dental exams for proper oral hygiene  Community Resource Referral / Chronic Care Management: CRR required this visit?  No   CCM required this visit?  No   Plan:    I have personally reviewed and noted the following in the patient's chart:   Medical and social history Use of alcohol, tobacco or illicit drugs  Current medications and supplements including opioid prescriptions. Patient is not currently taking opioid prescriptions. Functional ability and status Nutritional status Physical activity Advanced directives List of other physicians Hospitalizations, surgeries, and ER visits in previous 12 months Vitals Screenings to include cognitive, depression, and falls Referrals and appointments  In addition, I have reviewed and  discussed with patient certain preventive protocols, quality metrics, and best practice recommendations. A written personalized care plan for preventive services as well as general preventive health recommendations were provided to patient.   Ardella FORBES Dawn, LPN   1/[id number]/[id number]   After Visit Summary: (MyChart) Due to this being a telephonic visit, the after visit summary with patients personalized plan was offered to patient via MyChart   Notes: Nothing significant to report at this time.

## 2024-04-18 NOTE — Patient Instructions (Signed)
  VISIT SUMMARY: Today, we discussed your recent experiences with medication intolerance, particularly with semaglutide  (Ozempic ), and its impact on your diabetes management and overall health. We also reviewed your atrial fibrillation status post-ablation and your COPD symptoms.  YOUR PLAN: -TYPE 2 DIABETES MELLITUS WITH INTOLERANCE TO SEMAGLUTIDE  AND RIGHT UPPER QUADRANT ABDOMINAL PAIN: You have type 2 diabetes, which means your body has difficulty regulating blood sugar levels. We have decided to discontinue semaglutide  (Ozempic ) due to the nausea and abdominal pain it caused. Instead, we will start you on Rybelsus  7 mg daily. Please monitor your blood glucose levels regularly, and we may increase the dose to 14 mg if your blood sugar levels become too high. We will also check your creatinine ratio to monitor your kidney function.  -ATRIAL FIBRILLATION, STATUS POST ABLATION: Atrial fibrillation is an irregular and often rapid heart rate. Since your ablation in May, you have had more episodes, possibly linked to semaglutide  use. You are currently asymptomatic with a normal heart rate. Continue taking Eliquis  and diltiazem  240 mg as prescribed. Use your Apple Watch to monitor your heart rate and symptoms. Consider taking tamsulosin at night to help manage your condition.  -CHRONIC OBSTRUCTIVE PULMONARY DISEASE (COPD): COPD is a chronic lung condition that makes it hard to breathe. You experience shortness of breath, especially when walking in humid conditions. We will prescribe an albuterol  inhaler for you to use. Take 1-2 puffs 15-30 minutes before exercise and use it as needed for shortness of breath.  INSTRUCTIONS: Please follow up with us  if you experience any new or worsening symptoms. Continue to monitor your blood glucose levels and heart rate as discussed. We will check your creatinine ratio to ensure your kidneys are functioning well. If you have any questions or concerns, do not hesitate to  contact our office.

## 2024-04-18 NOTE — Patient Instructions (Signed)
 Mr. Hanners , Thank you for taking time out of your busy schedule to complete your Annual Wellness Visit with me. I enjoyed our conversation and look forward to speaking with you again next year. I, as well as your care team,  appreciate your ongoing commitment to your health goals. Please review the following plan we discussed and let me know if I can assist you in the future. Your Game plan/ To Do List    Referrals: If you haven't heard from the office you've been referred to, please reach out to them at the phone provided.   Follow up Visits: We will see or speak with you next year for your Next Medicare AWV with our clinical staff Have you seen your provider in the last 6 months (3 months if uncontrolled diabetes)? Yes  Clinician Recommendations:  Aim for 30 minutes of exercise or brisk walking, 6-8 glasses of water, and 5 servings of fruits and vegetables each day.       This is a list of the screenings recommended for you:  Health Maintenance  Topic Date Due   COVID-19 Vaccine (1) Never done   Yearly kidney health urinalysis for diabetes  Never done   Flu Shot  03/28/2024   Eye exam for diabetics  07/01/2024   Hemoglobin A1C  09/04/2024   Screening for Lung Cancer  12/03/2024   Complete foot exam   12/30/2024   Yearly kidney function blood test for diabetes  03/04/2025   Medicare Annual Wellness Visit  04/18/2025   Colon Cancer Screening  08/26/2026   DTaP/Tdap/Td vaccine (2 - Td or Tdap) 02/06/2030   Pneumococcal Vaccine for age over 83  Completed   Hepatitis C Screening  Completed   Zoster (Shingles) Vaccine  Completed   HPV Vaccine  Aged Out   Meningitis B Vaccine  Aged Out    Advanced directives: (Copy Requested) Please bring a copy of your health care power of attorney and living will to the office to be added to your chart at your convenience. You can mail to [facility] [address] [place], [place] [address] or email to  [email or web address] Advance Care Planning is important because it:  [x]  Makes sure you receive the medical care that is consistent with your values, goals, and preferences  [x]  It provides guidance to your family and loved ones and reduces their decisional burden about whether or not they are making the right decisions based on your wishes.  Follow the link provided in your after visit summary or read over the paperwork we have mailed to you to help you started getting your Advance Directives in place. If you need assistance in completing these, please reach out to us  so that we can help you!  See attachments for Preventive Care and Fall Prevention Tips.

## 2024-04-25 ENCOUNTER — Other Ambulatory Visit: Payer: Self-pay | Admitting: Family Medicine

## 2024-04-25 ENCOUNTER — Ambulatory Visit: Admitting: Pulmonary Disease

## 2024-04-25 DIAGNOSIS — E1165 Type 2 diabetes mellitus with hyperglycemia: Secondary | ICD-10-CM

## 2024-04-28 DIAGNOSIS — E119 Type 2 diabetes mellitus without complications: Secondary | ICD-10-CM | POA: Diagnosis not present

## 2024-04-28 NOTE — Progress Notes (Unsigned)
 Cardiology Office Note Date:  04/28/2024  Patient ID:  Richard Vazquez, DOB 10-12-1955, MRN [id number] PCP:  Sebastian Beverley NOVAK, MD  Cardiologist:  Dr Okey Electrophysiologist: Dr. Fernande >> Dr. Nancey    Chief Complaint:  *** post ablation visit   History of Present Illness: Richard Vazquez is a 68 y.o. 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 It was elected to ablate his cavotricuspid isthmus  unfortunately, within a couple of weeks he had recurrence of a flutter. Flecainide  was initiated   Seeing Dr. Fernande and myself over the years  At my last visit with him, Nov 2024 > not ready to consider AAD, [place] Saw Dr. Fernande 10/30/23, worsening edema, DOE, planned to refer to Dr. Nancey for consideration of ablation and started on Multaq   DCCV successful  11/01/23  Saw Dr. Nancey 11/20/23, discusses coarse Afib as well, pt was actively working on weight loss Planned for ablation  01/25/24 CTI ablation PVIs and posterior wall ablated Induced an atypical flutter without targets for ablation  Saw the AFib clinic 02/22/24, better energy, no procedural concerns  TODAY  *** symptoms *** eliquis , dose, bleeding,labs *** volume *** LE edema?  VVS still?   AF/AAD hx AFlutter May 2023 Ablation 01/16/22 May 2023 Flecainide  post Ablation with atypical AFlutter  Flecainide  stopped June 2024 with development of RBBB and increased intervals Multaq  started 10/30/23 >> stopped  01/25/24 CTI ablation PVIs and posterior wall ablated Induced an atypical flutter without targets for ablation  Past Medical History:  Diagnosis Date   Acute pain of right knee 05/16/2022   Atrial fibrillation (HCC)    Carotid artery  disease (HCC)    Carotid US  09/01/2022: R 40-59, L 1-39   Colon polyps    COPD (chronic obstructive pulmonary disease) (HCC)    Former smoker 12/21/2015   Hyperlipemia    Hypertension Not sure   May have started when Atrial flutter began   Kidney stones    Left carotid bruit 08/05/2021   Obesity    Paroxysmal atrial flutter (HCC) 05/29/2021   Sleep apnea February 2023   bipap   Sleep apnea treated with nocturnal bilevel positive airway pressure (BPAP)     Past Surgical History:  Procedure Laterality Date   A-FLUTTER ABLATION N/A 01/16/2022   Procedure: A-FLUTTER ABLATION;  Surgeon: Fernande Elspeth BROCKS, MD;  Location[facility] INVASIVE CV LAB;  Service: Cardiovascular;  Laterality: N/A;   A-FLUTTER ABLATION N/A 01/25/2024   Procedure: A-FLUTTER ABLATION;  Surgeon: Nancey Eulas BRAVO, MD;  Location: [facility] INVASIVE CV LAB;  Service: Cardiovascular;  Laterality: N/A;   APPENDECTOMY  1971   ATRIAL FIBRILLATION ABLATION N/A 01/25/2024   Procedure: ATRIAL FIBRILLATION ABLATION;  Surgeon: Nancey Eulas BRAVO, MD;  Location: [facility] INVASIVE CV LAB;  Service: Cardiovascular;  Laterality: N/A;   CARDIOVERSION N/A 05/31/2021   Procedure: CARDIOVERSION;  Surgeon: Mona Vinie BROCKS, MD;  Location: [facility] ENDOSCOPY;  Service: Cardiovascular;  Laterality: N/A;   CARDIOVERSION N/A 10/05/2021   Procedure: CARDIOVERSION;  Surgeon: Mona Vinie BROCKS, MD;  Location: [facility] ENDOSCOPY;  Service: Cardiovascular;  Laterality: N/A;   CARDIOVERSION N/A 11/01/2023   Procedure: CARDIOVERSION;  Surgeon: Santo Stanly LABOR, MD;  Location: [facility] INVASIVE CV LAB;  Service: Cardiovascular;  Laterality: N/A;   COLONOSCOPY  LEFT HEART CATH AND CORONARY ANGIOGRAPHY N/A 12/07/2021   Procedure: LEFT HEART CATH AND CORONARY ANGIOGRAPHY;  Surgeon: Wendel Lurena POUR, MD;  Location: [facility] INVASIVE CV LAB;  Service: Cardiovascular;  Laterality: N/A;   LITHOTRIPSY     multiple   TEE WITHOUT CARDIOVERSION N/A 05/31/2021   Procedure: TRANSESOPHAGEAL  ECHOCARDIOGRAM (TEE);  Surgeon: Mona Vinie BROCKS, MD;  Location: [facility] ENDOSCOPY;  Service: Cardiovascular;  Laterality: N/A;    Current Outpatient Medications  Medication Sig Dispense Refill   albuterol  (VENTOLIN  HFA) 108 (90 Base) MCG/ACT inhaler Inhale 2 puffs into the lungs every 6 (six) hours as needed for wheezing or shortness of breath. 8 g 0   bisacodyl  (DULCOLAX) 5 MG EC tablet Take 5 mg by mouth daily as needed for moderate constipation or severe constipation. (Patient taking differently: Take 5 mg by mouth as needed for moderate constipation or severe constipation.)     Blood Glucose Monitoring Suppl (CONTOUR BLOOD GLUCOSE SYSTEM) w/Device KIT Used to check blood sugar 1 kit 0   cholecalciferol (VITAMIN D3) 25 MCG (1000 UNIT) tablet Take 1,000 Units by mouth daily.     diltiazem  (CARDIZEM  CD) 240 MG 24 hr capsule TAKE 1 CAPSULE BY MOUTH DAILY 90 capsule 3   ELIQUIS  5 MG TABS tablet TAKE 1 TABLET BY MOUTH TWICE A DAY 180 tablet 3   glucose blood (CONTOUR NEXT TEST) test strip 1 each by Other route 4 (four) times daily - after meals and at bedtime. Use as instructed 360 each 3   indapamide  (LOZOL ) 2.5 MG tablet Take 2.5 mg by mouth daily.     Iron, Ferrous Sulfate , 325 (65 Fe) MG TABS Take 65 mg by mouth every other day.     Lancets Micro Thin 33G MISC Use to check blood sugar 400 each 3   metFORMIN  (GLUCOPHAGE -XR) 750 MG 24 hr tablet Take 2 tablets (1,500 mg total) by mouth daily with breakfast. 180 tablet 0   Multiple Vitamin (MULTIVITAMIN) tablet Take 1 tablet by mouth daily.     nitroGLYCERIN  (NITROSTAT ) 0.4 MG SL tablet Place 1 tablet (0.4 mg total) under the tongue every 5 (five) minutes as needed for chest pain (Max 3 doses). 25 tablet 2   Omega-3 1000 MG CAPS Take 1,000 mg by mouth daily.     polyethylene glycol (MIRALAX  / GLYCOLAX ) 17 g packet Take 17 g by mouth 2 (two) times daily. (Patient taking differently: Take 17 g by mouth daily.) 180 packet 3   potassium chloride  SA  (KLOR-CON  M) 20 MEQ tablet Take 1 tablet (20 mEq total) by mouth 2 (two) times daily. 180 tablet 2   rosuvastatin  (CRESTOR ) 20 MG tablet TAKE 1 TABLET BY MOUTH DAILY 90 tablet 3   Semaglutide  (RYBELSUS ) 7 MG TABS Take 1 tablet (7 mg total) by mouth daily. 30 tablet 5   tamsulosin (FLOMAX) 0.4 MG CAPS capsule Take 0.4 mg by mouth daily.     torsemide  (DEMADEX ) 20 MG tablet Take 1 tablet (20 mg total) by mouth daily. 90 tablet 3   triamcinolone  cream (KENALOG ) 0.1 % Apply 1 Application topically 2 (two) times daily. 30 g 0   No current facility-administered medications for this visit.    Allergies:   Codeine, Latex, and Lipitor [atorvastatin]   Social History:  The patient  reports that he quit smoking about 4 years ago. His smoking use included cigarettes. He started smoking about 44 years ago. He has a 60 pack-year smoking history. He has been exposed to tobacco  smoke. He has never used smokeless tobacco. He reports that he does not currently use alcohol. He reports that he does not use drugs.   Family History:  The patient's family history includes Aneurysm in his paternal uncle; Aortic stenosis in his father; Asthma in his sister; Cancer in his mother, paternal uncle, and paternal uncle; Colon cancer in his paternal uncle; Diabetes in his mother; Early death in his paternal aunt and paternal uncle; Heart disease in his father and mother; Hyperlipidemia in his father and mother; Hypertension in his father and mother; Miscarriages / [place] in his mother; Ovarian cancer in his sister; Stroke in his mother; Varicose Veins in his mother.  ROS:  Please see the history of present illness.    All other systems are reviewed and otherwise negative.   PHYSICAL EXAM:  VS:  There were no vitals taken for this visit. BMI: There is no height or weight on file to calculate BMI. Well nourished, well developed, in no acute distress HEENT: normocephalic, atraumatic Neck: no JVD, carotid bruits or  masses Cardiac: *** RRR; no significant murmurs, no rubs, or gallops Lungs: *** CTA b/l, no wheezing, rhonchi or rales Abd: soft, nontender MS: no deformity or atrophy Ext: ***  L>R edema compression hose LLE Skin: warm and dry, no rash Neuro:  No gross deficits appreciated Psych: euthymic mood, full affect   EKG:  done today and reviewed by myself ***   01/25/24: EPS/ablation CONCLUSIONS: 1. Sinus rhythm upon presentation.   2.  Induction and successful ablation of CTI dependent atrial flutter 3.  Induction of an atypical flutter without targets for ablation  4.  DC cardioversion  5. successful ablation of all four pulmonary veins with pulsed field energy to address potential triggers for flutter and possible atrial fibrillation 6. Successful ablation of the posterior wall of the left atrium with pulsed field energy 7. No early apparent complications.   11/22/23; TTE 1. Left ventricular ejection fraction, by estimation, is 60 to 65%. The  left ventricle has normal function. The left ventricle has no regional  wall motion abnormalities. There is mild concentric left ventricular  hypertrophy. Left ventricular diastolic  parameters are consistent with Grade I diastolic dysfunction (impaired  relaxation).   2. Right ventricular systolic function is normal. The right ventricular  size is normal. Tricuspid regurgitation signal is inadequate for assessing  PA pressure.   3. The mitral valve is normal in structure. No evidence of mitral valve  regurgitation. No evidence of mitral stenosis.   4. The aortic valve is tricuspid. There is mild calcification of the  aortic valve. Aortic valve regurgitation is not visualized. No aortic  stenosis is present.   5. Aortic dilatation noted. There is mild dilatation of the aortic root,  measuring 39 mm.   6. The IVC is not well-visualized.    July 2024: monitor Impression:   Predominant rhythm:  SR   44 to 114 bpm  Average HR 67 bpm    Occasional PACs (4.1% total)   Rare PACs.   3 short bursts of SVT, fastest at 5 beats for 146 bpm, longest for 7 beats at 99bpm    Triggered events corresponded to SR with PACs  03/02/22: ETT The ECG was negative for ischemia. No ST deviation was noted.   Exercise capacity was mildly impaired. Patient exercised for 5 min and 0 sec. Maximum HR of 125 bpm. MPHR 81.0 %. Peak METS 7.0 .   Hypertensive response to exercise.  01/16/22: EPS/ablation Impression: #1-normal sinus node function #2 abnormal atrial function manifested by sustained atrial flutter. Catheter ablation successfully eliminated the substrate for isthmus dependent flutter #3-normal AV nodal function #4-normal His-Purkinje system function #5-no evidence of accessory pathway #6-normal ventricular function as described above.  Pt had a variety of post catheter arrhythmia including different flutters. It was elected to porceed as his clinical rhythm had been typical atrial flutter and this had prompted repeated hospital therapies    12/06/21: TTE 1. Left ventricular ejection fraction, by estimation, is 65 to 70%. The  left ventricle has hyperdynamic function. The left ventricle has no  regional wall motion abnormalities. There is mild concentric left  ventricular hypertrophy. Left ventricular  diastolic function could not be evaluated.   2. Right ventricular systolic function was not well visualized. The right  ventricular size is normal. There is normal pulmonary artery systolic  pressure.   3. The mitral valve is normal in structure. Mild mitral valve  regurgitation. No evidence of mitral stenosis.   4. The aortic valve is normal in structure. Aortic valve regurgitation is  not visualized. No aortic stenosis is present.   5. The inferior vena cava is dilated in size with >50% respiratory  variability, suggesting right atrial pressure of 8 mmHg.    Recent Labs: 06/19/2023: NT-Pro BNP 181; TSH 4.89 12/16/2023: ALT  39 12/27/2023: Platelets 257 01/25/2024: Hemoglobin 11.6 03/04/2024: BUN 21; Creatinine, Ser 1.22; Magnesium 1.9; Potassium 3.8; Sodium 139  06/19/2023: Cholesterol 127; HDL 38.30; LDL Cholesterol 56; Total CHOL/HDL Ratio 3; Triglycerides 164.0; VLDL 32.8   CrCl cannot be calculated (Patient's most recent lab result is older than the maximum 21 days allowed.).   Wt Readings from Last 3 Encounters:  04/18/24 273 lb (123.8 kg)  03/04/24 285 lb 6.4 oz (129.5 kg)  02/22/24 285 lb 12.8 oz (129.6 kg)     Other studies reviewed: Additional studies/records reviewed today include: summarized above  ASSESSMENT AND PLAN:  AFlutter (atypical) Persistent AFib CHA2DS2Vasc is 2, on Eliquis , *** appropriately dosed    2. Secondary hypercoagulable state  3. HFpEF LE edema, skin changes Varicose veins noted at his last visit *** Seeing VVS    Disposition: ***, sooner if needed   Current medicines are reviewed at length with the patient today.  The patient did not have any concerns regarding medicines.  Bonney Charlies Arthur, PA-C 04/28/2024 9:51 AM     [facility] [address] [place] [address] [phone] (office)  [phone] (fax)

## 2024-04-29 ENCOUNTER — Ambulatory Visit: Attending: Physician Assistant | Admitting: Physician Assistant

## 2024-04-29 ENCOUNTER — Encounter: Payer: Self-pay | Admitting: Physician Assistant

## 2024-04-29 VITALS — BP 104/60 | HR 78 | Ht 75.0 in | Wt 275.0 lb

## 2024-04-29 DIAGNOSIS — I5032 Chronic diastolic (congestive) heart failure: Secondary | ICD-10-CM

## 2024-04-29 DIAGNOSIS — I4819 Other persistent atrial fibrillation: Secondary | ICD-10-CM | POA: Diagnosis not present

## 2024-04-29 DIAGNOSIS — I484 Atypical atrial flutter: Secondary | ICD-10-CM

## 2024-04-29 DIAGNOSIS — D6869 Other thrombophilia: Secondary | ICD-10-CM

## 2024-04-29 NOTE — Patient Instructions (Signed)
 Medication Instructions:   Your physician recommends that you continue on your current medications as directed. Please refer to the Current Medication list given to you today.   *If you need a refill on your cardiac medications before your next appointment, please call your pharmacy*    Lab Work: NONE ORDERED  TODAY     If you have labs (blood work) drawn today and your tests are completely normal, you will receive your results only by: MyChart Message (if you have MyChart) OR A paper copy in the mail If you have any lab test that is abnormal or we need to change your treatment, we will call you to review the results.    Testing/Procedures:  NONE ORDERED  TODAY    Follow-Up:  At [facility], you and your health needs are our priority.  As part of our continuing mission to provide you with exceptional heart care, our providers are all part of one team.  This team includes your primary Cardiologist (physician) and Advanced Practice Providers or APPs (Physician Assistants and Nurse Practitioners) who all work together to provide you with the care you need, when you need it.   Your next appointment:     6 month(s)  Provider:  You may see Eulas FORBES Furbish, MD  or  Charlies Arthur, PA-C      We recommend signing up for the patient portal called MyChart.  Sign up information is provided on this After Visit Summary.  MyChart is used to connect with patients for Virtual Visits (Telemedicine).  Patients are able to view lab/test results, encounter notes, upcoming appointments, etc.  Non-urgent messages can be sent to your provider as well.   To learn more about what you can do with MyChart, go to [email or web address].   Other Instructions

## 2024-04-30 ENCOUNTER — Other Ambulatory Visit: Payer: Self-pay

## 2024-04-30 DIAGNOSIS — E1165 Type 2 diabetes mellitus with hyperglycemia: Secondary | ICD-10-CM

## 2024-04-30 NOTE — Progress Notes (Signed)
   04/30/2024  Patient ID: Oneil Clubs, male   DOB: Jan 05, 1956, 68 y.o.   MRN: [id number]  Diabetes: Current medications: Rybelsus  7mg  daily, metformin  XR 750mg  BID -Patient endorses tolerating metformin  well; states he was taking 2 tablets in the morning, which caused stomach cramping- this resolved once he started taking 750mg  BID -Patient was recently switched to Ozempic  1mg  weekly but did not tolerate well, and is now back on Rybelsus  7mg  daily.  Experienced some diarrhea the first couple of days but is now tolerating well with no ADE's. -States BG has increased some since switching back to Rybelsus :  FBG 130-145, post-prandial 145-190 -Patient denies hypoglycemic s/sx including dizziness, shakiness, sweating.  -Patient denies hyperglycemic symptoms including polyuria, polydipsia, polyphagia, nocturia, neuropathy, blurred vision. -Endorses Rybelsus  affordable at $45 copay -A1c 6.5% 7/8, down from 12%   Assessment/Plan:    Diabetes: -Currently controlled -Continue current regimen at this time -Consider increasing Rybelsus  to 14mg  daily if patient continues to tolerate well after another month or two; could also consider increasing metformin  to 1000mg  BID (taking XR 500 tablets 2 BID) if unable to increase Rybelsus .  I do not believe it is necessary to resume glipizide  xl 2.5mg  due to risk of hypoglycemia and likelihood of weight gain in patient attempting to lose weight.  Follow Up Plan: 1 month   [name], PharmD, DPLA

## 2024-05-02 DIAGNOSIS — G4733 Obstructive sleep apnea (adult) (pediatric): Secondary | ICD-10-CM | POA: Diagnosis not present

## 2024-05-02 DIAGNOSIS — D6869 Other thrombophilia: Secondary | ICD-10-CM | POA: Diagnosis not present

## 2024-05-07 NOTE — Telephone Encounter (Signed)
 Copied from CRM [id number]. Topic: Clinical - Medication Question >> May 07, 2024 10:06 AM Robinson H wrote: Reason for CRM: Bernardino calling to get clarity on patients medication states patient received Ozempic  in July and shows he's still taking the Semaglutide  (RYBELSUS ) 7 MG TABS as well wants to know if he should be taking both.  Ryan[facility] [facility] [place] [phone] Direct line

## 2024-05-07 NOTE — Telephone Encounter (Signed)
 Spoke to [employer] [place] and inform him that patient is currently taking (Semaglutide  (RYBELSUS ) 7 MG TABS ) due to side effects with Ozempic  (abdominal pain and diarrhea). Ryan verbalized understanding.

## 2024-05-28 DIAGNOSIS — E119 Type 2 diabetes mellitus without complications: Secondary | ICD-10-CM | POA: Diagnosis not present

## 2024-05-29 NOTE — Progress Notes (Signed)
 Richard Vazquez                                          MRN: [id number]   05/29/2024   The [facility]BCI Quality Team Specialist reviewed this patient medical record for the purposes of chart review for care gap closure. The following were reviewed: chart review for care gap closure-kidney health evaluation for diabetes:eGFR  and uACR.    VBCI Quality Team

## 2024-05-30 ENCOUNTER — Other Ambulatory Visit: Payer: Self-pay

## 2024-05-30 DIAGNOSIS — E1165 Type 2 diabetes mellitus with hyperglycemia: Secondary | ICD-10-CM

## 2024-05-30 NOTE — Progress Notes (Signed)
   05/30/2024  Patient ID: Richard Vazquez, male   DOB: November 06, 1955, 68 y.o.   MRN: [id number]  Diabetes: Current medications: Rybelsus  7mg  daily, metformin  XR 750mg  BID -Patient endorses tolerating metformin  well; states he was taking 2 tablets in the morning, which caused stomach cramping- this resolved once he started taking 750mg  BID -Patient was  switched to Ozempic  1mg  weekly but did not tolerate well, and is now back on Rybelsus  7mg  daily.  Experienced some diarrhea the first couple of days but is now tolerating well with no ADE's. -States BG has increased some since switching back to Rybelsus :  FBG 130-145, post-prandial 145-190 -Patient denies hypoglycemic s/sx including dizziness, shakiness, sweating.  -Patient denies hyperglycemic symptoms including polyuria, polydipsia, polyphagia, nocturia, neuropathy, blurred vision. -Endorses Rybelsus  affordable at $45 copay -A1c 6.5% 7/8, down from 12% -No ACEi/ARB on board for cardiorenal protection; no recent UACR  -Statin on board for ASCVD risk reduction:  rosuvastatin  20mg  daily   Assessment/Plan:    Diabetes: -Currently controlled based on most recent A1c -BP is controlled LDL is at goal -Opportunities for improvement in regard to cardiorenal protection -Sees PCP again 10/22 and will be due for A1c; I would also recommend UACR at this time.  If UACR >30, consider addition of SGLT2 (if no PHM of frequent genitourinary infections) or low-dose ACEi/ARB.  May need to decrease dose of another BP lowering medication based on last OV BP. -I recommend increasing Rybelsus  to 14mg  daily at upcoming PCP visit for further A1c improvement as well as potential for some additional weight loss benefit -Continue to monitor and record home FBG daily  Follow Up Plan: 11/11   [name], PharmD, DPLA

## 2024-06-04 ENCOUNTER — Ambulatory Visit: Admitting: Family Medicine

## 2024-06-09 ENCOUNTER — Other Ambulatory Visit: Payer: Self-pay | Admitting: Family Medicine

## 2024-06-09 DIAGNOSIS — E1165 Type 2 diabetes mellitus with hyperglycemia: Secondary | ICD-10-CM

## 2024-06-18 ENCOUNTER — Ambulatory Visit: Payer: Self-pay | Admitting: Family Medicine

## 2024-06-18 ENCOUNTER — Ambulatory Visit: Admitting: Family Medicine

## 2024-06-18 ENCOUNTER — Telehealth: Payer: Self-pay

## 2024-06-18 ENCOUNTER — Encounter: Payer: Self-pay | Admitting: Family Medicine

## 2024-06-18 VITALS — BP 119/67 | HR 78 | Temp 97.2°F | Resp 18 | Wt 262.8 lb

## 2024-06-18 DIAGNOSIS — D508 Other iron deficiency anemias: Secondary | ICD-10-CM

## 2024-06-18 DIAGNOSIS — E66812 Obesity, class 2: Secondary | ICD-10-CM

## 2024-06-18 DIAGNOSIS — E559 Vitamin D deficiency, unspecified: Secondary | ICD-10-CM | POA: Diagnosis not present

## 2024-06-18 DIAGNOSIS — I25118 Atherosclerotic heart disease of native coronary artery with other forms of angina pectoris: Secondary | ICD-10-CM | POA: Diagnosis not present

## 2024-06-18 DIAGNOSIS — E1122 Type 2 diabetes mellitus with diabetic chronic kidney disease: Secondary | ICD-10-CM

## 2024-06-18 DIAGNOSIS — Z7984 Long term (current) use of oral hypoglycemic drugs: Secondary | ICD-10-CM

## 2024-06-18 DIAGNOSIS — E876 Hypokalemia: Secondary | ICD-10-CM

## 2024-06-18 DIAGNOSIS — N1831 Chronic kidney disease, stage 3a: Secondary | ICD-10-CM

## 2024-06-18 DIAGNOSIS — Z6835 Body mass index (BMI) 35.0-35.9, adult: Secondary | ICD-10-CM

## 2024-06-18 DIAGNOSIS — I4892 Unspecified atrial flutter: Secondary | ICD-10-CM | POA: Diagnosis not present

## 2024-06-18 LAB — CBC WITH DIFFERENTIAL/PLATELET
Basophils Absolute: 0.1 K/uL (ref 0.0–0.1)
Basophils Relative: 1.1 % (ref 0.0–3.0)
Eosinophils Absolute: 0.7 K/uL (ref 0.0–0.7)
Eosinophils Relative: 8.2 % — ABNORMAL HIGH (ref 0.0–5.0)
HCT: 40.5 % (ref 39.0–52.0)
Hemoglobin: 13.6 g/dL (ref 13.0–17.0)
Lymphocytes Relative: 14.1 % (ref 12.0–46.0)
Lymphs Abs: 1.2 K/uL (ref 0.7–4.0)
MCHC: 33.5 g/dL (ref 30.0–36.0)
MCV: 89.2 fl (ref 78.0–100.0)
Monocytes Absolute: 0.9 K/uL (ref 0.1–1.0)
Monocytes Relative: 10.3 % (ref 3.0–12.0)
Neutro Abs: 5.6 K/uL (ref 1.4–7.7)
Neutrophils Relative %: 66.3 % (ref 43.0–77.0)
Platelets: 260 K/uL (ref 150.0–400.0)
RBC: 4.54 Mil/uL (ref 4.22–5.81)
RDW: 15.7 % — ABNORMAL HIGH (ref 11.5–15.5)
WBC: 8.5 K/uL (ref 4.0–10.5)

## 2024-06-18 LAB — POCT GLYCOSYLATED HEMOGLOBIN (HGB A1C): Hemoglobin A1C: 5.9 % — AB (ref 4.0–5.6)

## 2024-06-18 LAB — COMPREHENSIVE METABOLIC PANEL WITH GFR
ALT: 17 U/L (ref 0–53)
AST: 20 U/L (ref 0–37)
Albumin: 4.3 g/dL (ref 3.5–5.2)
Alkaline Phosphatase: 53 U/L (ref 39–117)
BUN: 20 mg/dL (ref 6–23)
CO2: 36 meq/L — ABNORMAL HIGH (ref 19–32)
Calcium: 9.6 mg/dL (ref 8.4–10.5)
Chloride: 91 meq/L — ABNORMAL LOW (ref 96–112)
Creatinine, Ser: 1.33 mg/dL (ref 0.40–1.50)
GFR: 54.92 mL/min — ABNORMAL LOW (ref >60.00)
Glucose, Bld: 132 mg/dL — ABNORMAL HIGH (ref 70–99)
Potassium: 2.8 meq/L — CL (ref 3.5–5.1)
Sodium: 139 meq/L (ref 135–145)
Total Bilirubin: 0.6 mg/dL (ref 0.2–1.2)
Total Protein: 7.4 g/dL (ref 6.0–8.3)

## 2024-06-18 LAB — B12 AND FOLATE PANEL
Folate: >22.9 ng/mL (ref >5.9)
Vitamin B-12: 379 pg/mL (ref 211–911)

## 2024-06-18 LAB — MICROALBUMIN / CREATININE URINE RATIO
Creatinine,U: 126.1 mg/dL
Microalb Creat Ratio: 41.6 mg/g — ABNORMAL HIGH (ref 0.0–30.0)
Microalb, Ur: 5.2 mg/dL — ABNORMAL HIGH (ref 0.0–1.9)

## 2024-06-18 LAB — IRON,TIBC AND FERRITIN PANEL
%SAT: 19 % — ABNORMAL LOW (ref 20–48)
Ferritin: 103 ng/mL (ref 24–380)
Iron: 66 ug/dL (ref 50–180)
TIBC: 350 ug/dL (ref 250–425)

## 2024-06-18 LAB — GLUCOSE, POCT (MANUAL RESULT ENTRY): POC Glucose: 126 mg/dL — AB (ref 70–99)

## 2024-06-18 LAB — VITAMIN D 25 HYDROXY (VIT D DEFICIENCY, FRACTURES): VITD: 71 ng/mL (ref 30.00–100.00)

## 2024-06-18 MED ORDER — METFORMIN HCL ER 750 MG PO TB24: 750.0000 mg | ORAL_TABLET | Freq: Every evening | ORAL | 3 refills | Status: AC

## 2024-06-18 MED ORDER — SEMAGLUTIDE 14 MG PO TABS: 1.0000 | ORAL_TABLET | Freq: Every day | ORAL | 3 refills | Status: AC

## 2024-06-18 NOTE — Progress Notes (Signed)
 Assessment & Plan   Assessment/Plan:     Assessment & Plan Type 2 diabetes mellitus Well-controlled with A1c of 5.9, down from 6.5 four months ago. Reports higher blood sugar readings recently, with fasting levels around 130-140 mg/dL and postprandial levels up to 196 mg/dL. Intolerant to semaglutide  (Ozempic ) due to GI side effects. - Increase Rybelsus  to 14 mg daily - Order recheck of microalbumin and creatinine ratio - Order complete metabolic panel to assess GFR  Hypertension Well-controlled with blood pressure of 119/67 mmHg. Current medications include diltiazem  240 mg daily, indapamide  2.5 mg daily, and torsemide  20 mg daily.  Atrial fibrillation Managed with Eliquis  5 mg BID for anticoagulation and rate control with diltiazem  240 mg.  Chronic obstructive pulmonary disease (COPD) Symptoms have improved. - Continue albuterol  use as needed  Obesity BMI of 32.9. Weight has decreased by 13 pounds since the last visit.  Likely  improved with Rybelsus .  - Continue low-carb diet and exercise - Monitor Weight  Anemia and iron deficiency Reports stools with odor similar to iron tablets. Plan to evaluate for anemia causes and assess current iron levels. - Order CBC with differential and platelet count - Order iron studies - Order B12 and folate levels  Vitamin D  deficiency Continues supplementation. - Order vitamin D  level  Benign prostatic hyperplasia (BPH) No current urinary symptoms. 09/2023 PSA was normal. - Continue to follow with urology      Medications Discontinued During This Encounter  Medication Reason   Semaglutide  (RYBELSUS ) 7 MG TABS    metFORMIN  (GLUCOPHAGE -XR) 750 MG 24 hr tablet      Return in about 3 months (around 09/18/2024) for BP, DM, HLD.        Subjective:   Encounter date: 06/18/2024  Richard Vazquez is a 68 y.o. male who has Atrial flutter (HCC); Hyperlipemia; Hematuria; Carotid artery disease; Persistent atrial fibrillation (HCC); CAD  (coronary artery disease); Shift work sleep disorder; OSA (obstructive sleep apnea); Class 2 severe obesity due to excess calories with serious comorbidity and body mass index (BMI) of 35.0 to 35.9 in adult; Renal stones; Prediabetes; Chronic constipation; Rash; Dyspnea on exertion; Bilateral edema of lower extremity; Vitamin D  deficiency; Psoriasis; Venous stasis of both lower extremities; Varicose veins of left lower extremity with pain; Absolute anemia; Benign prostatic hyperplasia with urinary frequency; Primary hypertension; Type 2 diabetes mellitus with hyperglycemia, without long-term current use of insulin  (HCC); Hypokalemia; and Simple chronic bronchitis (HCC) on their problem list..   He  has a past medical history of Acute pain of right knee (05/16/2022), Arrhythmia, Atrial fibrillation (HCC), Carotid artery disease, Colon polyps, COPD (chronic obstructive pulmonary disease) (HCC), Former smoker (12/21/2015), Hyperlipemia, Hypertension (Not sure), Kidney stones, Left carotid bruit (08/05/2021), Obesity, Paroxysmal atrial flutter (HCC) (05/29/2021), Sleep apnea (February 2023), and Sleep apnea treated with nocturnal bilevel positive airway pressure (BPAP).SABRA   He presents with chief complaint of COPD and Diabetes (3 month follow up. Pt is fasting today. Pt c/o of elevated glucose levels at home ranging from 114-193.//HM due- none ) .   Discussed the use of AI scribe software for clinical note transcription with the patient, who gave verbal consent to proceed.  History of Present Illness 04/18/2024  Richard Vazquez is a 68 year old male with diabetes who presents for follow-up on medication intolerance.  He has been experiencing significant nausea with semaglutide  (Ozempic ) injections, particularly at the 1 mg dose. His A1c was at goal at 6.5% approximately one and a half months ago, down from 12%  four months ago. This regimen included semaglutide  1 mg once a week and metformin  1500 mg daily. He  previously used glipizide  2.5 mg extended release, but it was discontinued as his A1c was at goal.  He experiences nausea and a 'stabbing pain' in the right upper quadrant, which he associates with the semaglutide  injections. He also noticed changes in his bowel movements, describing them as more yellow and fatty, which he attributes to bile changes.  He has a history of atrial fibrillation and underwent an ablation in May. He started experiencing more episodes of AFib after beginning Ozempic  on August 9th, with symptoms starting around August 12th. He occasionally feels the AFib but often does not notice any symptoms even when his heart rate is recorded as AFib. He is currently on Eliquis  and diltiazem  240 mg.  He experiences shortness of breath, particularly when walking in humid conditions, which he attributes to his COPD. He has used an albuterol  inhaler in the past during a flu episode, which helped alleviate his symptoms.  His current medications include metformin  1500 mg daily, diltiazem  240 mg, Eliquis , and he has previously used glipizide  and semaglutide .  06/18/2024  Richard Vazquez is a 68 year old male with diabetes who presents for follow-up.  Glycemic control and diabetes management - Diabetes mellitus with intolerance to semaglutide  and metformin  in the past - A1c decreased from 12% to 6.5% over the past four months; currently 5.9% - Recent higher blood glucose readings: fasting 130-140 mg/dL, postprandial up to 803 mg/dL - Current medications: metformin  750 mg twice daily, Rybelsus  7 mg daily - Weight loss of 13 pounds since last visit - Current BMI 32.9  Pulmonary symptoms - Chronic obstructive pulmonary disease with improvement in symptoms  Cardiovascular history and anticoagulation - History of coronary artery disease and atrial fibrillation - Current medications: diltiazem  240 mg daily, indapamide  2.5 mg daily, torsemide  20 mg daily, Eliquis  5 mg twice daily  Nutritional  deficiencies and gastrointestinal symptoms - History of vitamin D  deficiency and iron deficiency anemia - Occasional abdominal discomfort - Stools sometimes have odor similar to iron tablets, raising concern for possible excess iron intake - Uncertain current vitamin D  and iron levels  Genitourinary symptoms - History of benign prostatic hyperplasia and kidney stones - No current urinary problems - Normal PSA earlier this year   Lab Results  Component Value Date   HGBA1C 5.9 (A) 06/18/2024     ROS  Past Surgical History:  Procedure Laterality Date   A-FLUTTER ABLATION N/A 01/16/2022   Procedure: A-FLUTTER ABLATION;  Surgeon: Fernande Elspeth BROCKS, MD;  Location: [facility] INVASIVE CV LAB;  Service: Cardiovascular;  Laterality: N/A;   A-FLUTTER ABLATION N/A 01/25/2024   Procedure: A-FLUTTER ABLATION;  Surgeon: Nancey Eulas BRAVO, MD;  Location: [facility] INVASIVE CV LAB;  Service: Cardiovascular;  Laterality: N/A;   APPENDECTOMY  1971   ATRIAL FIBRILLATION ABLATION N/A 01/25/2024   Procedure: ATRIAL FIBRILLATION ABLATION;  Surgeon: Nancey Eulas BRAVO, MD;  Location: [facility] INVASIVE CV LAB;  Service: Cardiovascular;  Laterality: N/A;   CARDIOVERSION N/A 05/31/2021   Procedure: CARDIOVERSION;  Surgeon: Mona Vinie BROCKS, MD;  Location: [facility] ENDOSCOPY;  Service: Cardiovascular;  Laterality: N/A;   CARDIOVERSION N/A 10/05/2021   Procedure: CARDIOVERSION;  Surgeon: Mona Vinie BROCKS, MD;  Location: [facility] ENDOSCOPY;  Service: Cardiovascular;  Laterality: N/A;   CARDIOVERSION N/A 11/01/2023   Procedure: CARDIOVERSION;  Surgeon: Santo Stanly LABOR, MD;  Location: [facility] INVASIVE CV LAB;  Service: Cardiovascular;  Laterality: N/A;   COLONOSCOPY  LEFT HEART CATH AND CORONARY ANGIOGRAPHY N/A 12/07/2021   Procedure: LEFT HEART CATH AND CORONARY ANGIOGRAPHY;  Surgeon: Wendel Lurena POUR, MD;  Location: [facility] INVASIVE CV LAB;  Service: Cardiovascular;  Laterality: N/A;   LITHOTRIPSY     multiple   TEE WITHOUT CARDIOVERSION  N/A 05/31/2021   Procedure: TRANSESOPHAGEAL ECHOCARDIOGRAM (TEE);  Surgeon: Mona Vinie BROCKS, MD;  Location: [facility] ENDOSCOPY;  Service: Cardiovascular;  Laterality: N/A;    Outpatient Medications Prior to Visit  Medication Sig Dispense Refill   albuterol  (VENTOLIN  HFA) 108 (90 Base) MCG/ACT inhaler Inhale 2 puffs into the lungs every 6 (six) hours as needed for wheezing or shortness of breath. 8 g 0   bisacodyl  (DULCOLAX) 5 MG EC tablet Take 5 mg by mouth daily as needed for moderate constipation or severe constipation. (Patient taking differently: Take 5 mg by mouth as needed for moderate constipation or severe constipation.)     CAPVAXIVE 0.5 ML injection      cholecalciferol (VITAMIN D3) 25 MCG (1000 UNIT) tablet Take 1,000 Units by mouth daily.     diltiazem  (CARDIZEM  CD) 240 MG 24 hr capsule TAKE 1 CAPSULE BY MOUTH DAILY 90 capsule 3   ELIQUIS  5 MG TABS tablet TAKE 1 TABLET BY MOUTH TWICE A DAY 180 tablet 3   indapamide  (LOZOL ) 2.5 MG tablet Take 2.5 mg by mouth daily.     Iron, Ferrous Sulfate , 325 (65 Fe) MG TABS Take 65 mg by mouth every other day.     Multiple Vitamin (MULTIVITAMIN) tablet Take 1 tablet by mouth daily.     nitroGLYCERIN  (NITROSTAT ) 0.4 MG SL tablet Place 1 tablet (0.4 mg total) under the tongue every 5 (five) minutes as needed for chest pain (Max 3 doses). 25 tablet 2   Omega-3 1000 MG CAPS Take 1,000 mg by mouth daily.     polyethylene glycol (MIRALAX  / GLYCOLAX ) 17 g packet Take 17 g by mouth 2 (two) times daily. (Patient taking differently: Take 17 g by mouth daily.) 180 packet 3   potassium chloride  SA (KLOR-CON  M) 20 MEQ tablet Take 1 tablet (20 mEq total) by mouth 2 (two) times daily. 180 tablet 2   rosuvastatin  (CRESTOR ) 20 MG tablet TAKE 1 TABLET BY MOUTH DAILY 90 tablet 3   tamsulosin (FLOMAX) 0.4 MG CAPS capsule Take 0.4 mg by mouth daily.     torsemide  (DEMADEX ) 20 MG tablet Take 1 tablet (20 mg total) by mouth daily. 90 tablet 3   triamcinolone  cream (KENALOG )  0.1 % Apply 1 Application topically 2 (two) times daily. 30 g 0   metFORMIN  (GLUCOPHAGE -XR) 750 MG 24 hr tablet TAKE 2 TABLETS BY MOUTH DAILY WITH BREKAFAST 180 tablet 0   Semaglutide  (RYBELSUS ) 7 MG TABS Take 1 tablet (7 mg total) by mouth daily. 30 tablet 5   blood glucose meter kit and supplies KIT Use up to four times daily as directed.     No facility-administered medications prior to visit.    Family History  Problem Relation Age of Onset   Diabetes Mother    Cancer Mother    Heart disease Mother    Hyperlipidemia Mother    Hypertension Mother    Miscarriages / [place] Mother    Stroke Mother    Varicose Veins Mother    Heart disease Father        smoker   Aortic stenosis Father    Hyperlipidemia Father    Hypertension Father    Stroke Father    Ovarian  cancer Sister    Colon cancer Paternal Uncle    Aneurysm Paternal Uncle        Brain   Cancer Paternal Uncle        all in abdomin, ? colon cancer   Heart failure Paternal Grandmother    Asthma Sister    Cancer Paternal Uncle    Early death Paternal Aunt    Early death Paternal Uncle    Stomach cancer Neg Hx    Rectal cancer Neg Hx     Social History   Socioeconomic History   Marital status: Married    Spouse name: Not on file   Number of children: 2   Years of education: Not on file   Highest education level: 12th grade  Occupational History   Occupation: retired  Tobacco Use   Smoking status: Former    Current packs/day: 0.00    Average packs/day: 1.5 packs/day for 40.0 years (60.0 ttl pk-yrs)    Types: Cigarettes    Start date: 12/09/1979    Quit date: 12/09/2019    Years since quitting: 4.5    Passive exposure: Past   Smokeless tobacco: Never  Vaping Use   Vaping status: Never Used  Substance and Sexual Activity   Alcohol use: Not Currently   Drug use: Never   Sexual activity: Not Currently  Other Topics Concern   Not on file  Social History Narrative   Not on file   Social Drivers of  Health   Financial Resource Strain: Low Risk  (06/11/2024)   Overall Financial Resource Strain (CARDIA)    Difficulty of Paying Living Expenses: Not hard at all  Food Insecurity: No Food Insecurity (06/11/2024)   Hunger Vital Sign    Worried About Running Out of Food in the Last Year: Never true    Ran Out of Food in the Last Year: Never true  Transportation Needs: No Transportation Needs (06/11/2024)   PRAPARE - Administrator, Civil Service (Medical): No    Lack of Transportation (Non-Medical): No  Physical Activity: Unknown (06/11/2024)   Exercise Vital Sign    Days of Exercise per Week: Patient declined    Minutes of Exercise per Session: Not on file  Recent Concern: Physical Activity - Insufficiently Active (04/18/2024)   Exercise Vital Sign    Days of Exercise per Week: 3 days    Minutes of Exercise per Session: 30 min  Stress: No Stress Concern Present (06/11/2024)   [employer] of Occupational Health - Occupational Stress Questionnaire    Feeling of Stress: Only a little  Social Connections: Unknown (06/11/2024)   Social Connection and Isolation Panel    Frequency of Communication with Friends and Family: Never    Frequency of Social Gatherings with Friends and Family: Patient declined    Attends Religious Services: Never    Database administrator or Organizations: No    Attends Engineer, structural: Not on file    Marital Status: Married  Catering manager Violence: Not At Risk (04/18/2024)   Humiliation, Afraid, Rape, and Kick questionnaire    Fear of Current or Ex-Partner: No    Emotionally Abused: No    Physically Abused: No    Sexually Abused: No  Objective:  Physical Exam: BP 119/67 (BP Location: Left Arm, Patient Position: Sitting, Cuff Size: Large)   Pulse 78   Temp (!) 97.2 F (36.2 C) (Temporal)   Resp 18   Wt 262 lb 12.8 oz (119.2  kg)   SpO2 97%   BMI 32.85 kg/m   Wt Readings from Last 3 Encounters:  06/18/24 262 lb 12.8 oz (119.2 kg)  04/29/24 275 lb (124.7 kg)  04/18/24 273 lb (123.8 kg)    Physical Exam VITALS: BP- 119/67 MEASUREMENTS: BMI- 32.9. GENERAL: Alert, cooperative, well developed, no acute distress HEENT: Normocephalic, normal oropharynx, moist mucous membranes CHEST: Clear to auscultation bilaterally, no wheezes, rhonchi, or crackles CARDIOVASCULAR: Normal heart rate and rhythm, S1 and S2 normal without murmurs ABDOMEN: Soft, non-tender, non-distended, without organomegaly, normal bowel sounds EXTREMITIES: Edema improved with compression stockings NEUROLOGICAL: Cranial nerves grossly intact, moves all extremities without gross motor or sensory deficit   Physical Exam  No results found.   Recent Results (from the past 2160 hours)  POCT glucose (manual entry)     Status: Abnormal   Collection Time: 06/18/24  9:13 AM  Result Value Ref Range   POC Glucose 126 (A) 70 - 99 mg/dl  POCT glycosylated hemoglobin (Hb A1C)     Status: Abnormal   Collection Time: 06/18/24  9:18 AM  Result Value Ref Range   Hemoglobin A1C 5.9 (A) 4.0 - 5.6 %   HbA1c POC (<> result, manual entry)     HbA1c, POC (prediabetic range)     HbA1c, POC (controlled diabetic range)          Beverley Adine Hummer, MD, MS

## 2024-06-18 NOTE — Patient Instructions (Signed)
  VISIT SUMMARY: During your visit, we discussed your diabetes management, cardiovascular health, pulmonary symptoms, nutritional deficiencies, and genitourinary health. Your diabetes is well-controlled, but we will adjust your medication to address recent higher blood sugar readings. We also reviewed your other health conditions and made plans for further evaluations and continued management.  YOUR PLAN: TYPE 2 DIABETES MELLITUS: Your diabetes is well-controlled with an A1c of 5.9%, but you have had some higher blood sugar readings recently. -Increase Rybelsus  to 14 mg daily. -We will recheck your microalbumin and creatinine ratio. -We will order a complete metabolic panel to assess your kidney function.  HYPERTENSION: Your blood pressure is well-controlled at 119/67 mmHg. -Continue your current medications: diltiazem  240 mg daily, indapamide  2.5 mg daily, and torsemide  20 mg daily.  ATRIAL FIBRILLATION: Your atrial fibrillation is managed with Eliquis  for anticoagulation. -Continue taking Eliquis  5 mg twice daily.  CORONARY ARTERY DISEASE: You are following up with cardiology for your heart condition. -Continue your current heart management plan with your cardiologist.  CHRONIC OBSTRUCTIVE PULMONARY DISEASE (COPD): Your COPD symptoms have improved. -Continue your current management plan.  OBESITY: Your BMI is 32.9, and you have lost 13 pounds since your last visit. -Continue with your low-carb diet and exercise regimen.  ANEMIA AND IRON DEFICIENCY: You have reported stools with an odor similar to iron tablets, which may indicate excess iron intake. -We will order a complete blood count (CBC) with differential and platelet count. -We will order iron studies. -We will order B12 and folate levels.  VITAMIN D  DEFICIENCY: You have a history of vitamin D  deficiency and are continuing supplementation. -We will order a vitamin D  level to assess your current status.

## 2024-06-18 NOTE — Telephone Encounter (Signed)
 CRITICAL VALUE STICKER  CRITICAL VALUE: K 2.8  RECEIVER (on-site recipient of call): Brinly Maietta  DATE & TIME NOTIFIED: 06/18/24 1421  MESSENGER (representative from lab): Saa  MD NOTIFIED: Sebastian  TIME OF NOTIFICATION: 1424  RESPONSE:  Provider aware for review.

## 2024-06-19 ENCOUNTER — Ambulatory Visit: Payer: Self-pay | Admitting: Family Medicine

## 2024-06-19 ENCOUNTER — Other Ambulatory Visit (INDEPENDENT_AMBULATORY_CARE_PROVIDER_SITE_OTHER)

## 2024-06-19 DIAGNOSIS — E876 Hypokalemia: Secondary | ICD-10-CM | POA: Diagnosis not present

## 2024-06-19 LAB — BASIC METABOLIC PANEL WITH GFR
BUN: 23 mg/dL (ref 6–23)
CO2: 33 meq/L — ABNORMAL HIGH (ref 19–32)
Calcium: 9.5 mg/dL (ref 8.4–10.5)
Chloride: 95 meq/L — ABNORMAL LOW (ref 96–112)
Creatinine, Ser: 1.3 mg/dL (ref 0.40–1.50)
GFR: 56.44 mL/min — ABNORMAL LOW (ref >60.00)
Glucose, Bld: 126 mg/dL — ABNORMAL HIGH (ref 70–99)
Potassium: 3.1 meq/L — ABNORMAL LOW (ref 3.5–5.1)
Sodium: 140 meq/L (ref 135–145)

## 2024-06-19 LAB — MAGNESIUM: Magnesium: 1.7 mg/dL (ref 1.5–2.5)

## 2024-06-24 ENCOUNTER — Other Ambulatory Visit

## 2024-06-24 ENCOUNTER — Other Ambulatory Visit: Payer: Self-pay

## 2024-06-24 DIAGNOSIS — E876 Hypokalemia: Secondary | ICD-10-CM | POA: Diagnosis not present

## 2024-06-24 LAB — BASIC METABOLIC PANEL WITH GFR
BUN: 17 mg/dL (ref 6–23)
CO2: 25 meq/L (ref 19–32)
Calcium: 9.2 mg/dL (ref 8.4–10.5)
Chloride: 101 meq/L (ref 96–112)
Creatinine, Ser: 1.12 mg/dL (ref 0.40–1.50)
GFR: 67.49 mL/min (ref >60.00)
Glucose, Bld: 120 mg/dL — ABNORMAL HIGH (ref 70–99)
Potassium: 3.6 meq/L (ref 3.5–5.1)
Sodium: 138 meq/L (ref 135–145)

## 2024-06-26 ENCOUNTER — Ambulatory Visit: Payer: Self-pay | Admitting: Family Medicine

## 2024-06-28 DIAGNOSIS — E119 Type 2 diabetes mellitus without complications: Secondary | ICD-10-CM | POA: Diagnosis not present

## 2024-07-07 NOTE — Progress Notes (Unsigned)
   07/08/2024  Patient ID: Richard Vazquez, male   DOB: April 01, 1956, 68 y.o.   MRN: [id number]  Subjective/Objective: Telephone follow-up visit to address control of diabetes  Diabetes: Current medications: Rybelsus  14mg  daily, metformin  XR 750mg  daily -Patient was  switched to Ozempic  1mg  weekly but did not tolerate well, and is now back on Rybelsus  daily.  Experienced some diarrhea the first couple of days he was on Rybelsus  7mg , but this resolved.   -Rybelsus  was increased to 14mg  daily and metformin  decreased to XR 750mg  once daily at 10/22 PCP visit -Patient states BG seems to be running higher since Rybelsus  was increased and Metformin  was decreased -Patient denies hypoglycemic s/sx including dizziness, shakiness, sweating.  -Patient denies hyperglycemic symptoms including polyuria, polydipsia, polyphagia, nocturia, neuropathy, blurred vision. -A1c 5.8% on 10/22, down from 6.5% 7/8 -No ACEi/ARB on board for cardiorenal protection -UACR 41.6 on 10/22 -Statin on board for ASCVD risk reduction:  rosuvastatin  20mg  daily; LDL 54 October 2024  Hypokalemia: Current medications:  Klor Con 20meq BID -K was 2.8 on 10/22, so patient was advised to increase normal daily dose of 10meq BID to 20meq BID x3 days -He has continued to take 20meq BID  -K has increased to 3.6 as of 10/28   Assessment/Plan:    Diabetes: -Currently controlled with A1c <7% -BP at goal of <130/80 -Last LDL was at goal of <70, but patient due for lipid panel -Opportunities for improvement in regard to cardiorenal protection: consider addition of low dose ACEi/ARB or SGLT2 medication in the future -Continue Rybelsus  14mg  daily and metformin  XR 750mg  daily at this time -Continue to monitor FBG and post-prandial BG and record values; if elevated, we can increase metformin  back to XR 750mg  BID  Hypokalemia: -K is now WNL; consulting Dr. Lynn to see if he would like patient to resume previous dosing of 10meq BID  Follow  Up Plan: 2 weeks to follow-up on BG control   Richard Vazquez, PharmD, DPLA

## 2024-07-08 ENCOUNTER — Other Ambulatory Visit: Payer: Self-pay

## 2024-07-08 ENCOUNTER — Encounter: Payer: Self-pay | Admitting: Family Medicine

## 2024-07-08 DIAGNOSIS — I10 Essential (primary) hypertension: Secondary | ICD-10-CM

## 2024-07-08 DIAGNOSIS — E1165 Type 2 diabetes mellitus with hyperglycemia: Secondary | ICD-10-CM

## 2024-07-08 DIAGNOSIS — E782 Mixed hyperlipidemia: Secondary | ICD-10-CM

## 2024-07-08 NOTE — Progress Notes (Addendum)
   07/08/2024  Patient ID: Richard Vazquez, male   DOB: 1956-01-20, 68 y.o.   MRN: [id number]  Contacted patient to advise him to resume Klor Con at 10meq BID since K now wnl (per Dr. Sebastian).  Patient endorsed understanding.  [name], PharmD, DPLA

## 2024-07-11 NOTE — Telephone Encounter (Signed)
 We should adjust to 10 mEq of potassium chloride  twice a day. Then we can increase if we need to going forward.

## 2024-07-15 DIAGNOSIS — H524 Presbyopia: Secondary | ICD-10-CM | POA: Diagnosis not present

## 2024-07-15 LAB — OPHTHALMOLOGY REPORT-SCANNED

## 2024-07-21 NOTE — Progress Notes (Signed)
   07/22/24  Patient ID: Richard Vazquez, male   DOB: 02/07/1956, 68 y.o.   MRN: [id number]  Subjective/Objective: Telephone follow-up visit to address control of diabetes  Diabetes: Current medications: Rybelsus  14mg  daily, metformin  XR 750mg  daily -Patient was  switched to Ozempic  1mg  weekly but did not tolerate well, and is now back on Rybelsus  daily.  Experienced some diarrhea the first couple of days he was on Rybelsus  7mg , but this resolved.   -Rybelsus  was increased to 14mg  daily and metformin  decreased to XR 750mg  once daily at 10/22 PCP visit -Patient states BG seems to be running higher since Rybelsus  was increased and Metformin  was decreased, but readings have come down slightly since our last visit 2 weeks ago -FBG averaging 100-150, post-prandial 150-160 -Patient denies hypoglycemic s/sx including dizziness, shakiness, sweating.  -Patient denies hyperglycemic symptoms including polyuria, polydipsia, polyphagia, nocturia, neuropathy, blurred vision. -A1c 5.8% on 10/22, down from 6.5% 7/8 -No ACEi/ARB on board for cardiorenal protection -UACR 41.6 on 10/22 -Statin on board for ASCVD risk reduction:  rosuvastatin  20mg  daily; LDL 54 October 2024  Hypokalemia: Current medications:  Klor Con 20meq BID -K was 2.8 on 10/22, so patient was advised to double his dose x3 days, but he had continued to take 40meq BID until our last visit 2 weeks ago -K had increased to 3.6 as of 10/28, so patient has now resumed regular daily dosing of 20 meq BID   Assessment/Plan:    Diabetes: -Currently controlled with A1c <7% -BP at goal of <130/80 -Last LDL was at goal of <70, but patient due for lipid panel -Opportunities for improvement in regard to cardiorenal protection: consider addition of low dose ACEi/ARB or SGLT2 medication in the future -Continue Rybelsus  14mg  daily and metformin  XR 750mg  daily at this time- begin taking metformin  in the morning with breakfast versus evening with supper to see  if this will help with day time BG readings -Continue to monitor FBG and post-prandial BG and record values; if elevated, we can increase metformin  back to XR 750mg  BID  Hypokalemia: -Continue Klor Con 20meq BID  Follow Up Plan: 12/16   [name], PharmD, DPLA

## 2024-07-22 ENCOUNTER — Other Ambulatory Visit: Payer: Self-pay

## 2024-07-22 DIAGNOSIS — E1165 Type 2 diabetes mellitus with hyperglycemia: Secondary | ICD-10-CM

## 2024-08-01 ENCOUNTER — Other Ambulatory Visit: Payer: Self-pay | Admitting: Internal Medicine

## 2024-08-06 ENCOUNTER — Other Ambulatory Visit: Payer: Self-pay

## 2024-08-06 MED ORDER — DILTIAZEM HCL ER COATED BEADS 240 MG PO CP24: 240.0000 mg | ORAL_CAPSULE | Freq: Every day | ORAL | 2 refills | Status: AC

## 2024-08-10 NOTE — Progress Notes (Deleted)
 Appt cancelled

## 2024-08-11 ENCOUNTER — Ambulatory Visit: Admitting: Internal Medicine

## 2024-08-11 ENCOUNTER — Telehealth: Payer: Self-pay | Admitting: Internal Medicine

## 2024-08-11 MED ORDER — ROSUVASTATIN CALCIUM 20 MG PO TABS: 20.0000 mg | ORAL_TABLET | Freq: Every day | ORAL | 0 refills | Status: DC

## 2024-08-11 NOTE — Telephone Encounter (Signed)
*  STAT* If patient is at the pharmacy, call can be transferred to refill team.     1. Which medications need to be refilled? (please list name of each medication and dose if known) rosuvastatin  (CRESTOR ) 20 MG tablet      2. Would you like to learn more about the convenience, safety, & potential cost savings by using the [facility]? No     3. Are you open to using the [facility] (Type [facility].)     4. Which pharmacy/location (including street and city if local pharmacy) is medication to be sent to? [id number] - [place], [place] - [address]       5. Do they need a 30 day or 90 day supply? Fill to pt appt 09/11/24

## 2024-08-11 NOTE — Telephone Encounter (Signed)
 Pt's medication was sent to pt's pharmacy as requested. Confirmation received.

## 2024-08-11 NOTE — Progress Notes (Unsigned)
° °  08/12/2024  Patient ID: Richard Vazquez, male   DOB: 1956/04/21, 68 y.o.   MRN: [id number]  Subjective/Objective: Telephone follow-up visit to address control of diabetes  Diabetes: Current medications: Rybelsus  14mg  daily, metformin  XR 750mg  daily -Patient was  switched to Ozempic  1mg  weekly but did not tolerate well, and is now back on Rybelsus  daily.  Experienced some diarrhea the first couple of days he was on Rybelsus  7mg , but this resolved.   -Rybelsus  was increased to 14mg  daily and metformin  decreased to XR 750mg  once daily at 10/22 PCP visit -Patient states BG seems to continue to run higher since Rybelsus  was increased and Metformin  was decreased -FBG averaging 115-150, post-prandial 150-180; FBG had been 110-130 -Patient denies hypoglycemic s/sx including dizziness, shakiness, sweating.  -Patient denies hyperglycemic symptoms including polyuria, polydipsia, polyphagia, nocturia, neuropathy, blurred vision. -A1c 5.8% on 10/22, down from 6.5% 7/8 -No ACEi/ARB on board for cardiorenal protection -UACR 41.6 on 10/22 -Statin on board for ASCVD risk reduction:  rosuvastatin  20mg  daily; LDL 54 October 2024  Assessment/Plan:    Diabetes: -Currently controlled with A1c <7% -BP at goal of <130/80 -Last LDL was at goal of <70 -Opportunities for improvement in regard to cardiorenal protection based on last UACR: consider addition of low dose ACEi/ARB (like lisinopril 2.5mg ) or SGLT2 medication in the future -Continue Rybelsus  14mg  daily  -Increase metformin  XR 750mg  back to BID for improved BG control (patient preference at this time) -Continue to monitor FBG and post-prandial BG and record values -Patient sees PCP again 1/22 and will be due for follow-up A1c, UACR, CMP, and lipid panel  Follow Up Plan: 3 months   Richard Vazquez, PharmD, DPLA

## 2024-08-12 ENCOUNTER — Other Ambulatory Visit (INDEPENDENT_AMBULATORY_CARE_PROVIDER_SITE_OTHER): Payer: Self-pay

## 2024-08-12 ENCOUNTER — Telehealth: Payer: Self-pay

## 2024-08-12 DIAGNOSIS — E782 Mixed hyperlipidemia: Secondary | ICD-10-CM

## 2024-08-12 DIAGNOSIS — I10 Essential (primary) hypertension: Secondary | ICD-10-CM

## 2024-08-12 DIAGNOSIS — E1165 Type 2 diabetes mellitus with hyperglycemia: Secondary | ICD-10-CM

## 2024-08-12 NOTE — Progress Notes (Signed)
° °  08/12/2024  Patient ID: Richard Vazquez, male   DOB: [birth year], 68 y.o.   MRN: [id number]  Incoming call from patient in regard to OTC recommendations for current cold.  Patient is experiencing a productive cough with nasal congestion and pressure.  He has been using guaifenesin ER 600mg  BID for cough but found guaifenesin/phenlyephrine/APAP tablets at home and is inquiring about safetyefficacy.  No drug-drug interactions noted with current medications, and neither component should exacerbate any of his chronic conditions.  I advised patient to start with 1 capsule every 4-6 hours, but he can increase to 2 if needed,  Increase water intake to help break up congestion and make guaifenesin most effective.  Stop guaifenesin ER 600mg  while taking and do not take any additional APAP.  Can also use saline nasal rinse if needed.  Consult PCP or go to urgent care if symptoms do not improve within a few days or if they worsen.  [name], PharmD, DPLA

## 2024-09-07 ENCOUNTER — Other Ambulatory Visit: Payer: Self-pay | Admitting: Internal Medicine

## 2024-09-07 DIAGNOSIS — I4819 Other persistent atrial fibrillation: Secondary | ICD-10-CM

## 2024-09-08 NOTE — Progress Notes (Unsigned)
 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    Mr. Kotch established cardiology care in 2022 after being hospitalized for new atrial flutter with RVR. He was cardioverted and discharged on Eliquis . Developed recurrent AFL and was referred to EP for further management.   Echo 11/2021 showed LVEF 65-70%, mild LVH, mild MR. Cardiac catheterization 11/2021 showed mild non-obstructive CAD. He underwent AFL ablation 01/16/2022. He was back in AFL at office visit 02/02/2022. He was started on flecainide  and was cardioverted to NSR again.  During office visit 6/212024 he was noted to have new RBBB and prolonged PR and QRS. Flecainide  was stopped. 14-day monitor showed predominantly NSR with average HR 67 bpm, 4.1% PACs, 3 runs off SVT. Triggered events correlated with PACs. Did present to the ER with sustained AFL 03/19/2023. He was about to retire, noting arrhythmias correlating with stress from his job, and he deferred further anti-arrhythmics at that time.  Volume overloaded at visit 10/30/2023 and was started on torsemide . Echo 11/22/2023 showed LVEF 60-65%, mild LVH, G1DD. Referred for possible repeat AFL ablation. This was done 01/25/2024. Coronary CTA prior to the procedure showed coronary calcium  score 85 (45th percentile).   Seen 04/29/2024 and was doing well without symptoms of AF/AFL, though review of Apple Watch tracings did show some brief a fib episodes. He was advised to contact the office if these were consistent.    History of Present  Illness: Today he is doing okay. He has had alerts from his Apple Watch that he is in atrial fibrillation multiple times per week since November 11. However, he does not have associated symptoms as he has in the past. He has mild fluttering and twinges in his chest very intermittently that occur when he is sitting and only last for less than ten seconds. He has chronic dyspnea with walking up steps that he feels has actually improved in the past several months. He also has chronic LE edema, left greater than right, that is worse in the evenings and is no longer present when he wakes up in the morning. He wears compression stockings daily that help with this.   ROS: Denies orthopnea, PND, lightheadedness, dizziness, syncope, abnormal bleeding.   Studies Reviewed: The following studies were reviewed today:   Cardiac Studies & Procedures   ______________________________________________________________________________________________ CARDIAC CATHETERIZATION  CARDIAC CATHETERIZATION 12/07/2021  Conclusion   LV end diastolic pressure is normal.  1.  Mild obstructive coronary artery disease. 2.  LVEDP of 13 mmHg.  Recommendation: Medical therapy.  Findings Coronary Findings Diagnostic  Dominance: Right  Right Coronary Artery The vessel exhibits minimal luminal irregularities.  Intervention  No interventions have been documented.   STRESS TESTS  EXERCISE TOLERANCE TEST (ETT) 03/02/2022  Interpretation Summary   The ECG was negative for ischemia. No ST deviation was noted.   Exercise capacity was mildly impaired. Patient exercised for 5 min and 0 sec. Maximum HR of 125 bpm. MPHR 81.0 %. Peak METS 7.0 .   Hypertensive response to exercise.   ECHOCARDIOGRAM  ECHOCARDIOGRAM COMPLETE 11/22/2023  Narrative ECHOCARDIOGRAM  REPORT    Patient Name:   Richard Vazquez    Date of Exam: 11/22/2023 Medical Rec #:  [id number]     Height:       75.0 in Accession #:    [id number]    Weight:        297.0 lb Date of Birth:  05/15/1956     BSA:          2.596 m Patient Age:    67 years      BP:           126/72 mmHg Patient Gender: M             HR:           86 bpm. Exam Location:  [address]  Procedure: 2D Echo and Intracardiac Opacification Agent (Both Spectral and Color Flow Doppler were utilized during procedure).  Indications:    I50.9* Heart failure (unspecified)  History:        Patient has prior history of Echocardiogram examinations, most recent 12/06/2021. COPD, Arrythmias:Atrial Fibrillation and Atrial Flutter; Risk Factors:Former Smoker, Sleep Apnea and Dyslipidemia. Carotid artery disease. A-flutter ablation.  Sonographer:    Jon Hacker RCS Referring Phys: [phone] ELSPETH JAYSON SAGE  IMPRESSIONS   1. Left ventricular ejection fraction, by estimation, is 60 to 65%. The left ventricle has normal function. The left ventricle has no regional wall motion abnormalities. There is mild concentric left ventricular hypertrophy. Left ventricular diastolic parameters are consistent with Grade I diastolic dysfunction (impaired relaxation). 2. Right ventricular systolic function is normal. The right ventricular size is normal. Tricuspid regurgitation signal is inadequate for assessing PA pressure. 3. The mitral valve is normal in structure. No evidence of mitral valve regurgitation. No evidence of mitral stenosis. 4. The aortic valve is tricuspid. There is mild calcification of the aortic valve. Aortic valve regurgitation is not visualized. No aortic stenosis is present. 5. Aortic dilatation noted. There is mild dilatation of the aortic root, measuring 39 mm. 6. The IVC is not well-visualized.  FINDINGS Left Ventricle: Left ventricular ejection fraction, by estimation, is 60 to 65%. The left ventricle has normal function. The left ventricle has no regional wall motion abnormalities. The left ventricular internal cavity size was normal in size. There is mild concentric left  ventricular hypertrophy. Left ventricular diastolic parameters are consistent with Grade I diastolic dysfunction (impaired relaxation).  Right Ventricle: The right ventricular size is normal. No increase in right ventricular wall thickness. Right ventricular systolic function is normal. Tricuspid regurgitation signal is inadequate for assessing PA pressure.  Left Atrium: Left atrial size was normal in size.  Right Atrium: Right atrial size was normal in size.  Pericardium: There is no evidence of pericardial effusion.  Mitral Valve: The mitral valve is normal in structure. No evidence of mitral valve regurgitation. No evidence of mitral valve stenosis.  Tricuspid Valve: The tricuspid valve is normal in structure. Tricuspid valve regurgitation is not demonstrated.  Aortic Valve: The aortic valve is tricuspid. There is mild calcification of the aortic valve. Aortic valve regurgitation is not visualized. No aortic stenosis is present.  Pulmonic Valve: The pulmonic valve was normal in structure. Pulmonic valve regurgitation is not visualized.  Aorta: Aortic dilatation noted. There is mild dilatation of the aortic root, measuring 39 mm.  Venous: The IVC is not well-visualized.  IAS/Shunts: No atrial level shunt detected by color flow Doppler.   LEFT VENTRICLE PLAX 2D LVIDd:         3.90 cm  Diastology LVIDs:         2.70 cm   LV e' medial:    5.37 cm/s LV PW:         1.00 cm   LV E/e' medial:  9.4 LV IVS:        1.00 cm   LV e' lateral:   7.83 cm/s LVOT diam:     2.20 cm   LV E/e' lateral: 6.5 LV SV:         83 LV SV Index:   32 LVOT Area:     3.80 cm   RIGHT VENTRICLE RV S prime:     13.43 cm/s TAPSE (M-mode): 1.9 cm  LEFT ATRIUM             Index LA diam:        5.00 cm 1.93 cm/m LA Vol (A2C):   27.0 ml 10.40 ml/m LA Vol (A4C):   43.1 ml 16.60 ml/m LA Biplane Vol: 34.8 ml 13.41 ml/m AORTIC VALVE LVOT Vmax:   132.67 cm/s LVOT Vmean:  83.333 cm/s LVOT VTI:    0.219  m  AORTA Ao Root diam: 3.90 cm Ao Asc diam:  3.60 cm  MITRAL VALVE MV Area (PHT): 3.10 cm    SHUNTS MV Decel Time: 245 msec    Systemic VTI:  0.22 m MV E velocity: 50.63 cm/s  Systemic Diam: 2.20 cm MV A velocity: 73.90 cm/s MV E/A ratio:  0.69  Dalton McleanMD Electronically signed by Ezra Kanner Signature Date/Time: 11/22/2023/1:41:15 PM    Final   TEE  ECHO TEE 05/31/2021  Narrative TRANSESOPHOGEAL ECHO REPORT    Patient Name:   AHNAF CAPONI Date of Exam: 05/31/2021 Medical Rec #:  [id number]  Height:       75.0 in Accession #:    [id number] Weight:       277.0 lb Date of Birth:  1955-10-15  BSA:          2.520 m Patient Age:    65 years   BP:           100/57 mmHg Patient Gender: M          HR:           109 bpm. Exam Location:  Inpatient  Procedure: Transesophageal Echo, Color Doppler and Cardiac Doppler  Indications:     I48.91* Unspeicified atrial fibrillation  History:         Patient has prior history of Echocardiogram examinations, most recent 05/30/2021. Risk Factors:Dyslipidemia.  Sonographer:     Lauraine Pilot RDCS Referring Phys:  [id number] PAULA V [name] Diagnosing Phys: Vinie Maxcy MD  PROCEDURE: After discussion of the risks and benefits of a TEE, an informed consent was obtained from the patient. The transesophogeal probe was passed without difficulty through the esophogus of the patient. Local oropharyngeal anesthetic was provided with Cetacaine . Sedation performed by different physician. The patient was monitored while under deep sedation. Anesthestetic sedation was provided intravenously by Anesthesiology: 205.84mg  of Propofol . The patient's vital signs; including heart rate, blood pressure, and oxygen saturation; remained stable throughout the procedure. The patient developed no complications during the procedure.  IMPRESSIONS   1. Left ventricular ejection fraction, by estimation, is 65 to 70%. The left ventricle has normal function. The left  ventricle has no regional wall motion abnormalities. There is mild left ventricular hypertrophy. 2. Right ventricular systolic function is normal. The right ventricular size is normal. 3. No left atrial/left atrial appendage thrombus was detected. 4.  The mitral valve is grossly normal. Trivial mitral valve regurgitation. 5. The aortic valve is tricuspid. Aortic valve regurgitation is not visualized.  Conclusion(s)/Recommendation(s): No LA/LAA thrombus identified. Successful cardioversion performed with restoration of normal sinus rhythm.  FINDINGS Left Ventricle: Left ventricular ejection fraction, by estimation, is 65 to 70%. The left ventricle has normal function. The left ventricle has no regional wall motion abnormalities. The left ventricular internal cavity size was normal in size. There is mild left ventricular hypertrophy.  Right Ventricle: The right ventricular size is normal. No increase in right ventricular wall thickness. Right ventricular systolic function is normal.  Left Atrium: Left atrial size was normal in size. No left atrial/left atrial appendage thrombus was detected.  Right Atrium: Right atrial size was normal in size.  Pericardium: There is no evidence of pericardial effusion.  Mitral Valve: The mitral valve is grossly normal. Trivial mitral valve regurgitation.  Tricuspid Valve: The tricuspid valve is grossly normal. Tricuspid valve regurgitation is trivial.  Aortic Valve: The aortic valve is tricuspid. Aortic valve regurgitation is not visualized.  Pulmonic Valve: The pulmonic valve was normal in structure. Pulmonic valve regurgitation is not visualized.  Aorta: The aortic root and ascending aorta are structurally normal, with no evidence of dilitation.  IAS/Shunts: The interatrial septum appears to be lipomatous. No atrial level shunt detected by color flow Doppler.  Vinie Maxcy MD Electronically signed by Vinie Maxcy MD Signature Date/Time:  05/31/2021/9:40:51 AM    Final  MONITORS  LONG TERM MONITOR-LIVE TELEMETRY (3-14 DAYS) 03/14/2023  Narrative Patch Wear Time:  13 days and 9 hours (2024-06-25T19:58:51-0400 to 2024-07-09T05:20:30-0400)  Impression:  Predominant rhythm:  SR   44 to 114 bpm  Average HR 67 bpm   Occasional PACs (4.1% total)   Rare PACs.   3 short bursts of SVT, fastest at 5 beats for 146 bpm, longest for 7 beats at 99bpm  Triggered events corresponded to SR with PACs  _____________________________________________________________________________  Patient had a min HR of 44 bpm, max HR of 146 bpm, and avg HR of 67 bpm. Predominant underlying rhythm was Sinus Rhythm. First Degree AV Block was present. Bundle Branch Block/IVCD was present. 3 Supraventricular Tachycardia runs occurred, the run with the fastest interval lasting 5 beats with a max rate of 146 bpm, the longest lasting 7 beats with an avg rate of 99 bpm. Isolated SVEs were occasional (4.1%, 52238), SVE Couplets were rare (<1.0%, 534), and no SVE Triplets were present. Isolated VEs were rare (<1.0%), and no VE Couplets or VE Triplets were present.       ______________________________________________________________________________________________      EKG Interpretation Date/Time:  Thursday September 11 2024 09:38:56 EST Ventricular Rate:  105 PR Interval:    QRS Duration:  124 QT Interval:  378 QTC Calculation: 499 R Axis:   53  Text Interpretation: Sinus tachycardia with PACs Confirmed by Nola Numbers ([id number]) on 09/11/2024 10:31:45 AM            Physical Exam: VS: BP 126/76 (Cuff Size: Large)   Pulse (!) 105   Ht 6' 3 (1.905 m)   Wt 264 lb (119.7 kg)   SpO2 96%   BMI 33.00 kg/m  Wt Readings from Last 3 Encounters:  09/11/24 264 lb (119.7 kg)  06/18/24 262 lb 12.8 oz (119.2 kg)  04/29/24 275 lb (124.7 kg)   GEN: Well nourished, in NAD HEENT: Normal NECK: No JVD CARDIAC: Irregular rhythm, tachycardic RESPIRATORY: Clear to  auscultation without rales, wheezing or rhonchi  ABDOMEN:  Soft, non-tender, non-distended MUSCULOSKELETAL: Non-pitting LE edema L>R  SKIN: Warm and dry NEUROLOGIC:  Alert and oriented x 3 PSYCHIATRIC:  Pleasant, normal affect   Assessment & Plan: 1. Sinus tachycardia with PACs versus atrial flutter: S/p ablation 01/16/2022 and 01/25/2024. He has had alerts from his Apple Watch that he is in atrial fibrillation multiple times per week since 07/08/2024. In review of them, they do not all appear to be atrial fibrillation. However, could argue that some may be. He also had several short pauses that could indicate he is going in and out of rhythm. EKG today reviewed with Charlies Arthur, PA. Appears to be sinus tachycardia with PACs, but cannot completely rule out atrial flutter. The rate is relatively controlled. Fortunately he notes very intermittent non-concerning symptoms recently and none today. Otherwise, his vital signs are stable today as well, which is reassuring. - Order 14-day Zio - Check CMP and CBC today - Continue Eliquis  5 mg twice daily - Continue diltiazem  240 mg daily   2. Diastolic dysfunction: Noted to be volume overloaded during office visit with Dr. Fernande 10/30/2023 prior to ablation. Torsemide  and KCl started. He is still taking this daily. Does still note some dyspnea with walking up stairs but feels that it has improved in the past several months. Appears euvolemic on exam today and his weight is down from previous visit. - In the setting of possible AFL today, continue torsemide  20 mg daily and KCl 20 mEq twice daily for now - consider decreasing or using as needed once rhythm controlled - Check CMP today as above  3. Non-obstructive CAD: LHC 12/07/2021 showed mild non-obstructive CAD. Subsequent coronary CT prior to AFL ablation 01/04/2024 showed coronary calcium  score 85, which was the 45th percentile. He denies anginal symptoms today. EKG today without ischemic changes. - Check  fasting lipid panel today - Continue rosuvastatin  20 mg daily - Continue as needed SL nitroglycerin   4. Hyperlipidemia: 06/19/2023 LDL 56, HDL 38, TGs 164, total 127. - Check CMP and fasting lipid panel today - Continue rosuvastatin  20 mg daily    Dispo: Follow-up with EP APP in 4-6 weeks and general cardiology in 6 months.  Signed, Saddie GORMAN Cleaves, NP 09/11/2024 10:31 AM [facility] "

## 2024-09-09 ENCOUNTER — Other Ambulatory Visit: Payer: Self-pay | Admitting: Internal Medicine

## 2024-09-09 NOTE — Telephone Encounter (Signed)
 Patient contacted office and would like to schedule annual follow up with provider. Per last note patient needs ultrasound as well, requires initial scheduling. Prefers afternoon appointments. Request for call back to coordinate with other appointments.   Call back: [phone] Genna to leave detailed message

## 2024-09-11 ENCOUNTER — Ambulatory Visit

## 2024-09-11 ENCOUNTER — Ambulatory Visit: Attending: Nurse Practitioner

## 2024-09-11 ENCOUNTER — Encounter: Payer: Self-pay | Admitting: Nurse Practitioner

## 2024-09-11 VITALS — BP 126/76 | HR 105 | Ht 75.0 in | Wt 264.0 lb

## 2024-09-11 DIAGNOSIS — I2510 Atherosclerotic heart disease of native coronary artery without angina pectoris: Secondary | ICD-10-CM | POA: Diagnosis not present

## 2024-09-11 DIAGNOSIS — I5189 Other ill-defined heart diseases: Secondary | ICD-10-CM | POA: Diagnosis not present

## 2024-09-11 DIAGNOSIS — I4819 Other persistent atrial fibrillation: Secondary | ICD-10-CM

## 2024-09-11 DIAGNOSIS — E782 Mixed hyperlipidemia: Secondary | ICD-10-CM | POA: Diagnosis not present

## 2024-09-11 LAB — CBC

## 2024-09-11 NOTE — Patient Instructions (Signed)
 Medication Instructions:  Your physician recommends that you continue on your current medications as directed. Please refer to the Current Medication list given to you today.  *If you need a refill on your cardiac medications before your next appointment, please call your pharmacy*  Lab Work: CMET, CBC, Lipid panel today  Testing/Procedures: ZIO XT- Long Term Monitor Instructions  Your physician has requested you wear a ZIO patch monitor for 14 days.  This is a single patch monitor. [employer] supplies one patch monitor per enrollment. Additional stickers are not available. Please do not apply patch if you will be having a Nuclear Stress Test,  Echocardiogram, Cardiac CT, MRI, or Chest Xray during the period you would be wearing the  monitor. The patch cannot be worn during these tests. You cannot remove and re-apply the  ZIO XT patch monitor.  Your ZIO patch monitor will be mailed 3 day USPS to your address on file. It may take 3-5 days  to receive your monitor after you have been enrolled.  Once you have received your monitor, please review the enclosed instructions. Your monitor  has already been registered assigning a specific monitor serial # to you.  Billing and Patient Assistance Program Information  We have supplied [employer] with any of your insurance information on file for billing purposes. [employer] offers a sliding scale Patient Assistance Program for patients that do not have  insurance, or whose insurance does not completely cover the cost of the ZIO monitor.  You must apply for the Patient Assistance Program to qualify for this discounted rate.  To apply, please call [employer] at [phone], select option 4, select option 2, ask to apply for  Patient Assistance Program. Meredeth will ask your household income, and how many people  are in your household. They will quote your out-of-pocket cost based on that information.  [employer] will also be able to set up a 50-month,  interest-free payment plan if needed.  Applying the monitor   Shave hair from upper left chest.  Hold abrader disc by orange tab. Rub abrader in 40 strokes over the upper left chest as  indicated in your monitor instructions.  Clean area with 4 enclosed alcohol pads. Let dry.  Apply patch as indicated in monitor instructions. Patch will be placed under collarbone on left  side of chest with arrow pointing upward.  Rub patch adhesive wings for 2 minutes. Remove white label marked 1. Remove the white  label marked 2. Rub patch adhesive wings for 2 additional minutes.  While looking in a mirror, press and release button in center of patch. A small green light will  flash 3-4 times. This will be your only indicator that the monitor has been turned on.  Do not shower for the first 24 hours. You may shower after the first 24 hours.  Press the button if you feel a symptom. You will hear a small click. Record Date, Time and  Symptom in the Patient Logbook.  When you are ready to remove the patch, follow instructions on the last 2 pages of Patient  Logbook. Stick patch monitor onto the last page of Patient Logbook.  Place Patient Logbook in the blue and white box. Use locking tab on box and tape box closed  securely. The blue and white box has prepaid postage on it. Please place it in the mailbox as  soon as possible. Your physician should have your test results approximately 7 days after the  monitor has been mailed back to  [employer].  Call [facility] Customer Care at [phone] if you have questions regarding  your ZIO XT patch monitor. Call them immediately if you see an orange light blinking on your  monitor.  If your monitor falls off in less than 4 days, contact our Monitor department at [phone].  If your monitor becomes loose or falls off after 4 days call [employer] at [phone] for  suggestions on securing your monitor   Follow-Up: At [facility], you  and your health needs are our priority.  As part of our continuing mission to provide you with exceptional heart care, our providers are all part of one team.  This team includes your primary Cardiologist (physician) and Advanced Practice Providers or APPs (Physician Assistants and Nurse Practitioners) who all work together to provide you with the care you need, when you need it.  Your next appointment:   1 month Charlies Arthur PA 6 months Dr. Okey or Saddie Cleaves NP   We recommend signing up for the patient portal called MyChart.  Sign up information is provided on this After Visit Summary.  MyChart is used to connect with patients for Virtual Visits (Telemedicine).  Patients are able to view lab/test results, encounter notes, upcoming appointments, etc.  Non-urgent messages can be sent to your provider as well.   To learn more about what you can do with MyChart, go to [email or web address].   Other Instructions

## 2024-09-11 NOTE — Progress Notes (Unsigned)
 Enrolled patient for a 14 day Zio XT monitor to be mailed to patients home  [name] to read

## 2024-09-12 ENCOUNTER — Ambulatory Visit: Payer: Self-pay

## 2024-09-12 LAB — CBC
Hematocrit: 40.4 % (ref 37.5–51.0)
Hemoglobin: 13.5 g/dL (ref 13.0–17.7)
MCH: 29.9 pg (ref 26.6–33.0)
MCHC: 33.4 g/dL (ref 31.5–35.7)
MCV: 90 fL (ref 79–97)
Platelets: 268 x10E3/uL (ref 150–450)
RBC: 4.51 x10E6/uL (ref 4.14–5.80)
RDW: 14.3 % (ref 11.6–15.4)
WBC: 8.7 x10E3/uL (ref 3.4–10.8)

## 2024-09-12 LAB — COMPREHENSIVE METABOLIC PANEL WITH GFR
ALT: 13 IU/L (ref 0–44)
AST: 19 IU/L (ref 0–40)
Albumin: 4.4 g/dL (ref 3.9–4.9)
Alkaline Phosphatase: 67 IU/L (ref 47–123)
BUN/Creatinine Ratio: 13 (ref 10–24)
BUN: 18 mg/dL (ref 8–27)
Bilirubin Total: 0.4 mg/dL (ref 0.0–1.2)
CO2: 25 mmol/L (ref 20–29)
Calcium: 9.5 mg/dL (ref 8.6–10.2)
Chloride: 100 mmol/L (ref 96–106)
Creatinine, Ser: 1.37 mg/dL — ABNORMAL HIGH (ref 0.76–1.27)
Globulin, Total: 2.6 g/dL (ref 1.5–4.5)
Glucose: 111 mg/dL — ABNORMAL HIGH (ref 70–99)
Potassium: 4 mmol/L (ref 3.5–5.2)
Sodium: 143 mmol/L (ref 134–144)
Total Protein: 7 g/dL (ref 6.0–8.5)
eGFR: 56 mL/min/1.73 — ABNORMAL LOW

## 2024-09-12 LAB — LIPID PANEL
Chol/HDL Ratio: 3.3 ratio (ref 0.0–5.0)
Cholesterol, Total: 112 mg/dL (ref 100–199)
HDL: 34 mg/dL — ABNORMAL LOW
LDL Chol Calc (NIH): 50 mg/dL (ref 0–99)
Triglycerides: 169 mg/dL — ABNORMAL HIGH (ref 0–149)
VLDL Cholesterol Cal: 28 mg/dL (ref 5–40)

## 2024-09-12 NOTE — Telephone Encounter (Signed)
 Spoke with pt. Pt was notified of lab results and recommendations. Please schedule apt with EP per Saddie Cleaves NP Thank you.

## 2024-09-15 MED ORDER — NITROGLYCERIN 0.4 MG SL SUBL: 0.4000 mg | SUBLINGUAL_TABLET | SUBLINGUAL | 2 refills | Status: AC | PRN

## 2024-09-18 ENCOUNTER — Encounter: Payer: Self-pay | Admitting: Family Medicine

## 2024-09-18 ENCOUNTER — Ambulatory Visit: Admitting: Family Medicine

## 2024-09-18 VITALS — BP 111/72 | HR 89 | Temp 98.4°F | Ht 75.0 in | Wt 263.0 lb

## 2024-09-18 DIAGNOSIS — Z7984 Long term (current) use of oral hypoglycemic drugs: Secondary | ICD-10-CM

## 2024-09-18 DIAGNOSIS — Z6832 Body mass index (BMI) 32.0-32.9, adult: Secondary | ICD-10-CM

## 2024-09-18 DIAGNOSIS — J410 Simple chronic bronchitis: Secondary | ICD-10-CM | POA: Diagnosis not present

## 2024-09-18 DIAGNOSIS — E782 Mixed hyperlipidemia: Secondary | ICD-10-CM | POA: Diagnosis not present

## 2024-09-18 DIAGNOSIS — L603 Nail dystrophy: Secondary | ICD-10-CM

## 2024-09-18 DIAGNOSIS — I5032 Chronic diastolic (congestive) heart failure: Secondary | ICD-10-CM

## 2024-09-18 DIAGNOSIS — E1122 Type 2 diabetes mellitus with diabetic chronic kidney disease: Secondary | ICD-10-CM | POA: Diagnosis not present

## 2024-09-18 DIAGNOSIS — N1831 Chronic kidney disease, stage 3a: Secondary | ICD-10-CM

## 2024-09-18 DIAGNOSIS — E6609 Other obesity due to excess calories: Secondary | ICD-10-CM

## 2024-09-18 DIAGNOSIS — I483 Typical atrial flutter: Secondary | ICD-10-CM | POA: Diagnosis not present

## 2024-09-18 DIAGNOSIS — E66811 Obesity, class 1: Secondary | ICD-10-CM | POA: Diagnosis not present

## 2024-09-18 DIAGNOSIS — I130 Hypertensive heart and chronic kidney disease with heart failure and stage 1 through stage 4 chronic kidney disease, or unspecified chronic kidney disease: Secondary | ICD-10-CM

## 2024-09-18 DIAGNOSIS — I10 Essential (primary) hypertension: Secondary | ICD-10-CM

## 2024-09-18 LAB — POCT GLYCOSYLATED HEMOGLOBIN (HGB A1C): Hemoglobin A1C: 6.2 % — AB (ref 4.0–5.6)

## 2024-09-18 MED ORDER — ROSUVASTATIN CALCIUM 20 MG PO TABS: 20.0000 mg | ORAL_TABLET | Freq: Every day | ORAL | 3 refills | Status: AC

## 2024-09-18 NOTE — Progress Notes (Signed)
 " Assessment & Plan   Assessment/Plan:    Assessment & Plan Chronic heart failure with preserved ejection fraction No recent exacerbations. Blood pressure is well controlled at 111/72 mmHg. No chest pain or significant shortness of breath reported. Weight loss has contributed to improved symptoms. - Continue torsemide  20 mg daily - Continue follow-up with cardiology  Typical atrial flutter Managed with rate control and anticoagulation. Reports increased episodes of atrial flutter but no significant symptoms. Previous ablation was incomplete due to anatomical challenges. Weight loss and current medication regimen are beneficial. - Continue Eliquis  for anticoagulation - Continue diltiazem  for rate control - Continue follow-up with cardiology  Type 2 diabetes mellitus with stage 3a chronic kidney disease Type 2 diabetes is well-controlled with an A1c of 6.2%. Stage 3a chronic kidney disease is stable with a GFR of 56, slightly decreased from 60 six months ago. No significant symptoms reported. - Continue Rybelsus  14 mg daily - Continue metformin  750 mg twice daily   Primary hypertension Well-controlled with current medication regimen. Blood pressure is 111/72 mmHg. - Continue diltiazem  for blood pressure control  Mixed hyperlipidemia Managed with rosuvastatin  20 mg daily. Recent refill issues resolved with a 30-day supply provided by cardiology. - Sent 90-day supply of rosuvastatin  to [employer]  Class 1 obesity Recent weight loss of approximately 15 pounds. Weight is stable around 256-263 pounds. Encouraged to continue weight management efforts. - Recommended heart-healthy diet and cardiovascular exercise  Simple chronic bronchitis Managed with albuterol  as needed. No recent exacerbations or wheezing reported. Occasional shortness of breath noted. - Continue albuterol  as needed  Onychodystrophy Reports painful toenails with possible ingrown toenails. Previous podiatrist visit  was unsatisfactory. Interested in seeing a different podiatrist. - Will refer to preferred podiatrist for evaluation and management        Medications Discontinued During This Encounter  Medication Reason   rosuvastatin  (CRESTOR ) 20 MG tablet Reorder    follow-up in 3 months for diabetes         Subjective:   Encounter date: 09/18/2024  Richard Vazquez is a 69 y.o. male who has Atrial flutter (HCC); Hyperlipemia; Hematuria; Carotid artery disease; Persistent atrial fibrillation (HCC); CAD (coronary artery disease); Shift work sleep disorder; OSA (obstructive sleep apnea); Class 2 severe obesity due to excess calories with serious comorbidity and body mass index (BMI) of 35.0 to 35.9 in adult; Renal stones; Prediabetes; Chronic constipation; Rash; Dyspnea on exertion; Bilateral edema of lower extremity; Vitamin D  deficiency; Psoriasis; Venous stasis of both lower extremities; Varicose veins of left lower extremity with pain; Absolute anemia; Benign prostatic hyperplasia with urinary frequency; Primary hypertension; Type 2 diabetes mellitus with hyperglycemia, without long-term current use of insulin  (HCC); Hypokalemia; Simple chronic bronchitis (HCC); Chronic heart failure with preserved ejection fraction (HCC); Class 1 obesity due to excess calories with serious comorbidity and body mass index (BMI) of 32.0 to 32.9 in adult; and Type 2 diabetes mellitus with stage 3a chronic kidney disease, without long-term current use of insulin  (HCC) on their problem list..   He  has a past medical history of Acute pain of right knee (05/16/2022), Arrhythmia, Atrial fibrillation (HCC), Carotid artery disease, Colon polyps, COPD (chronic obstructive pulmonary disease) (HCC), Former smoker (12/21/2015), Hyperlipemia, Hypertension (Not sure), Kidney stones, Left carotid bruit (08/05/2021), Obesity, Paroxysmal atrial flutter (HCC) (05/29/2021), Sleep apnea (February 2023), and Sleep apnea treated with nocturnal  bilevel positive airway pressure (BPAP).SABRA   He presents with chief complaint of Medical Management of Chronic Issues (Pt presnts today for his  BP, HLD And DM follow up. No specific questions or concerns. ) .   Discussed the use of AI scribe software for clinical note transcription with the patient, who gave verbal consent to proceed.  History of Present Illness Richard Vazquez is a 69 year old male with atrial flutter and type 2 diabetes who presents for routine follow-up.  Glycemic variability and gastrointestinal symptoms - Fluctuating blood glucose levels with current A1c of 6.2%. - Diabetes regimen includes Rybelsus  14 mg and metformin  750 mg twice daily. - Concurrent administration of Rybelsus  and metformin  causes gastrointestinal upset; spacing doses improves symptoms. - Occasional constipation and diarrhea, attributed to dietary factors.  Cardiac arrhythmia and dyspnea - History of atrial flutter; currently using a Zio monitor for rhythm assessment. - Smartwatch has recorded episodes of atrial fibrillation over the past few months. - Previous ablation procedure did not address all arrhythmogenic foci. - Shortness of breath, particularly with stair climbing, improved with weight loss. - Medications include Eliquis  and diltiazem .  Weight fluctuation - Weight fluctuates between 256 to 262 pounds. - Sustained weight loss of approximately 15 pounds over several months. - Attributes some health improvements to weight loss.  Chronic bronchitis and respiratory symptoms - History of chronic bronchitis, managed with albuterol  as needed. - No recent wheezing. - Intermittent shortness of breath persists.  Renal function and nephrolithiasis - History of kidney stones. - Recent decrease in GFR from 60 to 56 over six months. - Concerned about kidney function; scheduled to see urologist in March.  Onychodystrophy and foot pain - Painful and abnormally growing toenails. - Seeking new  podiatrist after unsatisfactory prior experience.  Obstructive sleep apnea - Uses BiPAP machine for sleep apnea.     ROS  Past Surgical History:  Procedure Laterality Date   A-FLUTTER ABLATION N/A 01/16/2022   Procedure: A-FLUTTER ABLATION;  Surgeon: Fernande Elspeth BROCKS, MD;  Location: [facility] INVASIVE CV LAB;  Service: Cardiovascular;  Laterality: N/A;   A-FLUTTER ABLATION N/A 01/25/2024   Procedure: A-FLUTTER ABLATION;  Surgeon: Nancey Eulas BRAVO, MD;  Location: [facility] INVASIVE CV LAB;  Service: Cardiovascular;  Laterality: N/A;   APPENDECTOMY  1971   ATRIAL FIBRILLATION ABLATION N/A 01/25/2024   Procedure: ATRIAL FIBRILLATION ABLATION;  Surgeon: Nancey Eulas BRAVO, MD;  Location: [facility] INVASIVE CV LAB;  Service: Cardiovascular;  Laterality: N/A;   CARDIOVERSION N/A 05/31/2021   Procedure: CARDIOVERSION;  Surgeon: Mona Vinie BROCKS, MD;  Location: [facility] ENDOSCOPY;  Service: Cardiovascular;  Laterality: N/A;   CARDIOVERSION N/A 10/05/2021   Procedure: CARDIOVERSION;  Surgeon: Mona Vinie BROCKS, MD;  Location: [facility] ENDOSCOPY;  Service: Cardiovascular;  Laterality: N/A;   CARDIOVERSION N/A 11/01/2023   Procedure: CARDIOVERSION;  Surgeon: Santo Stanly LABOR, MD;  Location: [facility] INVASIVE CV LAB;  Service: Cardiovascular;  Laterality: N/A;   COLONOSCOPY     LEFT HEART CATH AND CORONARY ANGIOGRAPHY N/A 12/07/2021   Procedure: LEFT HEART CATH AND CORONARY ANGIOGRAPHY;  Surgeon: Wendel Lurena POUR, MD;  Location: [facility] INVASIVE CV LAB;  Service: Cardiovascular;  Laterality: N/A;   LITHOTRIPSY     multiple   TEE WITHOUT CARDIOVERSION N/A 05/31/2021   Procedure: TRANSESOPHAGEAL ECHOCARDIOGRAM (TEE);  Surgeon: Mona Vinie BROCKS, MD;  Location: [facility] ENDOSCOPY;  Service: Cardiovascular;  Laterality: N/A;    Outpatient Medications Prior to Visit  Medication Sig Dispense Refill   albuterol  (VENTOLIN  HFA) 108 (90 Base) MCG/ACT inhaler Inhale 2 puffs into the lungs every 6 (six) hours as needed for wheezing or shortness of  breath. 8 g 0  apixaban  (ELIQUIS ) 5 MG TABS tablet TAKE 1 TABLET BY MOUTH 2 TIMES A DAY 180 tablet 1   bisacodyl  (DULCOLAX) 5 MG EC tablet Take 5 mg by mouth daily as needed for moderate constipation or severe constipation. (Patient taking differently: Take 5 mg by mouth as needed for moderate constipation or severe constipation.)     CAPVAXIVE 0.5 ML injection      cholecalciferol (VITAMIN D3) 25 MCG (1000 UNIT) tablet Take 1,000 Units by mouth daily.     diltiazem  (CARDIZEM  CD) 240 MG 24 hr capsule Take 1 capsule (240 mg total) by mouth daily. 90 capsule 2   indapamide  (LOZOL ) 2.5 MG tablet Take 2.5 mg by mouth daily.     Iron, Ferrous Sulfate , 325 (65 Fe) MG TABS Take 65 mg by mouth every other day.     metFORMIN  (GLUCOPHAGE -XR) 750 MG 24 hr tablet Take 1 tablet (750 mg total) by mouth every evening. (Patient taking differently: Take 750 mg by mouth in the morning and at bedtime.) 90 tablet 3   Multiple Vitamin (MULTIVITAMIN) tablet Take 1 tablet by mouth daily.     nitroGLYCERIN  (NITROSTAT ) 0.4 MG SL tablet Place 1 tablet (0.4 mg total) under the tongue every 5 (five) minutes as needed for chest pain (Max 3 doses). 25 tablet 2   Omega-3 1000 MG CAPS Take 1,000 mg by mouth daily.     polyethylene glycol (MIRALAX  / GLYCOLAX ) 17 g packet Take 17 g by mouth 2 (two) times daily. 180 packet 3   potassium chloride  SA (KLOR-CON  M) 20 MEQ tablet Take 1 tablet (20 mEq total) by mouth 2 (two) times daily. 180 tablet 2   Semaglutide  14 MG TABS Take 1 tablet (14 mg total) by mouth daily before breakfast. 90 tablet 3   tamsulosin (FLOMAX) 0.4 MG CAPS capsule Take 0.4 mg by mouth daily.     torsemide  (DEMADEX ) 20 MG tablet Take 1 tablet (20 mg total) by mouth daily. 90 tablet 3   triamcinolone  cream (KENALOG ) 0.1 % Apply 1 Application topically 2 (two) times daily. 30 g 0   rosuvastatin  (CRESTOR ) 20 MG tablet TAKE 1 TABLET BY MOUTH DAILY 30 tablet 0   No facility-administered medications prior to visit.     Family History  Problem Relation Age of Onset   Diabetes Mother    Cancer Mother    Heart disease Mother    Hyperlipidemia Mother    Hypertension Mother    Miscarriages / Stillbirths Mother    Stroke Mother    Varicose Veins Mother    Heart disease Father        smoker   Aortic stenosis Father    Hyperlipidemia Father    Hypertension Father    Stroke Father    Ovarian cancer Sister    Colon cancer Paternal Uncle    Aneurysm Paternal Uncle        Brain   Cancer Paternal Uncle        all in abdomin, ? colon cancer   Heart failure Paternal Grandmother    Asthma Sister    Cancer Paternal Uncle    Early death Paternal Aunt    Early death Paternal Uncle    Stomach cancer Neg Hx    Rectal cancer Neg Hx     Social History   Socioeconomic History   Marital status: Married    Spouse name: Not on file   Number of children: 2   Years of education: Not on file  Highest education level: 12th grade  Occupational History   Occupation: retired  Tobacco Use   Smoking status: Former    Current packs/day: 0.00    Average packs/day: 1.5 packs/day for 40.0 years (60.0 ttl pk-yrs)    Types: Cigarettes    Start date: 12/09/1979    Quit date: 12/09/2019    Years since quitting: 4.7    Passive exposure: Past   Smokeless tobacco: Never  Vaping Use   Vaping status: Never Used  Substance and Sexual Activity   Alcohol use: Not Currently   Drug use: Never   Sexual activity: Not Currently  Other Topics Concern   Not on file  Social History Narrative   Not on file   Social Drivers of Health   Tobacco Use: Medium Risk (09/11/2024)   Patient History    Smoking Tobacco Use: Former    Smokeless Tobacco Use: Never    Passive Exposure: Past  Physicist, Medical Strain: Low Risk (06/11/2024)   Overall Financial Resource Strain (CARDIA)    Difficulty of Paying Living Expenses: Not hard at all  Food Insecurity: No Food Insecurity (06/11/2024)   Epic    Worried About Brewing Technologist in the Last Year: Never true    Ran Out of Food in the Last Year: Never true  Transportation Needs: No Transportation Needs (06/11/2024)   Epic    Lack of Transportation (Medical): No    Lack of Transportation (Non-Medical): No  Physical Activity: Unknown (06/11/2024)   Exercise Vital Sign    Days of Exercise per Week: Patient declined    Minutes of Exercise per Session: Not on file  Recent Concern: Physical Activity - Insufficiently Active (04/18/2024)   Exercise Vital Sign    Days of Exercise per Week: 3 days    Minutes of Exercise per Session: 30 min  Stress: No Stress Concern Present (06/11/2024)   [employer] of Occupational Health - Occupational Stress Questionnaire    Feeling of Stress: Only a little  Social Connections: Unknown (06/11/2024)   Social Connection and Isolation Panel    Frequency of Communication with Friends and Family: Never    Frequency of Social Gatherings with Friends and Family: Patient declined    Attends Religious Services: Never    Database Administrator or Organizations: No    Attends Engineer, Structural: Not on file    Marital Status: Married  Catering Manager Violence: Not At Risk (04/18/2024)   Epic    Fear of Current or Ex-Partner: No    Emotionally Abused: No    Physically Abused: No    Sexually Abused: No  Depression (PHQ2-9): Low Risk (09/18/2024)   Depression (PHQ2-9)    PHQ-2 Score: 0  Alcohol Screen: Low Risk (04/18/2024)   Alcohol Screen    Last Alcohol Screening Score (AUDIT): 0  Housing: Low Risk (06/11/2024)   Epic    Unable to Pay for Housing in the Last Year: No    Number of Times Moved in the Last Year: 0    Homeless in the Last Year: No  Utilities: Not At Risk (04/18/2024)   Epic    Threatened with loss of utilities: No  Health Literacy: Adequate Health Literacy (04/18/2024)   B1300 Health Literacy    Frequency of need for help with medical instructions: Never  Objective:  Physical Exam: BP 111/72   Pulse 89   Temp 98.4 F (36.9 C)   Ht 6' 3 (1.905 m)   Wt 263 lb (119.3 kg)   SpO2 96%   BMI 32.87 kg/m    Physical Exam VITALS: BP- 111/72 MEASUREMENTS: Weight- 256-262 lbs, BMI- 32.9. GENERAL: Alert, cooperative, well developed, no acute distress. HEENT: Normocephalic, normal oropharynx, moist mucous membranes. CHEST: Clear to auscultation bilaterally, no wheezes, rhonchi, or crackles. CARDIOVASCULAR: Normal heart rate and rhythm, S1 and S2 normal without murmurs. ABDOMEN: Soft, non-tender, non-distended, without organomegaly, normal bowel sounds. EXTREMITIES: No cyanosis or edema. NEUROLOGICAL: Cranial nerves grossly intact, moves all extremities without gross motor or sensory deficit.   Physical Exam  No results found.  Recent Results (from the past 2160 hours)  Basic metabolic panel with GFR     Status: Abnormal   Collection Time: 06/24/24  9:56 AM  Result Value Ref Range   Sodium 138 135 - 145 mEq/L   Potassium 3.6 3.5 - 5.1 mEq/L   Chloride 101 96 - 112 mEq/L   CO2 25 19 - 32 mEq/L    Comment: Elevated LDH levels may cause falsely increased CO2 results. If LDH is >2000 U/L, a positive bias of 12% is possible.   Glucose, Bld 120 (H) 70 - 99 mg/dL   BUN 17 6 - 23 mg/dL   Creatinine, Ser 8.87 0.40 - 1.50 mg/dL   GFR 32.50 >39.99 mL/min    Comment: Calculated using the CKD-EPI Creatinine Equation (2021)   Calcium  9.2 8.4 - 10.5 mg/dL  OPHTHALMOLOGY REPORT-SCANNED     Status: None   Collection Time: 07/15/24  1:10 PM  Result Value Ref Range   HM Diabetic Eye Exam No Retinopathy No Retinopathy    Comment: Abstracted by HIM   A Comment    CBC     Status: None   Collection Time: 09/11/24 10:41 AM  Result Value Ref Range   WBC 8.7 3.4 - 10.8 x10E3/uL   RBC 4.51 4.14 - 5.80 x10E6/uL   Hemoglobin 13.5 13.0 - 17.7 g/dL   Hematocrit 59.5 62.4 - 51.0 %   MCV 90 79 - 97 fL    MCH 29.9 26.6 - 33.0 pg   MCHC 33.4 31.5 - 35.7 g/dL   RDW 85.6 88.3 - 84.5 %   Platelets 268 150 - 450 x10E3/uL  Comprehensive metabolic panel with GFR     Status: Abnormal   Collection Time: 09/11/24 10:41 AM  Result Value Ref Range   Glucose 111 (H) 70 - 99 mg/dL   BUN 18 8 - 27 mg/dL   Creatinine, Ser 8.62 (H) 0.76 - 1.27 mg/dL   eGFR 56 (L) >40 fO/fpw/8.26   BUN/Creatinine Ratio 13 10 - 24   Sodium 143 134 - 144 mmol/L   Potassium 4.0 3.5 - 5.2 mmol/L   Chloride 100 96 - 106 mmol/L   CO2 25 20 - 29 mmol/L   Calcium  9.5 8.6 - 10.2 mg/dL   Total Protein 7.0 6.0 - 8.5 g/dL   Albumin 4.4 3.9 - 4.9 g/dL   Globulin, Total 2.6 1.5 - 4.5 g/dL   Bilirubin Total 0.4 0.0 - 1.2 mg/dL   Alkaline Phosphatase 67 47 - 123 IU/L   AST 19 0 - 40 IU/L   ALT 13 0 - 44 IU/L  Lipid panel     Status: Abnormal   Collection Time: 09/11/24 10:41 AM  Result Value Ref Range   Cholesterol, Total 112 100 -  199 mg/dL   Triglycerides 830 (H) 0 - 149 mg/dL   HDL 34 (L) >60 mg/dL   VLDL Cholesterol Cal 28 5 - 40 mg/dL   LDL Chol Calc (NIH) 50 0 - 99 mg/dL   Chol/HDL Ratio 3.3 0.0 - 5.0 ratio    Comment:                                   T. Chol/HDL Ratio                                             Men  Women                               1/2 Avg.Risk  3.4    3.3                                   Avg.Risk  5.0    4.4                                2X Avg.Risk  9.6    7.1                                3X Avg.Risk 23.4   11.0   POCT HgB A1C     Status: Abnormal   Collection Time: 09/18/24  9:08 AM  Result Value Ref Range   Hemoglobin A1C 6.2 (A) 4.0 - 5.6 %   HbA1c POC (<> result, manual entry)     HbA1c, POC (prediabetic range)     HbA1c, POC (controlled diabetic range)          Beverley Adine Hummer, MD, MS "

## 2024-09-19 ENCOUNTER — Other Ambulatory Visit: Payer: Self-pay

## 2024-09-19 DIAGNOSIS — N1831 Chronic kidney disease, stage 3a: Secondary | ICD-10-CM

## 2024-09-20 DIAGNOSIS — E1122 Type 2 diabetes mellitus with diabetic chronic kidney disease: Secondary | ICD-10-CM | POA: Insufficient documentation

## 2024-09-20 DIAGNOSIS — L603 Nail dystrophy: Secondary | ICD-10-CM | POA: Insufficient documentation

## 2024-09-20 DIAGNOSIS — E6609 Other obesity due to excess calories: Secondary | ICD-10-CM | POA: Insufficient documentation

## 2024-09-20 DIAGNOSIS — I5032 Chronic diastolic (congestive) heart failure: Secondary | ICD-10-CM | POA: Insufficient documentation

## 2024-10-02 ENCOUNTER — Telehealth (HOSPITAL_BASED_OUTPATIENT_CLINIC_OR_DEPARTMENT_OTHER): Payer: Self-pay

## 2024-10-02 NOTE — Telephone Encounter (Signed)
"  ° °  Pre-operative Risk Assessment    Patient Name: Richard Vazquez  DOB: [birth year] MRN: [id number]   Date of last office visit: 09/11/2024 with Saddie Cleaves, NP Date of next office visit: 11/11/2024 with Charlies Arthur, PA-C  Request for Surgical Clearance    Procedure:  Laser lithotripsy, stent  Date of Surgery:  Clearance 10/27/24                                 Surgeon:  Dr. Deward Stalling Surgeon's Group or Practice Name:  [facility]  Phone number:  [phone] Fax number:  [phone]   Type of Clearance Requested:   - Medical  - Pharmacy:  Hold Apixaban  (Eliquis ) 72 hours   Type of Anesthesia:  Not Indicated   Additional requests/questions:  None  SignedPatrcia Iverson CROME   10/02/2024, 2:40 PM   "

## 2024-10-03 ENCOUNTER — Encounter: Payer: Self-pay | Admitting: Podiatry

## 2024-10-03 ENCOUNTER — Ambulatory Visit: Admitting: Podiatry

## 2024-10-03 DIAGNOSIS — E1142 Type 2 diabetes mellitus with diabetic polyneuropathy: Secondary | ICD-10-CM

## 2024-10-03 DIAGNOSIS — B351 Tinea unguium: Secondary | ICD-10-CM

## 2024-10-03 NOTE — Progress Notes (Signed)
 "  Subjective:  Patient ID: Richard Vazquez, male    DOB: [birth year],   MRN: [id number]  Chief Complaint  Patient presents with   Diabetes    Look at my big toenails.  They're giving me a problem.  One looks like a crooked claw and the other looks like it has a nail growing under the nail.  Saw Dr. Sebastian - 09/18/2024; A1c - 6.2    69 y.o. male presents for concern of bilateral great toes.  He relates they have been painful thickened and dystrophic and unable to trim them himself.  Requesting  to have them trimmed today. Denies burning and tingling in their feet. Patient is diabetic and last A1c was  Lab Results  Component Value Date   HGBA1C 6.2 (A) 09/18/2024   .   PCP:  Sebastian Beverley NOVAK, MD   . Denies any other pedal complaints. Denies n/v/f/c.   Past Medical History:  Diagnosis Date   Acute pain of right knee 05/16/2022   Arrhythmia    Atrial fibrillation (HCC)    Carotid artery disease    Carotid US  09/01/2022: R 40-59, L 1-39   Colon polyps    COPD (chronic obstructive pulmonary disease) (HCC)    Former smoker 12/21/2015   Hyperlipemia    Hypertension Not sure   May have started when Atrial flutter began   Kidney stones    Left carotid bruit 08/05/2021   Obesity    Paroxysmal atrial flutter (HCC) 05/29/2021   Sleep apnea February 2023   bipap   Sleep apnea treated with nocturnal bilevel positive airway pressure (BPAP)     Objective:  Physical Exam: Vascular: DP/PT pulses 2/4 bilateral. CFT <3 seconds. Feet cold to touch. Absent hair growth on digits. Edema noted to bilateral lower extremities. Xerosis noted bilaterally.  Skin. No lacerations or abrasions bilateral feet. Nails 1-5 bilateral  are thickened discolored and elongated with subungual debris.  Tender to bilateral hallux nails with particular dystrophic changes noted and incurvation of medial border of left hallux.  Discoloration and debris noted Musculoskeletal: MMT 5/5 bilateral lower extremities in DF, PF,  Inversion and Eversion. Deceased ROM in DF of ankle joint.  Neurological: Sensation intact to light touch. Protective sensation diminished bilateral.    Assessment:   1. Pain due to onychomycosis of toenails of both feet   2. Type 2 diabetes mellitus with peripheral neuropathy (HCC)   3. Onychomycosis      Plan:  Patient was evaluated and treated and all questions answered. -Discussed and educated patient on diabetic foot care, especially with  regards to the vascular, neurological and musculoskeletal systems.  -Stressed the importance of good glycemic control and the detriment of not  controlling glucose levels in relation to the foot. -Discussed supportive shoes at all times and checking feet regularly.  -Mechanically debrided all nails 1-5 bilateral using sterile nail nipper and filed with dremel without incident  Incurvated area on left hallux upon removal small area of bleeding noted.  Covered with Neosporin and a Band-Aid and given precautions. -Answered all patient questions -Patient advised to call the office if any problems or questions arise in the meantime. -Examined patient -Discussed treatment options for painful dystrophic nails  -Clinical picture and Fungal culture was obtained by removing a portion of the hard nail itself from each of the involved toenails using a sterile nail nipper and sent to [facility] lab. Patient tolerated the biopsy procedure well without discomfort or need for anesthesia.  -Discussed fungal  nail treatment options including oral, topical, and laser treatments.  -Patient to return in 4 weeks for follow up evaluation and discussion of fungal culture results or sooner if symptoms worsen.   [name], DPM    "

## 2024-10-03 NOTE — Addendum Note (Signed)
 Addended by: WAYLAN ELIDIA PARAS on: 10/03/2024 11:38 AM   Modules accepted: Orders

## 2024-10-08 ENCOUNTER — Ambulatory Visit: Admitting: Cardiology

## 2024-11-07 ENCOUNTER — Ambulatory Visit: Admitting: Podiatry

## 2024-11-11 ENCOUNTER — Ambulatory Visit: Admitting: Physician Assistant

## 2024-11-11 ENCOUNTER — Other Ambulatory Visit

## 2025-02-06 ENCOUNTER — Ambulatory Visit: Admitting: Internal Medicine

## 2025-04-24 ENCOUNTER — Ambulatory Visit
